# Patient Record
Sex: Male | Born: 1950 | Race: White | Hispanic: No | Marital: Married | Smoking: Never smoker
Health system: Southern US, Community
[De-identification: ages and names within clinical notes are randomized; demographics above are authoritative.]

## PROBLEM LIST (undated history)

## (undated) DIAGNOSIS — B019 Varicella without complication: Secondary | ICD-10-CM

## (undated) DIAGNOSIS — K635 Polyp of colon: Secondary | ICD-10-CM

## (undated) DIAGNOSIS — Z85828 Personal history of other malignant neoplasm of skin: Secondary | ICD-10-CM

## (undated) DIAGNOSIS — C801 Malignant (primary) neoplasm, unspecified: Secondary | ICD-10-CM

## (undated) HISTORY — DX: Varicella without complication: B01.9

## (undated) HISTORY — PX: CIRCUMCISION: SUR203

## (undated) HISTORY — DX: Personal history of other malignant neoplasm of skin: Z85.828

## (undated) HISTORY — DX: Polyp of colon: K63.5

## (undated) HISTORY — PX: APPENDECTOMY: SHX54

## (undated) HISTORY — PX: COLONOSCOPY: SHX174

---

## 2015-08-26 DIAGNOSIS — Z86010 Personal history of colonic polyps: Secondary | ICD-10-CM | POA: Diagnosis not present

## 2015-08-26 DIAGNOSIS — D170 Benign lipomatous neoplasm of skin and subcutaneous tissue of head, face and neck: Secondary | ICD-10-CM | POA: Diagnosis not present

## 2015-08-26 DIAGNOSIS — M62830 Muscle spasm of back: Secondary | ICD-10-CM | POA: Diagnosis not present

## 2015-08-26 DIAGNOSIS — Z8589 Personal history of malignant neoplasm of other organs and systems: Secondary | ICD-10-CM | POA: Diagnosis not present

## 2015-08-26 DIAGNOSIS — A630 Anogenital (venereal) warts: Secondary | ICD-10-CM | POA: Diagnosis not present

## 2015-08-27 DIAGNOSIS — R7989 Other specified abnormal findings of blood chemistry: Secondary | ICD-10-CM | POA: Diagnosis not present

## 2015-08-27 DIAGNOSIS — Z125 Encounter for screening for malignant neoplasm of prostate: Secondary | ICD-10-CM | POA: Diagnosis not present

## 2015-08-27 DIAGNOSIS — E756 Lipid storage disorder, unspecified: Secondary | ICD-10-CM | POA: Diagnosis not present

## 2015-08-27 DIAGNOSIS — Z0000 Encounter for general adult medical examination without abnormal findings: Secondary | ICD-10-CM | POA: Diagnosis not present

## 2015-08-27 DIAGNOSIS — R946 Abnormal results of thyroid function studies: Secondary | ICD-10-CM | POA: Diagnosis not present

## 2015-09-02 DIAGNOSIS — Z0000 Encounter for general adult medical examination without abnormal findings: Secondary | ICD-10-CM | POA: Diagnosis not present

## 2015-09-02 DIAGNOSIS — M62830 Muscle spasm of back: Secondary | ICD-10-CM | POA: Diagnosis not present

## 2015-09-02 DIAGNOSIS — A630 Anogenital (venereal) warts: Secondary | ICD-10-CM | POA: Diagnosis not present

## 2015-09-10 DIAGNOSIS — M545 Low back pain: Secondary | ICD-10-CM | POA: Diagnosis not present

## 2015-09-10 DIAGNOSIS — G8929 Other chronic pain: Secondary | ICD-10-CM | POA: Diagnosis not present

## 2015-09-10 DIAGNOSIS — Z13820 Encounter for screening for osteoporosis: Secondary | ICD-10-CM | POA: Diagnosis not present

## 2015-09-10 DIAGNOSIS — M542 Cervicalgia: Secondary | ICD-10-CM | POA: Diagnosis not present

## 2015-09-10 DIAGNOSIS — M129 Arthropathy, unspecified: Secondary | ICD-10-CM | POA: Diagnosis not present

## 2015-09-17 DIAGNOSIS — M539 Dorsopathy, unspecified: Secondary | ICD-10-CM | POA: Diagnosis not present

## 2015-09-17 DIAGNOSIS — R030 Elevated blood-pressure reading, without diagnosis of hypertension: Secondary | ICD-10-CM | POA: Diagnosis not present

## 2015-11-02 DIAGNOSIS — S0181XA Laceration without foreign body of other part of head, initial encounter: Secondary | ICD-10-CM | POA: Diagnosis not present

## 2015-11-11 DIAGNOSIS — S0181XD Laceration without foreign body of other part of head, subsequent encounter: Secondary | ICD-10-CM | POA: Diagnosis not present

## 2015-11-11 DIAGNOSIS — Z4802 Encounter for removal of sutures: Secondary | ICD-10-CM | POA: Diagnosis not present

## 2016-01-30 DIAGNOSIS — D485 Neoplasm of uncertain behavior of skin: Secondary | ICD-10-CM | POA: Diagnosis not present

## 2016-02-25 DIAGNOSIS — L821 Other seborrheic keratosis: Secondary | ICD-10-CM | POA: Diagnosis not present

## 2016-02-25 DIAGNOSIS — L570 Actinic keratosis: Secondary | ICD-10-CM | POA: Diagnosis not present

## 2016-03-07 DIAGNOSIS — R002 Palpitations: Secondary | ICD-10-CM | POA: Diagnosis not present

## 2016-03-07 DIAGNOSIS — I10 Essential (primary) hypertension: Secondary | ICD-10-CM | POA: Diagnosis not present

## 2016-03-07 DIAGNOSIS — Z79899 Other long term (current) drug therapy: Secondary | ICD-10-CM | POA: Diagnosis not present

## 2016-03-07 DIAGNOSIS — R0789 Other chest pain: Secondary | ICD-10-CM | POA: Diagnosis not present

## 2016-03-07 DIAGNOSIS — Z8249 Family history of ischemic heart disease and other diseases of the circulatory system: Secondary | ICD-10-CM | POA: Diagnosis not present

## 2016-03-07 DIAGNOSIS — R079 Chest pain, unspecified: Secondary | ICD-10-CM | POA: Diagnosis not present

## 2016-03-08 DIAGNOSIS — R002 Palpitations: Secondary | ICD-10-CM | POA: Diagnosis not present

## 2016-03-08 DIAGNOSIS — R079 Chest pain, unspecified: Secondary | ICD-10-CM | POA: Diagnosis not present

## 2016-03-08 DIAGNOSIS — I10 Essential (primary) hypertension: Secondary | ICD-10-CM | POA: Diagnosis not present

## 2016-10-30 DIAGNOSIS — L309 Dermatitis, unspecified: Secondary | ICD-10-CM | POA: Diagnosis not present

## 2017-01-18 ENCOUNTER — Ambulatory Visit (INDEPENDENT_AMBULATORY_CARE_PROVIDER_SITE_OTHER): Payer: PPO | Admitting: Family Medicine

## 2017-01-18 ENCOUNTER — Encounter: Payer: Self-pay | Admitting: Family Medicine

## 2017-01-18 VITALS — BP 132/60 | HR 70 | Temp 98.4°F | Resp 16 | Ht 72.0 in | Wt 233.6 lb

## 2017-01-18 DIAGNOSIS — Z86010 Personal history of colonic polyps: Secondary | ICD-10-CM | POA: Diagnosis not present

## 2017-01-18 DIAGNOSIS — K5900 Constipation, unspecified: Secondary | ICD-10-CM

## 2017-01-18 DIAGNOSIS — R1310 Dysphagia, unspecified: Secondary | ICD-10-CM | POA: Diagnosis not present

## 2017-01-18 NOTE — Patient Instructions (Addendum)
If you do not hear anything about your referral in the next 1-2 weeks, call our office and ask for an update.  Check when you had your last physical and give [place] a call when you are sure it has been a year.   Pneumococcal Conjugate Vaccine (PCV13) What You Need to Know 1. Why get vaccinated? Vaccination can protect both children and adults from pneumococcal disease. Pneumococcal disease is caused by bacteria that can spread from person to person through close contact. It can cause ear infections, and it can also lead to more serious infections of the:  Lungs (pneumonia),  Blood (bacteremia), and  Covering of the brain and spinal cord (meningitis).  Pneumococcal pneumonia is most common among adults. Pneumococcal meningitis can cause deafness and brain damage, and it kills about 1 child in 10 who get it. Anyone can get pneumococcal disease, but children under 73 years of age and adults 11 years and older, people with certain medical conditions, and cigarette smokers are at the highest risk. Before there was a vaccine, the [place] saw:  more than 700 cases of meningitis,  about 13,000 blood infections,  about 5 million ear infections, and  about 200 deaths  in children under 5 each year from pneumococcal disease. Since vaccine became available, severe pneumococcal disease in these children has fallen by 88%. About 18,000 older adults die of pneumococcal disease each year in the [place]. Treatment of pneumococcal infections with penicillin and other drugs is not as effective as it used to be, because some strains of the disease have become resistant to these drugs. This makes prevention of the disease, through vaccination, even more important. 2. PCV13 vaccine Pneumococcal conjugate vaccine (called PCV13) protects against 13 types of pneumococcal bacteria. PCV13 is routinely given to children at 2, 4, 6, and 64-21 months of age. It is also recommended for children and adults 25  to 5 years of age with certain health conditions, and for all adults 46 years of age and older. Your doctor can give you details. 3. Some people should not get this vaccine Anyone who has ever had a life-threatening allergic reaction to a dose of this vaccine, to an earlier pneumococcal vaccine called PCV7, or to any vaccine containing diphtheria toxoid (for example, DTaP), should not get PCV13. Anyone with a severe allergy to any component of PCV13 should not get the vaccine. Tell your doctor if the person being vaccinated has any severe allergies. If the person scheduled for vaccination is not feeling well, your healthcare provider might decide to reschedule the shot on another day. 4. Risks of a vaccine reaction With any medicine, including vaccines, there is a chance of reactions. These are usually mild and go away on their own, but serious reactions are also possible. Problems reported following PCV13 varied by age and dose in the series. The most common problems reported among children were:  About half became drowsy after the shot, had a temporary loss of appetite, or had redness or tenderness where the shot was given.  About 1 out of 3 had swelling where the shot was given.  About 1 out of 3 had a mild fever, and about 1 in 20 had a fever over 102.30F.  Up to about 8 out of 10 became fussy or irritable.  Adults have reported pain, redness, and swelling where the shot was given; also mild fever, fatigue, headache, chills, or muscle pain. Young children who get PCV13 along with inactivated flu vaccine at the  same time may be at increased risk for seizures caused by fever. Ask your doctor for more information. Problems that could happen after any vaccine:  People sometimes faint after a medical procedure, including vaccination. Sitting or lying down for about 15 minutes can help prevent fainting, and injuries caused by a fall. Tell your doctor if you feel dizzy, or have vision changes or  ringing in the ears.  Some older children and adults get severe pain in the shoulder and have difficulty moving the arm where a shot was given. This happens very rarely.  Any medication can cause a severe allergic reaction. Such reactions from a vaccine are very rare, estimated at about 1 in a million doses, and would happen within a few minutes to a few hours after the vaccination. As with any medicine, there is a very small chance of a vaccine causing a serious injury or death. The safety of vaccines is always being monitored. For more information, visit: http://www.aguilar.org/ 5. What if there is a serious reaction? What should I look for? Look for anything that concerns you, such as signs of a severe allergic reaction, very high fever, or unusual behavior. Signs of a severe allergic reaction can include hives, swelling of the face and throat, difficulty breathing, a fast heartbeat, dizziness, and weakness-usually within a few minutes to a few hours after the vaccination. What should I do?  If you think it is a severe allergic reaction or other emergency that can't wait, call 9-1-1 or get the person to the nearest hospital. Otherwise, call your doctor.  Reactions should be reported to the Vaccine Adverse Event Reporting System (VAERS). Your doctor should file this report, or you can do it yourself through the VAERS web site at www.vaers.SamedayNews.es, or by calling 9372370095. ? VAERS does not give medical advice. 6. The National Vaccine Injury Compensation Program The [employer] Vaccine Injury Compensation Program (VICP) is a federal program that was created to compensate people who may have been injured by certain vaccines. Persons who believe they may have been injured by a vaccine can learn about the program and about filing a claim by calling 9797570006 or visiting the [place] website at GoldCloset.com.ee. There is a time limit to file a claim for compensation. 7. How can  I learn more?  Ask your healthcare provider. He or she can give you the vaccine package insert or suggest other sources of information.  Call your local or state health department.  Contact the Centers for Disease Control and Prevention (CDC): ? Call 610 697 5360 (1-800-CDC-INFO) or ? Visit CDC's website at http://hunter.com/ Vaccine Information Statement, PCV13 Vaccine (05/29/2014) This information is not intended to replace advice given to you by your health care provider. Make sure you discuss any questions you have with your health care provider. Document Released: 05/08/2006 Document Revised: 03/31/2016 Document Reviewed: 03/31/2016 Elsevier Interactive Patient Education  2017 [employer].

## 2017-01-18 NOTE — Progress Notes (Signed)
Chief Complaint  Patient presents with  . food has hard time going down  . Constipation       New Patient Visit SUBJECTIVE: HPI: David [name] is an 66 y.o.male who is being seen for establishing care.  The patient was previously seen at an office in [place], cannot remember name of Dr.  Patient has a history of constipation. A couple weeks ago he had difficulty passing stool. He noticed some blood. He started taking some alfalfa supplement and his bowel movements normalize. He has not had any issues with bleeding since then.  Around 1 year ago, the patient was eating roast beef and it got stuck going down. He tried to eat more in drink more but it did not work. He felt like it was stuck right above his stomach. He went to the emergency department and was taken the OR where it was "pushed through". He states that he only have issues if he does not thoroughly chew his food. It does not bother him enough to have any intervention done at this time. It has not been worsening.  The patient also has a history of colon polyps. He was seen approximate 5 years ago and was told that close follow-up. This was never done. He would like to see a gastroenterologist for another colonoscopy. Denies any weight loss or current bleeding from rectum.  Allergies  Allergen Reactions  . Bee Venom Swelling    Past Medical History:  Diagnosis Date  . Blood in stool   . Chicken pox   . Polyp of colon    Past Surgical History:  Procedure Laterality Date  . APPENDECTOMY     Social History   Social History  . Marital status: Married   Social History Main Topics  . Smoking status: Never Smoker  . Smokeless tobacco: Never Used  . Alcohol use Yes     Comment: occasionally  . Drug use: No   Family History  Problem Relation Age of Onset  . Arthritis Mother   . Diabetes Mother   . Heart attack Father   . Heart attack Brother   . Stroke Brother   . Hypertension Brother   . Hypertension Brother       Current Outpatient Prescriptions:  .  ALFALFA PO, Take 1 tablet by mouth daily., Disp: , Rfl:  .  Cod Liver Oil CAPS, Take by mouth., Disp: , Rfl:  .  Coenzyme Q10 100 MG capsule, Take 100 mg by mouth., Disp: , Rfl:  .  EPINEPHrine 0.3 mg/0.3 mL IJ SOAJ injection, Inject into the skin., Disp: , Rfl:  .  Garlic 768 MG TABS, Take 100 mg by mouth., Disp: , Rfl:  .  Multiple Vitamin (MULTI-VITAMINS) TABS, Take by mouth., Disp: , Rfl:   ROS Const: Denies fevers  GI: As noted in HPI   OBJECTIVE: BP 132/60 (BP Location: Left Arm, Cuff Size: Normal)   Pulse 70   Temp 98.4 F (36.9 C) (Oral)   Resp 16   Ht 6' (1.829 m)   Wt 233 lb 9.6 oz (106 kg)   SpO2 98%   BMI 31.68 kg/m   Constitutional: -  VS reviewed -  Well developed, well nourished, appears stated age -  No apparent distress  Psychiatric: -  Oriented to person, place, and time -  Memory intact -  Affect and mood normal -  Fluent conversation, good eye contact -  Judgment and insight age appropriate  Eye: -  Conjunctivae clear, no  discharge -  Pupils symmetric, round, reactive to light  ENMT: -  Ears are patent b/l without erythema or discharge. TM's are shiny and clear b/l without evidence of effusion or infection. -  Oral mucosa without lesions, tongue and uvula midline    Tonsils not enlarged, no erythema, no exudate, trachea midline    Pharynx moist, no lesions, no erythema  Neck: -  No gross swelling, no palpable masses -  Thyroid midline, not enlarged, mobile, no palpable masses  Cardiovascular: -  RRR, no murmurs -  No LE edema  Respiratory: -  Normal respiratory effort, no accessory muscle use, no retraction -  Breath sounds equal, no wheezes, no ronchi, no crackles  Gastrointestinal: -  Bowel sounds normal -  No tenderness, no distention, no guarding, no masses  Skin: -  No significant lesion on inspection -  Warm and dry to palpation   ASSESSMENT/PLAN: History of colon polyps - Plan: Ambulatory  referral to Gastroenterology  Dysphagia, unspecified type  Constipation, unspecified constipation type  Patient instructed to sign release of records form from his previous PCP. We'll refer to gastroenterology for colonoscopy. I would not be referring him were not for his history of colon polyps. If things worsen with his dysphasia, I injected him to notify his gastroenterologist or let us know if he needs a referral. Discussed pneumonia vaccination which he has never had. I gave him some information and he will think about it prior to his physical. Patient should return at earliest convenience for CPE, fasting. The patient voiced understanding and agreement to the plan.   [place], DO 01/18/17  5:10 PM

## 2017-03-23 DIAGNOSIS — D124 Benign neoplasm of descending colon: Secondary | ICD-10-CM | POA: Diagnosis not present

## 2017-03-23 DIAGNOSIS — D125 Benign neoplasm of sigmoid colon: Secondary | ICD-10-CM | POA: Diagnosis not present

## 2017-03-23 DIAGNOSIS — D126 Benign neoplasm of colon, unspecified: Secondary | ICD-10-CM | POA: Diagnosis not present

## 2017-03-23 DIAGNOSIS — D122 Benign neoplasm of ascending colon: Secondary | ICD-10-CM | POA: Diagnosis not present

## 2017-03-23 DIAGNOSIS — Z86010 Personal history of colonic polyps: Secondary | ICD-10-CM | POA: Diagnosis not present

## 2017-03-23 DIAGNOSIS — K641 Second degree hemorrhoids: Secondary | ICD-10-CM | POA: Diagnosis not present

## 2017-06-07 ENCOUNTER — Encounter: Payer: Self-pay | Admitting: Family Medicine

## 2017-06-07 ENCOUNTER — Ambulatory Visit: Payer: PPO | Admitting: Family Medicine

## 2017-06-07 VITALS — BP 132/70 | HR 68 | Temp 98.0°F | Ht 72.0 in | Wt 234.1 lb

## 2017-06-07 DIAGNOSIS — M542 Cervicalgia: Secondary | ICD-10-CM | POA: Diagnosis not present

## 2017-06-07 DIAGNOSIS — K644 Residual hemorrhoidal skin tags: Secondary | ICD-10-CM | POA: Insufficient documentation

## 2017-06-07 MED ORDER — HYDROCORTISONE 2.5 % RE CREA: 1.0000 "application " | TOPICAL_CREAM | Freq: Two times a day (BID) | RECTAL | 0 refills | Status: DC
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
## 2017-06-07 NOTE — Progress Notes (Signed)
Pre visit review using our clinic review tool, if applicable. No additional management support is needed unless otherwise documented below in the visit note. 

## 2017-06-07 NOTE — Patient Instructions (Addendum)
MiraLax 1-2 times daily for the next 3-5 days..  Metamucil/fiber supplements can also be helpful. You must stay well hydrated if you take the fiber supplements.  EXERCISES RANGE OF MOTION (ROM) AND STRETCHING EXERCISES  These exercises may help you when beginning to rehabilitate your issue. In order to successfully resolve your symptoms, you must improve your posture. These exercises are designed to help reduce the forward-head and rounded-shoulder posture which contributes to this condition. Your symptoms may resolve with or without further involvement from your physician, physical therapist or athletic trainer. While completing these exercises, remember:   Restoring tissue flexibility helps normal motion to return to the joints. This allows healthier, less painful movement and activity.  An effective stretch should be held for at least 20 seconds, although you may need to begin with shorter hold times for comfort.  A stretch should never be painful. You should only feel a gentle lengthening or release in the stretched tissue.  Do not do any stretch or exercise that you cannot tolerate.  STRETCH- Axial Extensors  Lie on your back on the floor. You may bend your knees for comfort. Place a rolled-up hand towel or dish towel, about 2 inches in diameter, under the part of your head that makes contact with the floor.  Gently tuck your chin, as if trying to make a "double chin," until you feel a gentle stretch at the base of your head.  Hold 15-20 seconds. Repeat 2-3 times. Complete this exercise 1 time per day.   STRETCH - Axial Extension   Stand or sit on a firm surface. Assume a good posture: chest up, shoulders drawn back, abdominal muscles slightly tense, knees unlocked (if standing) and feet hip width apart.  Slowly retract your chin so your head slides back and your chin slightly lowers. Continue to look straight ahead.  You should feel a gentle stretch in the back of your head. Be  certain not to feel an aggressive stretch since this can cause headaches later.  Hold for 15-20 seconds. Repeat 2-3 times. Complete this exercise 1 time per day.  STRETCH - Cervical Side Bend   Stand or sit on a firm surface. Assume a good posture: chest up, shoulders drawn back, abdominal muscles slightly tense, knees unlocked (if standing) and feet hip width apart.  Without letting your nose or shoulders move, slowly tip your right / left ear to your shoulder until your feel a gentle stretch in the muscles on the opposite side of your neck.  Hold 15-20 seconds. Repeat 2-3 times. Complete this exercise 1-2 times per day.  STRETCH - Cervical Rotators   Stand or sit on a firm surface. Assume a good posture: chest up, shoulders drawn back, abdominal muscles slightly tense, knees unlocked (if standing) and feet hip width apart.  Keeping your eyes level with the ground, slowly turn your head until you feel a gentle stretch along the back and opposite side of your neck.  Hold 15-20 seconds. Repeat 2-3 times. Complete this exercise 1-2 times per day.  RANGE OF MOTION - Neck Circles   Stand or sit on a firm surface. Assume a good posture: chest up, shoulders drawn back, abdominal muscles slightly tense, knees unlocked (if standing) and feet hip width apart.  Gently roll your head down and around from the back of one shoulder to the back of the other. The motion should never be forced or painful.  Repeat the motion 10-20 times, or until you feel the neck muscles  relax and loosen. Repeat 2-3 times. Complete the exercise 1-2 times per day. STRENGTHENING EXERCISES - Cervical Strain and Sprain These exercises may help you when beginning to rehabilitate your injury. They may resolve your symptoms with or without further involvement from your physician, physical therapist, or athletic trainer. While completing these exercises, remember:   Muscles can gain both the endurance and the strength  needed for everyday activities through controlled exercises.  Complete these exercises as instructed by your physician, physical therapist, or athletic trainer. Progress the resistance and repetitions only as guided.  You may experience muscle soreness or fatigue, but the pain or discomfort you are trying to eliminate should never worsen during these exercises. If this pain does worsen, stop and make certain you are following the directions exactly. If the pain is still present after adjustments, discontinue the exercise until you can discuss the trouble with your clinician.  STRENGTH - Cervical Flexors, Isometric  Face a wall, standing about 6 inches away. Place a small pillow, a ball about 6-8 inches in diameter, or a folded towel between your forehead and the wall.  Slightly tuck your chin and gently push your forehead into the soft object. Push only with mild to moderate intensity, building up tension gradually. Keep your jaw and forehead relaxed.  Hold 10 to 20 seconds. Keep your breathing relaxed.  Release the tension slowly. Relax your neck muscles completely before you start the next repetition. Repeat 2-3 times. Complete this exercise 1 time per day.  STRENGTH- Cervical Lateral Flexors, Isometric   Stand about 6 inches away from a wall. Place a small pillow, a ball about 6-8 inches in diameter, or a folded towel between the side of your head and the wall.  Slightly tuck your chin and gently tilt your head into the soft object. Push only with mild to moderate intensity, building up tension gradually. Keep your jaw and forehead relaxed.  Hold 10 to 20 seconds. Keep your breathing relaxed.  Release the tension slowly. Relax your neck muscles completely before you start the next repetition. Repeat 2-3 times. Complete this exercise 1 time per day.  STRENGTH - Cervical Extensors, Isometric   Stand about 6 inches away from a wall. Place a small pillow, a ball about 6-8 inches in  diameter, or a folded towel between the back of your head and the wall.  Slightly tuck your chin and gently tilt your head back into the soft object. Push only with mild to moderate intensity, building up tension gradually. Keep your jaw and forehead relaxed.  Hold 10 to 20 seconds. Keep your breathing relaxed.  Release the tension slowly. Relax your neck muscles completely before you start the next repetition. Repeat 2-3 times. Complete this exercise 1 time per day.  POSTURE AND BODY MECHANICS CONSIDERATIONS Keeping correct posture when sitting, standing or completing your activities will reduce the stress put on different body tissues, allowing injured tissues a chance to heal and limiting painful experiences. The following are general guidelines for improved posture. Your physician or physical therapist will provide you with any instructions specific to your needs. While reading these guidelines, remember:  The exercises prescribed by your provider will help you have the flexibility and strength to maintain correct postures.  The correct posture provides the optimal environment for your joints to work. All of your joints have less wear and tear when properly supported by a spine with good posture. This means you will experience a healthier, less painful body.  Correct posture  must be practiced with all of your activities, especially prolonged sitting and standing. Correct posture is as important when doing repetitive low-stress activities (typing) as it is when doing a single heavy-load activity (lifting).  PROLONGED STANDING WHILE SLIGHTLY LEANING FORWARD When completing a task that requires you to lean forward while standing in one place for a long time, place either foot up on a stationary 2- to 4-inch high object to help maintain the best posture. When both feet are on the ground, the low back tends to lose its slight inward curve. If this curve flattens (or becomes too large), then the  back and your other joints will experience too much stress, fatigue more quickly, and can cause pain.   RESTING POSITIONS Consider which positions are most painful for you when choosing a resting position. If you have pain with flexion-based activities (sitting, bending, stooping, squatting), choose a position that allows you to rest in a less flexed posture. You would want to avoid curling into a fetal position on your side. If your pain worsens with extension-based activities (prolonged standing, working overhead), avoid resting in an extended position such as sleeping on your stomach. Most people will find more comfort when they rest with their spine in a more neutral position, neither too rounded nor too arched. Lying on a non-sagging bed on your side with a pillow between your knees, or on your back with a pillow under your knees will often provide some relief. Keep in mind, being in any one position for a prolonged period of time, no matter how correct your posture, can still lead to stiffness.  WALKING Walk with an upright posture. Your ears, shoulders, and hips should all line up. OFFICE WORK When working at a desk, create an environment that supports good, upright posture. Without extra support, muscles fatigue and lead to excessive strain on joints and other tissues.  CHAIR:  A chair should be able to slide under your desk when your back makes contact with the back of the chair. This allows you to work closely.  The chair's height should allow your eyes to be level with the upper part of your monitor and your hands to be slightly lower than your elbows.  Body position: ? Your feet should make contact with the floor. If this is not possible, use a foot rest. ? Keep your ears over your shoulders. This will reduce stress on your neck and low back.

## 2017-08-09 ENCOUNTER — Ambulatory Visit (INDEPENDENT_AMBULATORY_CARE_PROVIDER_SITE_OTHER): Payer: PPO | Admitting: Family Medicine

## 2017-08-09 ENCOUNTER — Encounter: Payer: Self-pay | Admitting: Family Medicine

## 2017-08-09 VITALS — BP 110/68 | HR 69 | Temp 97.6°F | Ht 72.0 in | Wt 239.4 lb

## 2017-08-09 DIAGNOSIS — D489 Neoplasm of uncertain behavior, unspecified: Secondary | ICD-10-CM | POA: Insufficient documentation

## 2017-08-09 DIAGNOSIS — Z85828 Personal history of other malignant neoplasm of skin: Secondary | ICD-10-CM | POA: Insufficient documentation

## 2017-08-09 DIAGNOSIS — E079 Disorder of thyroid, unspecified: Secondary | ICD-10-CM | POA: Diagnosis not present

## 2017-08-09 DIAGNOSIS — Z1159 Encounter for screening for other viral diseases: Secondary | ICD-10-CM

## 2017-08-09 DIAGNOSIS — Z0000 Encounter for general adult medical examination without abnormal findings: Secondary | ICD-10-CM

## 2017-08-09 DIAGNOSIS — L570 Actinic keratosis: Secondary | ICD-10-CM | POA: Insufficient documentation

## 2017-08-09 LAB — COMPREHENSIVE METABOLIC PANEL
ALT: 26 U/L (ref 0–53)
AST: 19 U/L (ref 0–37)
Albumin: 4.3 g/dL (ref 3.5–5.2)
Alkaline Phosphatase: 71 U/L (ref 39–117)
BUN: 18 mg/dL (ref 6–23)
CHLORIDE: 104 meq/L (ref 96–112)
CO2: 31 meq/L (ref 19–32)
Calcium: 9.5 mg/dL (ref 8.4–10.5)
Creatinine, Ser: 0.87 mg/dL (ref 0.40–1.50)
GFR: 92.81 mL/min (ref >60.00)
GLUCOSE: 114 mg/dL — AB (ref 70–99)
POTASSIUM: 4.6 meq/L (ref 3.5–5.1)
SODIUM: 141 meq/L (ref 135–145)
TOTAL PROTEIN: 7.4 g/dL (ref 6.0–8.3)
Total Bilirubin: 1 mg/dL (ref 0.2–1.2)

## 2017-08-09 LAB — LIPID PANEL
CHOL/HDL RATIO: 4
Cholesterol: 157 mg/dL (ref 0–200)
HDL: 40.2 mg/dL (ref >39.00)
LDL CALC: 96 mg/dL (ref 0–99)
NONHDL: 116.84
Triglycerides: 104 mg/dL (ref 0.0–149.0)
VLDL: 20.8 mg/dL (ref 0.0–40.0)

## 2017-08-09 LAB — T4, FREE: FREE T4: 0.9 ng/dL (ref 0.60–1.60)

## 2017-08-09 LAB — TSH: TSH: 3.8 u[IU]/mL (ref 0.35–4.50)

## 2017-08-09 NOTE — Progress Notes (Signed)
Chief Complaint  Patient presents with  . Follow-up    Well Male David [name] is here for a complete physical.   His last physical was >1 year ago.  Current diet: in general, a "healthy" diet   Current exercise: walking daily Weight trend: stable Does pt snore? Yes. No apneic episodes Daytime fatigue? No. Seat belt? Yes.    Health maintenance Shingrix- No Colonoscopy- Yes - Dr Marin Comment 9/18 Tetanus- Yes Hep C- No  Prostate cancer screening- No Pneumonia vaccine- No - refuses  Past Medical History:  Diagnosis Date  . Chicken pox   . History of skin cancer   . Polyp of colon     Past Surgical History:  Procedure Laterality Date  . APPENDECTOMY     Medications  Current Outpatient Medications on File Prior to Visit  Medication Sig Dispense Refill  . ALFALFA PO Take 1 tablet by mouth daily.    Marland Kitchen Cod Liver Oil CAPS Take by mouth.    . Coenzyme Q10 100 MG capsule Take 100 mg by mouth.    . EPINEPHrine 0.3 mg/0.3 mL IJ SOAJ injection Inject into the skin.    . Garlic 259 MG TABS Take 100 mg by mouth.    . Multiple Vitamin (MULTI-VITAMINS) TABS Take by mouth.     Allergies Allergies  Allergen Reactions  . Bee Venom Swelling   Family History Family History  Problem Relation Age of Onset  . Arthritis Mother   . Diabetes Mother   . Heart attack Father   . Heart attack Brother   . Stroke Brother   . Hypertension Brother   . Hypertension Brother     Review of Systems: Constitutional:  no fevers or chills Eye:  no recent significant change in vision Ear/Nose/Mouth/Throat:  Ears:  no tinnitus or hearing loss Nose/Mouth/Throat:  no complaints of nasal congestion or bleeding, no sore throat and oral sores Cardiovascular:  no chest pain, no palpitations Respiratory:  no cough and no shortness of breath Gastrointestinal:  no abdominal pain, no change in bowel habits, no nausea, vomiting, diarrhea, or constipation and no black or bloody stool GU:  Male: negative for dysuria,  frequency, and incontinence and negative for prostate symptoms Musculoskeletal/Extremities:  no pain, redness, or swelling of the joints Integumentary (Skin): +lesiosn on hands and neck; no abnormal skin lesions reported Neurologic:  no headaches, no numbness, tingling Endocrine:  No unexpected weight changes Hematologic/Lymphatic:  no night sweats  Exam BP 110/68 (BP Location: Left Arm, Patient Position: Sitting, Cuff Size: Large)   Pulse 69   Temp 97.6 F (36.4 C) (Oral)   Ht 6' (1.829 m)   Wt 239 lb 6 oz (108.6 kg)   SpO2 97%   BMI 32.47 kg/m  General:  well developed, well nourished, in no apparent distress Skin: See below; scaly macules on dorsum of hand; otherwise no significant moles, warts, or growths Head:  no masses, lesions, or tenderness Eyes:  pupils equal and round, sclera anicteric without injection Ears:  +cerumen b/l; canals otherwise without lesions, TMs shiny without retraction, no obvious effusion, no erythema Nose:  nares patent, septum midline, mucosa normal Throat/Pharynx:  lips and gingiva without lesion; tongue and uvula midline; non-inflamed pharynx; no exudates or postnasal drainage Neck: neck supple without adenopathy, thyromegaly, or masses Lungs:  clear to auscultation, breath sounds equal bilaterally, no respiratory distress Cardio:  regular rate and rhythm without murmurs, heart sounds without clicks or rubs Abdomen:  abdomen soft, nontender; bowel sounds normal; no  masses or organomegaly Genital (male): circumcised penis, no lesions or discharge; testes present bilaterally without masses or tenderness Rectal: Deferred Musculoskeletal:  symmetrical muscle groups noted without atrophy or deformity Extremities:  no clubbing, cyanosis, or edema, no deformities, no skin discoloration Neuro:  gait normal; deep tendon reflexes normal and symmetric Psych: well oriented with normal range of affect and appropriate judgment/insight  Media  Information     Assessment and Plan  Well adult exam - Plan: Comprehensive metabolic panel, Lipid panel  History of skin cancer  Encounter for hepatitis C screening test for low risk patient - Plan: Hepatitis C antibody  Thyroid dysfunction - Plan: TSH, T4, free  AK (actinic keratosis)  Neoplasm of uncertain behavior   Well 67 y.o. male. Counseled on diet and exercise. Other orders as above.  He has a history of abnormal thyroid testing around 1 year ago and would like to have it checked again today.  No symptoms. He has a history of actinic keratosis as well as skin cancer.  We will biopsy the lesion on his neck and freeze the areas on his hand. He refused the pneumonia vaccine, shingles vaccine, and flu shot. Follow up in 2 weeks for skin procedure, 1 yr for cpe or prn.  The patient voiced understanding and agreement to the plan.  [name], DO 08/09/17 8:16 AM

## 2017-08-09 NOTE — Patient Instructions (Signed)
Keep up the good work.  Try to eat more veggies!  Keep the walking going.  Let us know if you need anything.

## 2017-08-09 NOTE — Progress Notes (Signed)
Pre visit review using our clinic review tool, if applicable. No additional management support is needed unless otherwise documented below in the visit note. 

## 2017-08-10 LAB — HEPATITIS C ANTIBODY
Hepatitis C Ab: NONREACTIVE
SIGNAL TO CUT-OFF: 0.04 (ref <1.00)

## 2017-08-24 ENCOUNTER — Ambulatory Visit (INDEPENDENT_AMBULATORY_CARE_PROVIDER_SITE_OTHER): Payer: PPO | Admitting: Family Medicine

## 2017-08-24 ENCOUNTER — Encounter: Payer: Self-pay | Admitting: Family Medicine

## 2017-08-24 VITALS — BP 120/68 | HR 73 | Temp 98.0°F | Ht 72.0 in | Wt 242.0 lb

## 2017-08-24 DIAGNOSIS — D489 Neoplasm of uncertain behavior, unspecified: Secondary | ICD-10-CM

## 2017-08-24 DIAGNOSIS — L570 Actinic keratosis: Secondary | ICD-10-CM | POA: Diagnosis not present

## 2017-08-24 NOTE — Progress Notes (Signed)
Chief Complaint  Patient presents with  . Procedure   Pt here for skin procedure. +hx of skin cancer.  Scaly spots on head for 2 months, hx of AK. Would like them frozen.  BP 120/68 (BP Location: Left Arm, Patient Position: Sitting, Cuff Size: Large)   Pulse 73   Temp 98 F (36.7 C) (Oral)   Ht 6' (1.829 m)   Wt 242 lb (109.8 kg)   SpO2 97%   BMI 32.82 kg/m  Skin: Posterior R neck, 0.5 cm x 0.3 cm Psych: Age appropriate judgment and insight  Procedure note; shave biopsy Informed consent was obtained. The area was cleaned with alcohol and injected with 0.5 mL of 1% lidocaine with epinephrine. A Dermablade was slightly bent and used to cut under the area of interest. The specimen was placed in a sterile specimen cup and sent to the lab. The area was then cauterized ensuring adequate hemostasis. The area was dressed with triple antibiotic ointment and a bandage. There were no complications noted. The patient tolerated the procedure well.  Procedure note; cryotherapy Verbal consent obtained Areas visualized on scalp Liquid nitrogen used over each area (total of 8) The patient tolerated the procedure well. No immediate complications noted  Neoplasm of uncertain behavior - Plan: Dermatology pathology, PR SHAV SKIN LES < 0.5 CM TRUNK,ARM,LEG  Actinic keratoses - Plan: Dermatology pathology, PR DESTRUC BENIGN/PREMAL,FIRST LESION, PR [place] BENIGN/PREMAL,2-14 LESIONS  Warning s/s's verbalized and written down. F/u in 1 mo for AK's if no better or if new one's arise.  F/u prn for this. Pt voiced understanding and agreement to the plan.  [place], DO 08/24/17 7:17 AM

## 2017-08-24 NOTE — Progress Notes (Signed)
Pre visit review using our clinic review tool, if applicable. No additional management support is needed unless otherwise documented below in the visit note. 

## 2017-08-24 NOTE — Patient Instructions (Addendum)
Do not shower for the rest of the day. When you do wash it, use only soap and water. Do not vigorously scrub. Apply triple antibiotic ointment (like Neosporin) twice daily. Keep the area clean and dry.   Things to look out for: increasing pain not relieved by ibuprofen/acetaminophen, fevers, spreading redness, drainage of pus, or foul odor.  Let us know if you need anything. 

## 2017-08-30 ENCOUNTER — Encounter (HOSPITAL_BASED_OUTPATIENT_CLINIC_OR_DEPARTMENT_OTHER): Payer: Self-pay

## 2017-08-30 ENCOUNTER — Emergency Department (HOSPITAL_BASED_OUTPATIENT_CLINIC_OR_DEPARTMENT_OTHER): Payer: PPO

## 2017-08-30 ENCOUNTER — Other Ambulatory Visit: Payer: Self-pay

## 2017-08-30 ENCOUNTER — Emergency Department (HOSPITAL_BASED_OUTPATIENT_CLINIC_OR_DEPARTMENT_OTHER)
Admission: EM | Admit: 2017-08-30 | Discharge: 2017-08-30 | Disposition: A | Discharge status: Home / Self Care | Payer: PPO | Attending: Physician Assistant | Admitting: Physician Assistant

## 2017-08-30 DIAGNOSIS — R05 Cough: Secondary | ICD-10-CM | POA: Diagnosis not present

## 2017-08-30 DIAGNOSIS — J019 Acute sinusitis, unspecified: Secondary | ICD-10-CM | POA: Diagnosis not present

## 2017-08-30 DIAGNOSIS — Z85828 Personal history of other malignant neoplasm of skin: Secondary | ICD-10-CM | POA: Insufficient documentation

## 2017-08-30 DIAGNOSIS — J3489 Other specified disorders of nose and nasal sinuses: Secondary | ICD-10-CM | POA: Diagnosis not present

## 2017-08-30 DIAGNOSIS — R0981 Nasal congestion: Secondary | ICD-10-CM | POA: Diagnosis not present

## 2017-08-30 DIAGNOSIS — J0110 Acute frontal sinusitis, unspecified: Secondary | ICD-10-CM | POA: Diagnosis not present

## 2017-08-30 DIAGNOSIS — R059 Cough, unspecified: Secondary | ICD-10-CM

## 2017-08-30 MED ORDER — BENZONATATE 100 MG PO CAPS: 100.0000 mg | ORAL_CAPSULE | Freq: Three times a day (TID) | ORAL | 0 refills | Status: DC

## 2017-08-30 MED ORDER — AZITHROMYCIN 250 MG PO TABS: 250.0000 mg | ORAL_TABLET | Freq: Every day | ORAL | 0 refills | Status: DC

## 2017-08-30 NOTE — Discharge Instructions (Signed)
Please read and follow all provided instructions.  Your diagnoses today include:  1. Acute sinusitis, recurrence not specified, unspecified location   2. Cough    Tests performed today include:  Chest x-ray - does not show any pneumonia  Vital signs. See below for your results today.   Medications prescribed:   Azithromycin - antibiotic for respiratory infection  You have been prescribed an antibiotic medicine: take the entire course of medicine even if you are feeling better. Stopping early can cause the antibiotic not to work.   Tessalon Perles - cough suppressant medication  Take any prescribed medications only as directed.  Home care instructions:  Follow any educational materials contained in this packet.  Follow-up instructions: Please follow-up with your primary care provider in the next 3 days for further evaluation of your symptoms and a recheck if you are not feeling better.   Return instructions:   Please return to the Emergency Department if you experience worsening symptoms.  Please return with worsening wheezing, shortness of breath, or difficulty breathing.  Return with persistent fever above 101F.   Please return if you have any other emergent concerns.  Additional Information:  Your vital signs today were: BP (!) 160/83 (BP Location: Left Arm)    Pulse 66    Temp 98.4 F (36.9 C) (Oral)    Resp 18    Ht 6' (1.829 m)    Wt 107 kg (235 lb 14.3 oz)    SpO2 98%    BMI 31.99 kg/m  If your blood pressure (BP) was elevated above 135/85 this visit, please have this repeated by your doctor within one month. --------------

## 2017-08-30 NOTE — ED Provider Notes (Signed)
[facility] EMERGENCY DEPARTMENT Provider Note   CSN: [id number] Arrival date & time: 08/30/17  1647     History   Chief Complaint Chief Complaint  Patient presents with  . Cough    HPI David [name] is a 67 y.o. male.  Patient with no significant past medical history presents to the emergency department with complaint of cough, nasal congestion, runny nose for the past 1 week.  Cough is productive of sputum at times.  He has no chest pain or shortness of breath.  No reported fevers, ear pain.  Occasional sore throat with cough.  Patient is not a smoker and does not have a history of COPD.  He feels "rattling" in his chest that improves with coughing.  No wheezing.  No abdominal pain, nausea or vomiting.  Has used Mucinex without improvement.  Onset of symptoms acute.  Course is persistent.  Nothing makes symptoms better or worse.      Past Medical History:  Diagnosis Date  . Chicken pox   . History of skin cancer   . Polyp of colon     Patient Active Problem List   Diagnosis Date Noted  . 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 Neoplasm of uncertain behavior 78/29/5621  . History of skin cancer   . External hemorrhoid 06/07/2017  . Neck pain 06/07/2017    Past Surgical History:  Procedure Laterality Date  . APPENDECTOMY         Home Medications    Prior to Admission medications   Medication Sig Start Date End Date Taking? Authorizing Provider  ALFALFA PO Take 1 tablet by mouth daily.    [provider]  azithromycin (ZITHROMAX) 250 MG tablet Take 1 tablet (250 mg total) by mouth daily. Take first 2 tablets together, then 1 every day until finished. 08/30/17   Carlisle Cater, PA-C  benzonatate (TESSALON) 100 MG capsule Take 1 capsule (100 mg total) by mouth every 8 (eight) hours. 08/30/17   Carlisle Cater, PA-C  Cod Liver Oil CAPS Take by mouth.    [provider]  Coenzyme Q10 100 MG capsule Take 100 mg by mouth.    [provider]    EPINEPHrine 0.3 mg/0.3 mL IJ SOAJ injection Inject into the skin. 03/09/14   [provider]  Garlic 308 MG TABS Take 100 mg by mouth.    [provider]  Multiple Vitamin (MULTI-VITAMINS) TABS Take by mouth.    [provider]    Family History Family History  Problem Relation Age of Onset  . Arthritis Mother   . Diabetes Mother   . Heart attack Father   . Heart attack Brother   . Stroke Brother   . Hypertension Brother   . Hypertension Brother     Social History Social History   Tobacco Use  . Smoking status: Never Smoker  . Smokeless tobacco: Never Used  Substance Use Topics  . Alcohol use: Yes    Comment: occasionally  . Drug use: No     Allergies   Bee venom   Review of Systems Review of Systems  Constitutional: Negative for chills, fatigue and fever.  HENT: Positive for congestion, rhinorrhea and sinus pressure. Negative for ear pain and sore throat.   Eyes: Negative for redness.  Respiratory: Positive for cough. Negative for shortness of breath and wheezing.   Gastrointestinal: Negative for abdominal pain, diarrhea, nausea and vomiting.  Genitourinary: Negative for dysuria.  Musculoskeletal: Negative for myalgias and neck stiffness.  Skin:  Negative for rash.  Neurological: Negative for headaches.  Hematological: Negative for adenopathy.     Physical Exam Updated Vital Signs BP (!) 160/83 (BP Location: Left Arm)   Pulse 66   Temp 98.4 F (36.9 C) (Oral)   Resp 18   Ht 6' (1.829 m)   Wt 107 kg (235 lb 14.3 oz)   SpO2 98%   BMI 31.99 kg/m   Physical Exam  Constitutional: He appears well-developed and well-nourished.  HENT:  Head: Normocephalic and atraumatic.  Right Ear: Tympanic membrane, external ear and ear canal normal.  Left Ear: Tympanic membrane, external ear and ear canal normal.  Nose: Mucosal edema present. No rhinorrhea. Right sinus exhibits frontal sinus tenderness. Right sinus exhibits no maxillary sinus  tenderness. Left sinus exhibits frontal sinus tenderness. Left sinus exhibits no maxillary sinus tenderness.  Mouth/Throat: Uvula is midline, oropharynx is clear and moist and mucous membranes are normal. Mucous membranes are not dry. No trismus in the jaw. No uvula swelling. No oropharyngeal exudate, posterior oropharyngeal edema, posterior oropharyngeal erythema or tonsillar abscesses.  Eyes: Conjunctivae are normal. Right eye exhibits no discharge. Left eye exhibits no discharge.  Neck: Normal range of motion. Neck supple.  Cardiovascular: Normal rate, regular rhythm and normal heart sounds.  No murmur heard. Pulmonary/Chest: Effort normal. No respiratory distress. He has wheezes. He has no rales.  Mild end expiratory wheezing that clears with cough.  Abdominal: Soft. There is no tenderness. There is no rebound and no guarding.  Musculoskeletal: He exhibits no edema.  Neurological: He is alert.  Skin: Skin is warm and dry.  Psychiatric: He has a normal mood and affect.  Nursing note and vitals reviewed.    ED Treatments / Results  Labs (all labs ordered are listed, but only abnormal results are displayed) Labs Reviewed - No data to display  EKG  EKG Interpretation None       Radiology Dg Chest 2 View  Result Date: 08/30/2017 CLINICAL DATA:  Cough.  Sinus pressure. EXAM: CHEST  2 VIEW COMPARISON:  None. FINDINGS: Deformity of the right chest wall consistent with previous fractures, healed. No pneumothorax. The heart, hila, mediastinum are normal. No nodules or masses. No focal infiltrate. IMPRESSION: No active cardiopulmonary disease. Electronically Signed   By: Dorise Bullion III M.D   On: 08/30/2017 17:40    Procedures Procedures (including critical care time)  Medications Ordered in ED Medications - No data to display   Initial Impression / Assessment and Plan / ED Course  I have reviewed the triage vital signs and the nursing notes.  Pertinent labs & imaging results  that were available during my care of the patient were reviewed by me and considered in my medical decision making (see chart for details).     Patient seen and examined.  Patient with clinical picture suggestive of possible sinusitis or bronchitis.  No fevers.  Given duration of symptoms greater than 1 week, will trial azithromycin and give Tessalon for symptom control.  Patient is in no respiratory distress.  Encourage PCP follow-up for recheck especially if symptoms do not improve in the next 3 days.  Encouraged return to the emergency department with worsening shortness of breath, chest pain, fever, new symptoms or other concerns.  Patient verbalizes understanding and agrees with plan.  Vital signs reviewed and are as follows: BP (!) 160/83 (BP Location: Left Arm)   Pulse 66   Temp 98.4 F (36.9 C) (Oral)   Resp 18   Ht 6' (  1.829 m)   Wt 107 kg (235 lb 14.3 oz)   SpO2 98%   BMI 31.99 kg/m     Final Clinical Impressions(s) / ED Diagnoses   Final diagnoses:  Acute sinusitis, recurrence not specified, unspecified location  Cough   Patient with nasal congestion, sinus pressure, productive cough for about a week without any fevers.  No shortness of breath or chest pain.  No signs of congestive heart failure.  Do not suspect PE or ACS.  Chest x-ray does not show any signs of pneumonia, fluid overload, or other problem.  Treatment as above.  No hypoxia, tachycardia, hypotension.  Feel patient is safe for discharge with treatment with PCP follow-up.   ED Discharge Orders        Ordered    benzonatate (TESSALON) 100 MG capsule  Every 8 hours     08/30/17 1828    azithromycin (ZITHROMAX) 250 MG tablet  Daily     08/30/17 1828       Carlisle Cater, PA-C 08/30/17 1838    Macarthur Critchley, MD 08/31/17 1424

## 2017-08-30 NOTE — ED Triage Notes (Signed)
C/o flu like sx x 1 week-NAD-steady gait 

## 2017-08-31 ENCOUNTER — Other Ambulatory Visit: Payer: Self-pay

## 2017-11-16 ENCOUNTER — Encounter: Payer: Self-pay | Admitting: Family Medicine

## 2017-11-16 ENCOUNTER — Ambulatory Visit (INDEPENDENT_AMBULATORY_CARE_PROVIDER_SITE_OTHER): Payer: PPO | Admitting: Family Medicine

## 2017-11-16 VITALS — BP 110/60 | HR 57 | Temp 98.0°F | Ht 72.0 in | Wt 237.1 lb

## 2017-11-16 DIAGNOSIS — D170 Benign lipomatous neoplasm of skin and subcutaneous tissue of head, face and neck: Secondary | ICD-10-CM

## 2017-11-16 NOTE — Progress Notes (Signed)
Chief Complaint  Patient presents with  . lump on back of head  . Sore Throat    David [name] is a 67 y.o. male here for a skin complaint.  Duration: 11 years, was taken out (lipoma) and started to grow back, most notably over past couple years Location: back of head Pruritic? No Painful? No Drainage? No Other associated symptoms: none  ROS:  Skin: As noted in HPI  Past Medical History:  Diagnosis Date  . Chicken pox   . History of skin cancer   . Polyp of colon    BP 110/60 (BP Location: Left Arm, Patient Position: Sitting, Cuff Size: Large)   Pulse (!) 57   Temp 98 F (36.7 C) (Oral)   Ht 6' (1.829 m)   Wt 237 lb 2 oz (107.6 kg)   SpO2 96%   BMI 32.16 kg/m  Gen: awake, alert, appearing stated age Lungs: No accessory muscle use Skin: See below. Fleshy, well circumscribed lesion. No drainage, erythema, TTP, fluctuance, excoriation Psych: Age appropriate judgment and insight    Back of R head   Document Information  Lipoma of head - Plan: Ambulatory referral to General Surgery  Orders as above. Given size and hx of going under gen anesthesia for this is past, will refer. F/u prn. The patient voiced understanding and agreement to the plan.  [place], DO 11/16/17 8:15 AM

## 2017-11-16 NOTE — Patient Instructions (Addendum)
If you do not hear anything about your referral in the next 1-2 weeks, call our office and ask for an update.  Things to look out for: increasing pain not relieved by ibuprofen/acetaminophen, fevers, spreading redness, drainage of pus, or foul odor.  Let us know if you need anything.  Consider to take something for allergies.

## 2017-11-16 NOTE — Progress Notes (Signed)
Pre visit review using our clinic review tool, if applicable. No additional management support is needed unless otherwise documented below in the visit note. 

## 2017-12-12 ENCOUNTER — Other Ambulatory Visit: Payer: Self-pay | Admitting: Surgery

## 2017-12-12 DIAGNOSIS — R220 Localized swelling, mass and lump, head: Secondary | ICD-10-CM | POA: Diagnosis not present

## 2017-12-25 ENCOUNTER — Other Ambulatory Visit: Payer: Self-pay | Admitting: Surgery

## 2017-12-25 DIAGNOSIS — R220 Localized swelling, mass and lump, head: Secondary | ICD-10-CM | POA: Diagnosis not present

## 2017-12-25 DIAGNOSIS — D170 Benign lipomatous neoplasm of skin and subcutaneous tissue of head, face and neck: Secondary | ICD-10-CM | POA: Diagnosis not present

## 2018-02-16 IMAGING — CR DG CHEST 2V
2 series · 2 of 2 positions shown · non-contrast
Comparison: None.

CLINICAL DATA: Cough.  Sinus pressure.

EXAM:
CHEST  2 VIEW

[w chest pa]
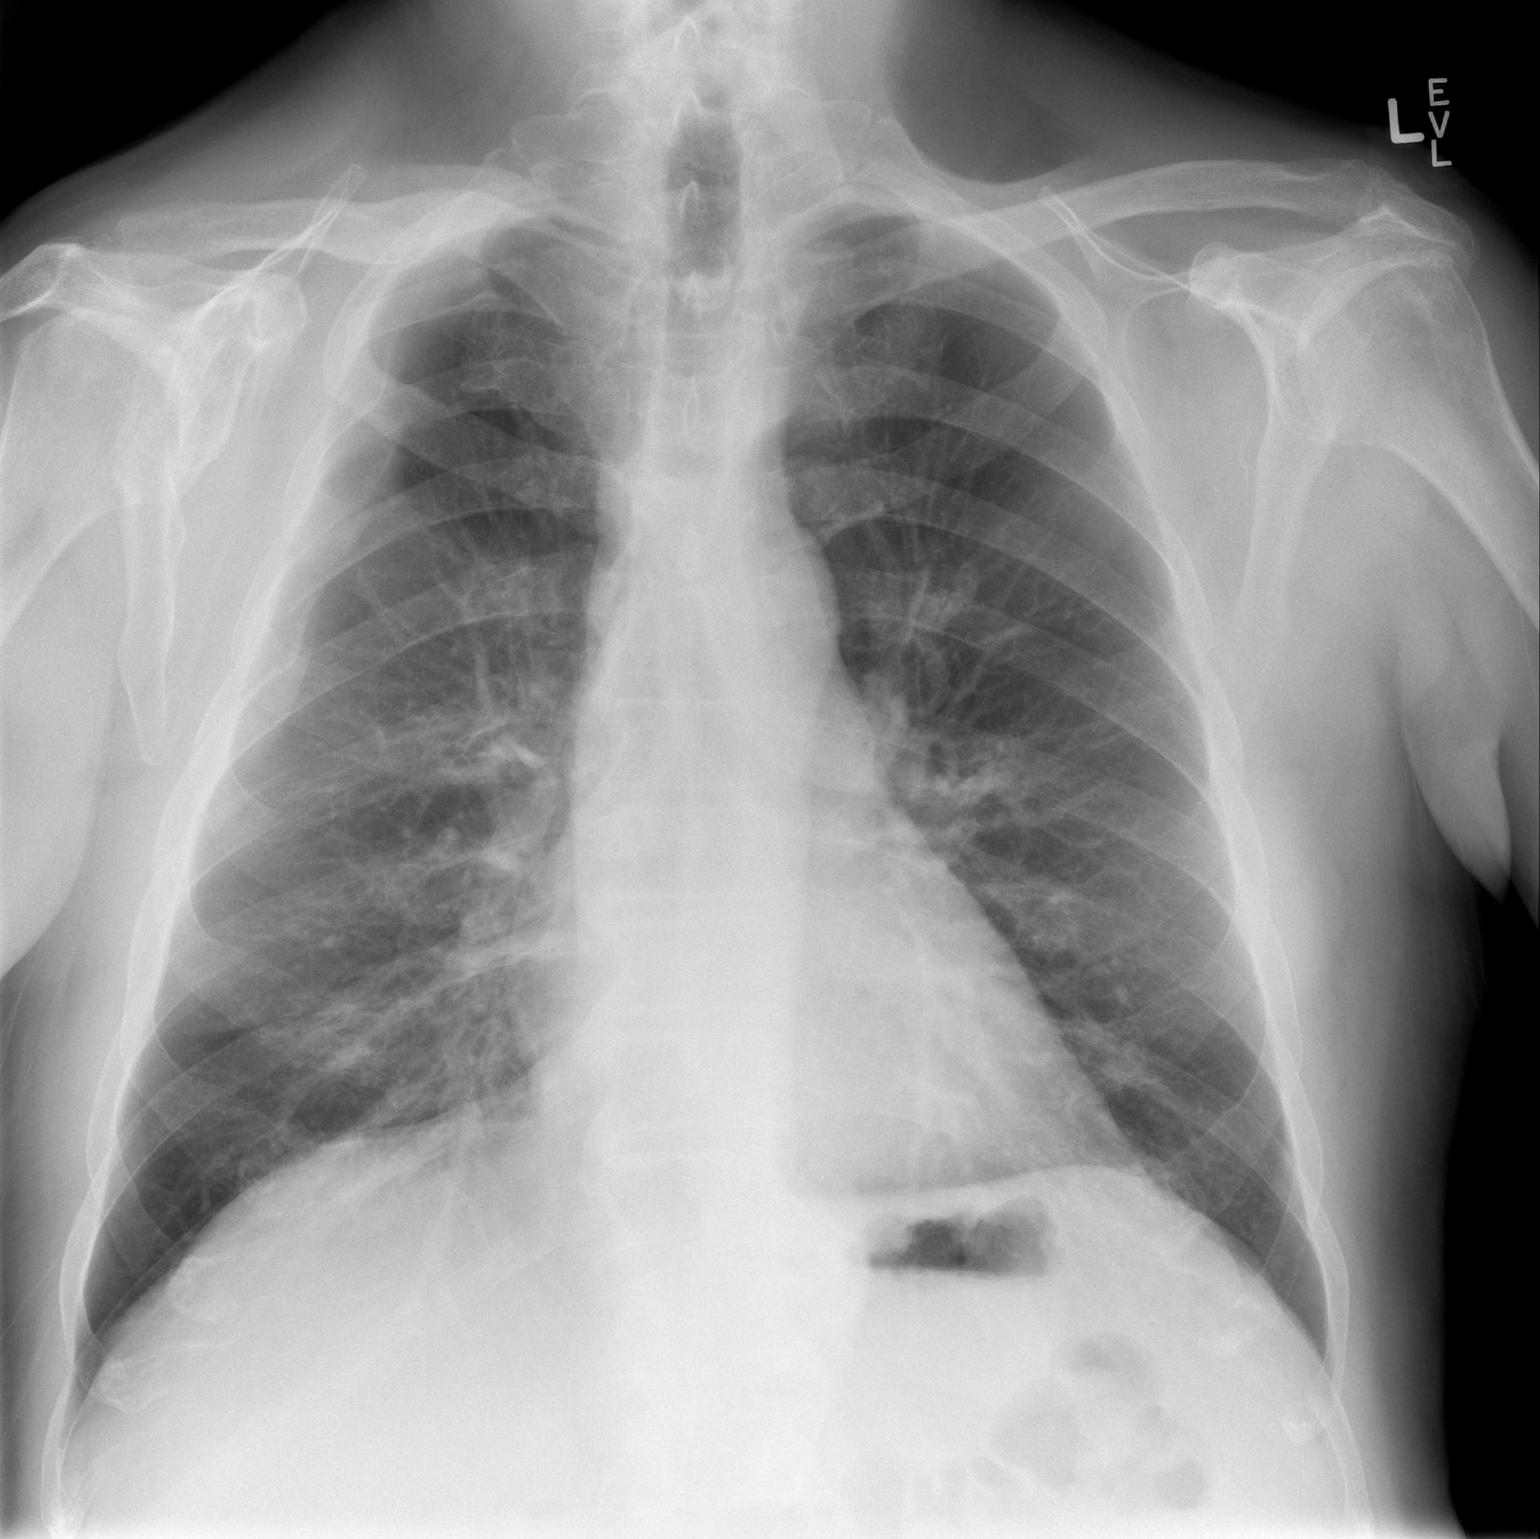

[w chest ap]
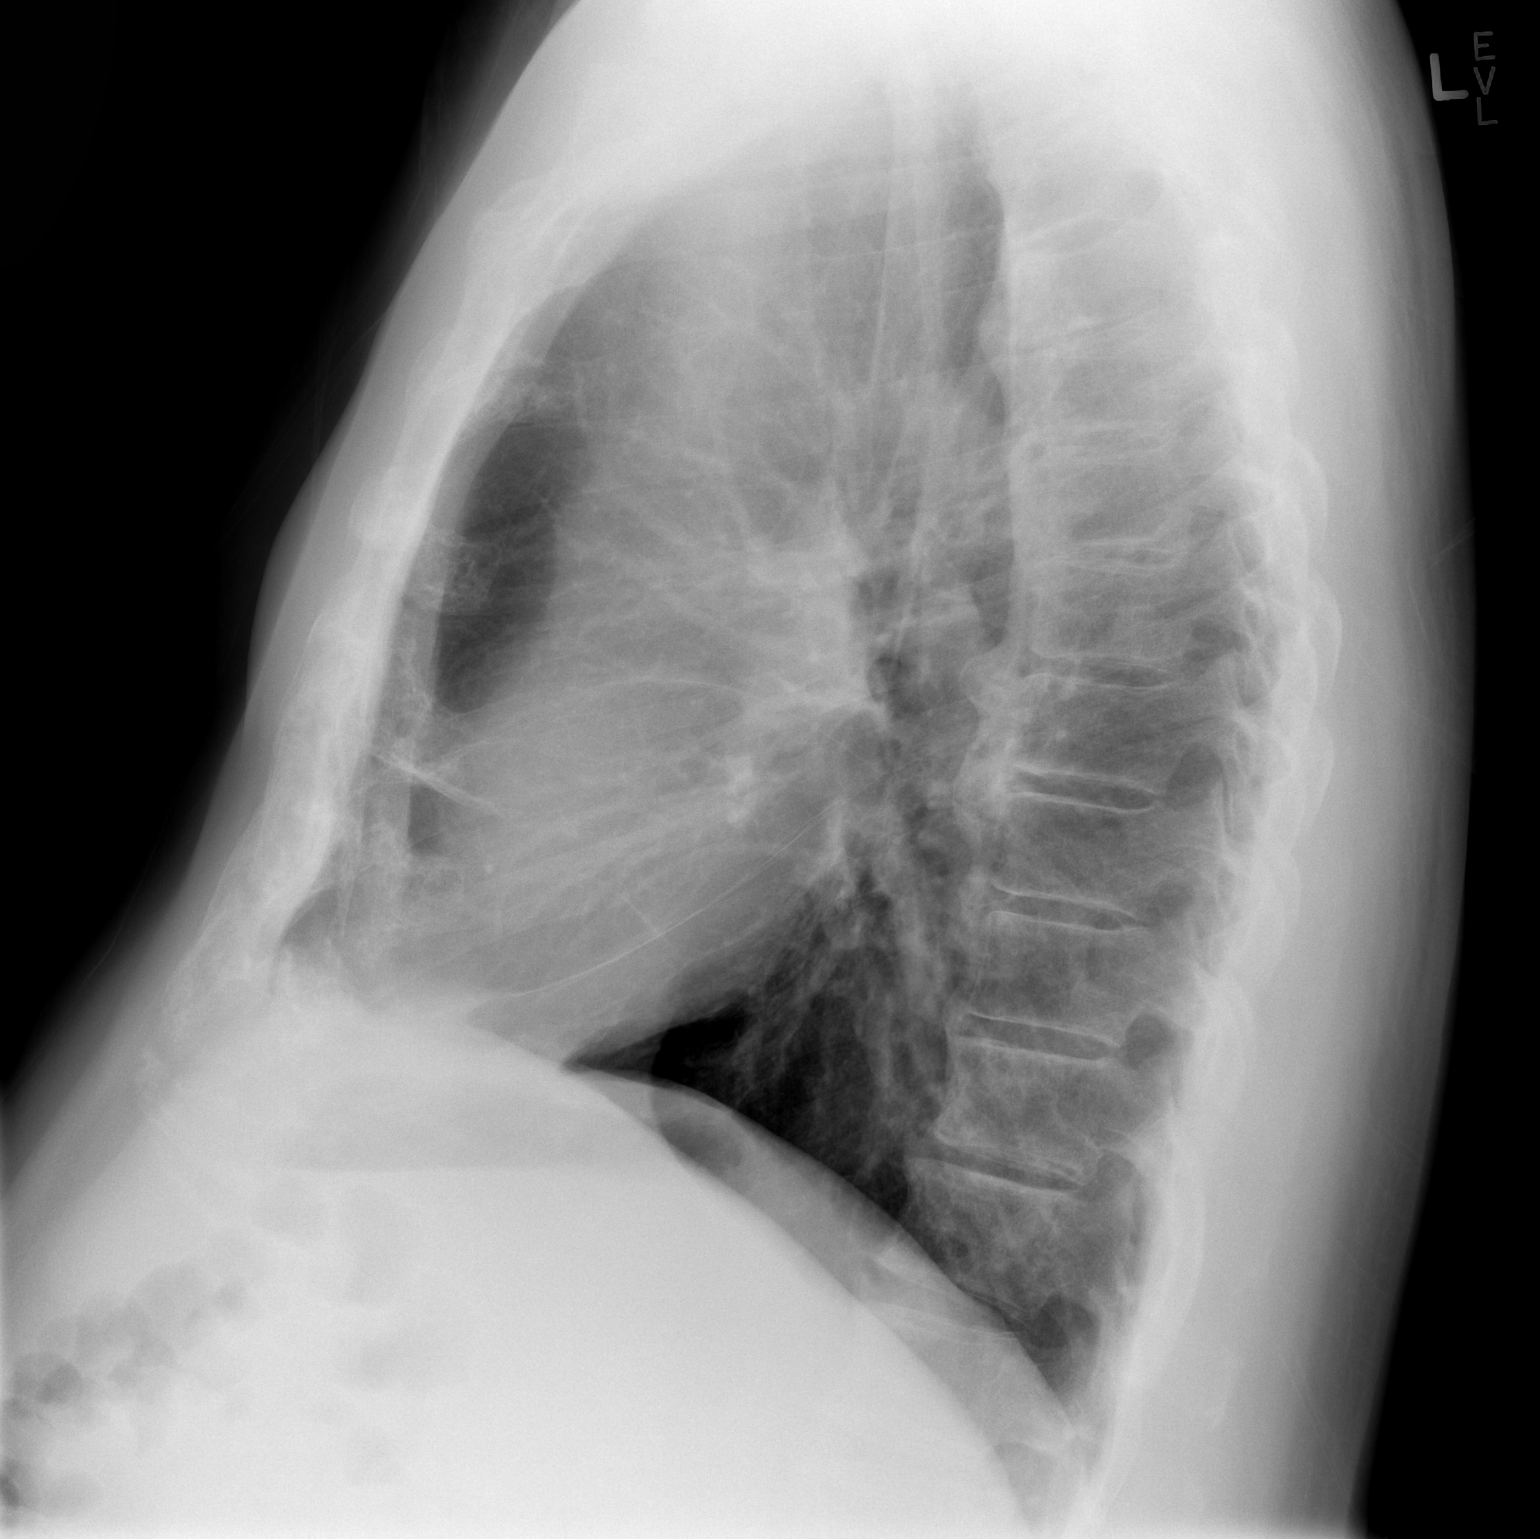

[2 of 2 positions shown; findings below may reference images not displayed]

FINDINGS: Deformity of the right chest wall consistent with previous
fractures, healed. No pneumothorax. The heart, hila, mediastinum are
normal. No nodules or masses. No focal infiltrate.
IMPRESSION: No active cardiopulmonary disease.

## 2018-06-04 ENCOUNTER — Encounter: Payer: Self-pay | Admitting: Family Medicine

## 2018-06-04 ENCOUNTER — Ambulatory Visit (INDEPENDENT_AMBULATORY_CARE_PROVIDER_SITE_OTHER): Payer: PPO | Admitting: Family Medicine

## 2018-06-04 VITALS — BP 144/84 | HR 78 | Temp 97.9°F | Ht 72.0 in | Wt 238.4 lb

## 2018-06-04 DIAGNOSIS — L570 Actinic keratosis: Secondary | ICD-10-CM

## 2018-06-04 NOTE — Progress Notes (Signed)
Pre visit review using our clinic review tool, if applicable. No additional management support is needed unless otherwise documented below in the visit note. 

## 2018-06-04 NOTE — Patient Instructions (Signed)
These will blister, turn dark and hopefully go away. If they don't go away, please let me know.  Wear sunscreen.   Let us know if you need anything.

## 2018-06-04 NOTE — Progress Notes (Signed)
Chief Complaint  Patient presents with  . Follow-up    spots on face    David [name] is a 67 y.o. male here for a skin complaint.  Duration: L face- 2 mo; R face- 4 mo; unsure about hands, several months? Location: face, hands Pruritic? No Painful? No Drainage? No New soaps/lotions/topicals/detergents? No +sun exposure, never wore block while younger  ROS:  Const: No fevers Skin: As noted in HPI  Past Medical History:  Diagnosis Date  . Chicken pox   . History of skin cancer   . Polyp of colon     BP (!) 144/84 (BP Location: Left Arm, Patient Position: Sitting, Cuff Size: Large)   Pulse 78   Temp 97.9 F (36.6 C) (Oral)   Ht 6' (1.829 m)   Wt 238 lb 6 oz (108.1 kg)   SpO2 97%   BMI 32.33 kg/m  Gen: awake, alert, appearing stated age Lungs: No accessory muscle use Skin: See below. No drainage, erythema, TTP, fluctuance, excoriation Psych: Age appropriate judgment and insight          Procedure note: cryotherapy Verbal consent obtained 22 skin lesions treated (4 on L face, 4 on R face, 9 on L hand, 5 on R) Liquid nitrogen was applied via a thin spray creating an ice ball with 1-2 mm corona surrounding the lesion The patient tolerated the procedure well There were no immediate complications noted   Actinic keratoses - Plan: PR DESTRUCTION BENIGN LESIONS UP TO 14  Orders as above. F/u prn. The patient voiced understanding and agreement to the plan.  [place], DO 06/04/18 2:33 PM

## 2018-07-23 ENCOUNTER — Ambulatory Visit (INDEPENDENT_AMBULATORY_CARE_PROVIDER_SITE_OTHER): Payer: PPO

## 2018-07-23 DIAGNOSIS — Z23 Encounter for immunization: Secondary | ICD-10-CM

## 2018-08-15 ENCOUNTER — Encounter: Payer: Self-pay | Admitting: Family Medicine

## 2018-08-15 ENCOUNTER — Other Ambulatory Visit: Payer: Self-pay | Admitting: Family Medicine

## 2018-08-15 ENCOUNTER — Ambulatory Visit (INDEPENDENT_AMBULATORY_CARE_PROVIDER_SITE_OTHER): Payer: PPO | Admitting: Family Medicine

## 2018-08-15 VITALS — BP 142/72 | HR 68 | Temp 97.7°F | Ht 72.0 in | Wt 241.5 lb

## 2018-08-15 DIAGNOSIS — R739 Hyperglycemia, unspecified: Secondary | ICD-10-CM

## 2018-08-15 DIAGNOSIS — R0681 Apnea, not elsewhere classified: Secondary | ICD-10-CM | POA: Diagnosis not present

## 2018-08-15 DIAGNOSIS — Z0000 Encounter for general adult medical examination without abnormal findings: Secondary | ICD-10-CM

## 2018-08-15 DIAGNOSIS — Z23 Encounter for immunization: Secondary | ICD-10-CM

## 2018-08-15 DIAGNOSIS — D489 Neoplasm of uncertain behavior, unspecified: Secondary | ICD-10-CM

## 2018-08-15 DIAGNOSIS — L570 Actinic keratosis: Secondary | ICD-10-CM | POA: Diagnosis not present

## 2018-08-15 LAB — COMPREHENSIVE METABOLIC PANEL
ALBUMIN: 4.1 g/dL (ref 3.5–5.2)
ALT: 22 U/L (ref 0–53)
AST: 20 U/L (ref 0–37)
Alkaline Phosphatase: 70 U/L (ref 39–117)
BUN: 13 mg/dL (ref 6–23)
CHLORIDE: 104 meq/L (ref 96–112)
CO2: 29 mEq/L (ref 19–32)
Calcium: 9.5 mg/dL (ref 8.4–10.5)
Creatinine, Ser: 0.88 mg/dL (ref 0.40–1.50)
GFR: 85.92 mL/min (ref >60.00)
Glucose, Bld: 114 mg/dL — ABNORMAL HIGH (ref 70–99)
POTASSIUM: 4.2 meq/L (ref 3.5–5.1)
Sodium: 140 mEq/L (ref 135–145)
TOTAL PROTEIN: 6.9 g/dL (ref 6.0–8.3)
Total Bilirubin: 0.8 mg/dL (ref 0.2–1.2)

## 2018-08-15 LAB — LIPID PANEL
CHOLESTEROL: 152 mg/dL (ref 0–200)
HDL: 38.2 mg/dL — AB (ref >39.00)
LDL CALC: 85 mg/dL (ref 0–99)
NonHDL: 114.19
TRIGLYCERIDES: 148 mg/dL (ref 0.0–149.0)
Total CHOL/HDL Ratio: 4
VLDL: 29.6 mg/dL (ref 0.0–40.0)

## 2018-08-15 NOTE — Progress Notes (Signed)
Pre visit review using our clinic review tool, if applicable. No additional management support is needed unless otherwise documented below in the visit note. 

## 2018-08-15 NOTE — Patient Instructions (Addendum)
Give [place] 2-3 business days to get the results of your labs back.   Keep the diet clean and stay active.  The new Shingrix vaccine (for shingles) is a 2 shot series. It can make people feel low energy, achy and almost like they have the flu for 48 hours after injection. Please plan accordingly when deciding on when to get this shot. Call our office for a nurse visit appointment to get this. The second shot of the series is less severe regarding the side effects, but it still lasts 48 hours.   Do not shower for the rest of the day. When you do wash it, use only soap and water. Do not vigorously scrub. Apply triple antibiotic ointment (like Neosporin) twice daily. Keep the area clean and dry.   Things to look out for: increasing pain not relieved by ibuprofen/acetaminophen, fevers, spreading redness, drainage of pus, or foul odor.  Give [place] 1 business week to get results of your biopsy back.  If you do not hear anything about your referral in the next 1-2 weeks, call our office and ask for an update.  Let us know if you need anything.

## 2018-08-15 NOTE — Progress Notes (Signed)
Chief Complaint  Patient presents with  . Annual Exam    Well Male David [name] is here for a complete physical.   His last physical was >1 year ago.  Current diet: in general, a "OK diet.   Current exercise: none Weight trend: stable  Snoring? Yes, +witnessed apneic episodes Daytime fatigue? No. Seat belt? Yes.    Health maintenance Shingrix- No Colonoscopy- Yes Tetanus- Yes Hep C- Yes Prostate cancer screening- No Pneumonia vaccine- No  Past Medical History:  Diagnosis Date  . Chicken pox   . History of skin cancer   . Polyp of colon      Past Surgical History:  Procedure Laterality Date  . APPENDECTOMY      Medications  Current Outpatient Medications on File Prior to Visit  Medication Sig Dispense Refill  . ALFALFA PO Take 1 tablet by mouth daily.    Marland Kitchen Cod Liver Oil CAPS Take by mouth.    . Coenzyme Q10 100 MG capsule Take 100 mg by mouth.    . EPINEPHrine 0.3 mg/0.3 mL IJ SOAJ injection Inject into the skin.    . Garlic 277 MG TABS Take 100 mg by mouth.    . Multiple Vitamin (MULTI-VITAMINS) TABS Take by mouth.      Allergies Allergies  Allergen Reactions  . Bee Venom Swelling    Family History Family History  Problem Relation Age of Onset  . Arthritis Mother   . Diabetes Mother   . Heart attack Father   . Heart attack Brother   . Stroke Brother   . Hypertension Brother   . Hypertension Brother     Review of Systems: Constitutional:  no fevers or chills Eye:  no recent significant change in vision Ear/Nose/Mouth/Throat:  Ears:  no recent hearing loss Nose/Mouth/Throat:  no complaints of nasal congestion or sore throat Cardiovascular:  no chest pain, no palpitations Respiratory:  no cough and no shortness of breath Gastrointestinal:  no abdominal pain, no change in bowel habits GU:  Male: negative for dysuria, frequency, and incontinence and negative for prostate symptoms Musculoskeletal/Extremities:  no pain, redness, or swelling of the  joints Integumentary (Skin):  no abnormal skin lesions reported Neurologic:  no headaches, Endocrine:  No unexpected weight changes Hematologic/Lymphatic:  no areas of easy bruising  Exam BP (!) 142/72 (BP Location: Left Arm, Patient Position: Sitting, Cuff Size: Normal)   Pulse 68   Temp 97.7 F (36.5 C) (Oral)   Ht 6' (1.829 m)   Wt 241 lb 8 oz (109.5 kg)   SpO2 98%   BMI 32.75 kg/m  General:  well developed, well nourished, in no apparent distress Skin: scaly skin lesion on zygomatic region on L still present- approx 0.4 cm in diameter; otherwise no significant moles, warts, or growths Head:  no masses, lesions, or tenderness Eyes:  pupils equal and round, sclera anicteric without injection Ears:  canals without lesions, TMs shiny without retraction, no obvious effusion, no erythema Nose:  nares patent, septum midline, mucosa normal Throat/Pharynx:  lips and gingiva without lesion; tongue and uvula midline; non-inflamed pharynx; no exudates or postnasal drainage Neck: neck supple without adenopathy, thyromegaly, or masses Lungs:  clear to auscultation, breath sounds equal bilaterally, no respiratory distress Cardio:  regular rate and rhythm, no LE edema or bruits Abdomen:  abdomen soft, nontender; bowel sounds normal; no masses or organomegaly Genital (male): circumcised penis, no lesions or discharge; testes present bilaterally without masses or tenderness Rectal: Deferred Musculoskeletal:  symmetrical muscle groups  noted without atrophy or deformity Extremities:  no clubbing, cyanosis, or edema, no deformities, no skin discoloration Neuro:  gait normal; deep tendon reflexes normal and symmetric Psych: well oriented with normal range of affect and appropriate judgment/insight  Procedure note; shave biopsy Informed consent was obtained. The area was cleaned with alcohol and injected with 0.25 mL of 1% lidocaine with epinephrine. A Dermablade was slightly bent and used to cut  under the area of interest. The specimen was placed in a sterile specimen cup and sent to the lab. The area was then cauterized ensuring adequate hemostasis. The area was dressed with triple antibiotic ointment and a bandage. There were no complications noted. The patient tolerated the procedure well.   Assessment and Plan  Well adult exam - Plan: Comprehensive metabolic panel, Lipid panel  Neoplasm of uncertain behavior - Plan: Dermatology pathology([place])  Witnessed apneic spells - Plan: Ambulatory referral to Pulmonology  Need for vaccination against Streptococcus pneumoniae - Plan: Pneumococcal polysaccharide vaccine 23-valent greater than or equal to 2yo subcutaneous/IM   Well 68 y.o. male. Counseled on diet and exercise. Counseled on risks and benefits of prostate cancer screening. He agrees to forego screening. Shingrix info given. Aftercare instructions given for skin procedure. 1 week for bx to return. Failed cryotherapy. Other orders as above. Follow up in 1 yr.  The patient voiced understanding and agreement to the plan.  [place], DO 08/15/18 7:40 AM

## 2018-08-16 ENCOUNTER — Other Ambulatory Visit (INDEPENDENT_AMBULATORY_CARE_PROVIDER_SITE_OTHER): Payer: PPO

## 2018-08-16 DIAGNOSIS — R739 Hyperglycemia, unspecified: Secondary | ICD-10-CM | POA: Diagnosis not present

## 2018-08-16 LAB — HEMOGLOBIN A1C: HEMOGLOBIN A1C: 5.4 % (ref 4.6–6.5)

## 2018-08-19 ENCOUNTER — Emergency Department (HOSPITAL_BASED_OUTPATIENT_CLINIC_OR_DEPARTMENT_OTHER)
Admission: EM | Admit: 2018-08-19 | Discharge: 2018-08-19 | Disposition: A | Discharge status: Home / Self Care | Payer: PPO | Attending: Emergency Medicine | Admitting: Emergency Medicine

## 2018-08-19 ENCOUNTER — Other Ambulatory Visit: Payer: Self-pay

## 2018-08-19 ENCOUNTER — Encounter (HOSPITAL_BASED_OUTPATIENT_CLINIC_OR_DEPARTMENT_OTHER): Payer: Self-pay | Admitting: Emergency Medicine

## 2018-08-19 DIAGNOSIS — M79674 Pain in right toe(s): Secondary | ICD-10-CM

## 2018-08-19 DIAGNOSIS — Z79899 Other long term (current) drug therapy: Secondary | ICD-10-CM | POA: Diagnosis not present

## 2018-08-19 DIAGNOSIS — M79675 Pain in left toe(s): Secondary | ICD-10-CM | POA: Insufficient documentation

## 2018-08-19 MED ORDER — GABAPENTIN 300 MG PO CAPS: 300.0000 mg | ORAL_CAPSULE | Freq: Three times a day (TID) | ORAL | 0 refills | Status: DC

## 2018-08-19 NOTE — ED Provider Notes (Signed)
[facility] EMERGENCY DEPARTMENT Provider Note   CSN: [id number] Arrival date & time: 08/19/18  1246     History   Chief Complaint Chief Complaint  Patient presents with  . Toe Pain    HPI David [name] is a 68 y.o. male.  HPI   68 year old male with intermittent sharp, shooting pain in his left second toe.  He was woken up with this pain this morning.  Is been intermittent throughout the day.  He has not noticed anything that seems to bring it on.  Denies any other acute pain elsewhere.  Denies any recent trauma or strain.  No numbness or tingling.  No history of similar symptoms.  Past Medical History:  Diagnosis Date  . Chicken pox   . History of skin cancer   . Polyp of colon     Patient Active Problem List   Diagnosis Date Noted  . 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 Neoplasm of uncertain behavior 78/29/5621  . History of skin cancer   . External hemorrhoid 06/07/2017  . Neck pain 06/07/2017    Past Surgical History:  Procedure Laterality Date  . APPENDECTOMY          Home Medications    Prior to Admission medications   Medication Sig Start Date End Date Taking? Authorizing Provider  ALFALFA PO Take 1 tablet by mouth daily.    [provider]  [facility] Liver Oil CAPS Take by mouth.    [provider]  Coenzyme Q10 100 MG capsule Take 100 mg by mouth.    [provider]  EPINEPHrine 0.3 mg/0.3 mL IJ SOAJ injection Inject into the skin. 03/09/14   [provider]  gabapentin (NEURONTIN) 300 MG capsule Take 1 capsule (300 mg total) by mouth 3 (three) times daily. 08/19/18   Virgel Manifold, MD  Garlic 308 MG TABS Take 100 mg by mouth.    [provider]  Multiple Vitamin (MULTI-VITAMINS) TABS Take by mouth.    [provider]    Family History Family History  Problem Relation Age of Onset  . Arthritis Mother   . Diabetes Mother   . Heart attack Father   . Heart attack Brother   . Stroke Brother   .  Hypertension Brother   . Hypertension Brother     Social History Social History   Tobacco Use  . Smoking status: Never Smoker  . Smokeless tobacco: Never Used  Substance Use Topics  . Alcohol use: Yes    Comment: occasionally  . Drug use: No     Allergies   Bee venom   Review of Systems Review of Systems  All systems reviewed and negative, other than as noted in HPI.  Physical Exam Updated Vital Signs BP 140/78 (BP Location: Left Arm)   Pulse 97   Temp 98.1 F (36.7 C) (Oral)   Resp 18   Ht 6' (1.829 m)   Wt 108.9 kg   SpO2 100%   BMI 32.55 kg/m   Physical Exam Vitals signs and nursing note reviewed.  Constitutional:      General: He is not in acute distress.    Appearance: He is well-developed.  HENT:     Head: Normocephalic and atraumatic.  Eyes:     General:        Right eye: No discharge.        Left eye: No discharge.     Conjunctiva/sclera: Conjunctivae normal.  Neck:     Musculoskeletal: Neck supple.  Cardiovascular:     Rate and Rhythm: Normal rate and regular rhythm.     Heart sounds: Normal heart sounds. No murmur. No friction rub. No gallop.   Pulmonary:     Effort: Pulmonary effort is normal. No respiratory distress.     Breath sounds: Normal breath sounds.  Abdominal:     General: There is no distension.     Palpations: Abdomen is soft.     Tenderness: There is no abdominal tenderness.  Musculoskeletal:        General: No tenderness.     Comments: Lower extremities/feet are normal in appearance.  Pain is nonreproducible with palpation or range of motion.  There is no swelling, erythema concerning skin lesions.  Sensation is intact light touch.  He has brisk cap refill in the tip of the toe.  Skin:    General: Skin is warm and dry.  Neurological:     Mental Status: He is alert.  Psychiatric:        Behavior: Behavior normal.        Thought Content: Thought content normal.      ED Treatments / Results  Labs (all labs ordered  are listed, but only abnormal results are displayed) Labs Reviewed - No data to display  EKG None  Radiology No results found.  Procedures Procedures (including critical care time)  Medications Ordered in ED Medications - No data to display   Initial Impression / Assessment and Plan / ED Course  I have reviewed the triage vital signs and the nursing notes.  Pertinent labs & imaging results that were available during my care of the patient were reviewed by me and considered in my medical decision making (see chart for details).     67yM with very brief intermittent shooting pain into L second toe only. Exam unremarkable. Small peripheral nerve irritation? No swelling/skin lesions. No trauma. NVI on exam. Not reproducible. Will try gabapentin. Return precautions discussed. Outpt FU otherwise.   Final Clinical Impressions(s) / ED Diagnoses   Final diagnoses:  Pain of toe of right foot    ED Discharge Orders         Ordered    gabapentin (NEURONTIN) 300 MG capsule  3 times daily     08/19/18 1503           Virgel Manifold, MD 08/29/18 1213

## 2018-08-19 NOTE — ED Triage Notes (Signed)
Patient states that he woke up this am with shooting pain to his left 2nd toe  - patient states that he also has a stuffy nose and congestion

## 2018-08-20 ENCOUNTER — Other Ambulatory Visit: Payer: Self-pay

## 2018-08-20 NOTE — Patient Outreach (Signed)
[facility] [place]) Care Management  08/20/2018  David [name] June 20, 1951 [id number]   Telephone Screen  Referral Date: 08/20/2018 Referral Source: Nurse Call Center Referral Reason: " 08/19/2018-11:32 am-caller states he has a cold, runny nose, cough and denies fever, caller woke up and right toe beside his big toe shooting tingling feeling every 2-3 mins" Insurance: H[employer]   Outreach attempt # 1 to patient. Spoke with patient who voices that he is feeling better today. He took advice from nurse at call center and got medical attention at ED. He reports MD told him it was "a nerve thing." Patient was given prescription to help with nerve pain. He voices that he has not gotten it filled as he does not like to take meds. He does not feel like he needs medication to treat it at this time. He is aware that if pain gets worsen he should follow up with MD. Patient states that he took some Ibuprofen last night for his cold and he feels better today. RN CM confirmed with patient that he has gotten flu and PNA vaccine. He is aware of worsening s/s and when to seek medical attention. He voices no further RN CM needs or concerns. Patient appreciative of follow up call.      Plan: RN CM will close case as no further interventions needed at this time.    Enzo Montgomery, RN,BSN,CCM [facility] Telephonic Care Management Coordinator Direct Phone: [phone] Toll Free: [phone] Fax: [phone]

## 2018-08-21 ENCOUNTER — Telehealth: Payer: Self-pay | Admitting: *Deleted

## 2018-08-21 NOTE — Telephone Encounter (Signed)
Received Dermatopathology Report results from [facility]; forwarded to provider/SLS 01/28

## 2019-01-07 ENCOUNTER — Telehealth: Payer: Self-pay

## 2019-01-07 ENCOUNTER — Telehealth: Payer: Self-pay | Admitting: Family Medicine

## 2019-01-07 NOTE — Telephone Encounter (Signed)
Please reach out to pt regarding this. We are happy to evaluate his situation if he would like. Concern was over his BP. Ty.

## 2019-01-07 NOTE — Telephone Encounter (Signed)
FYI

## 2019-01-07 NOTE — Telephone Encounter (Signed)
Copied from [place] [id number]. Topic: General - Other >> Jan 07, 2019 11:10 AM Carolyn Stare wrote: NP with [facility] req a call back to discuss virtual visit she had with pt

## 2019-01-07 NOTE — Telephone Encounter (Signed)
Is there any contact information?

## 2019-01-07 NOTE — Telephone Encounter (Signed)
Copied from [place] [id number]. Topic: Quick Communication - Home Health Verbal Orders >> Jan 07, 2019  2:18 PM Scherrie Gerlach wrote: Caller/Agency: Jess Barters / nurse practioner with [facility] Callback Number:  [phone] Reports todays 158/84  HR 105 today  other days bp elevated Pt having AFIB issues took hisself off eta locker and medicating himself.  Taking cod liver oil for his bp  Roland Rack advised pt to take his bp several times a day and HR. Keep a log. May need to contact pt  This is a follow up call from previous note sent with no info

## 2019-01-08 NOTE — Telephone Encounter (Signed)
appt scheduled for 3:15 01/09/2019

## 2019-01-09 ENCOUNTER — Other Ambulatory Visit: Payer: Self-pay

## 2019-01-09 ENCOUNTER — Ambulatory Visit (INDEPENDENT_AMBULATORY_CARE_PROVIDER_SITE_OTHER): Payer: PPO | Admitting: Family Medicine

## 2019-01-09 ENCOUNTER — Encounter: Payer: Self-pay | Admitting: Family Medicine

## 2019-01-09 VITALS — BP 156/88 | HR 88 | Temp 97.8°F | Ht 72.0 in | Wt 246.0 lb

## 2019-01-09 DIAGNOSIS — I499 Cardiac arrhythmia, unspecified: Secondary | ICD-10-CM

## 2019-01-09 DIAGNOSIS — R030 Elevated blood-pressure reading, without diagnosis of hypertension: Secondary | ICD-10-CM

## 2019-01-09 MED ORDER — METOPROLOL SUCCINATE ER 50 MG PO TB24: 50.0000 mg | ORAL_TABLET | Freq: Every day | ORAL | 3 refills | Status: DC

## 2019-01-09 NOTE — Patient Instructions (Addendum)
Your EKG shows early heart beats that are not worrisome.   Keep the diet clean and stay active.  Keep checking your blood pressure at home.  Healthy Eating Plan Many factors influence your heart health, including eating and exercise habits. Heart (coronary) risk increases with abnormal blood fat (lipid) levels. Heart-healthy meal planning includes limiting unhealthy fats, increasing healthy fats, and making other small dietary changes. This includes maintaining a healthy body weight to help keep lipid levels within a normal range.  WHAT IS MY PLAN?  Your health care provider recommends that you:  Drink a glass of water before meals to help with satiety.  Eat slowly.  An alternative to the water is to add Metamucil. This will help with satiety as well. It does contain calories, unlike water.  WHAT TYPES OF FAT SHOULD I CHOOSE?  Choose healthy fats more often. Choose monounsaturated and polyunsaturated fats, such as olive oil and canola oil, flaxseeds, walnuts, almonds, and seeds.  Eat more omega-3 fats. Good choices include salmon, mackerel, sardines, tuna, flaxseed oil, and ground flaxseeds. Aim to eat fish at least two times each week.  Avoid foods with partially hydrogenated oils in them. These contain trans fats. Examples of foods that contain trans fats are stick margarine, some tub margarines, cookies, crackers, and other baked goods. If you are going to avoid a fat, this is the one to avoid!  WHAT GENERAL GUIDELINES DO I NEED TO FOLLOW?  Check food labels carefully to identify foods with trans fats. Avoid these types of options when possible.  Fill one half of your plate with vegetables and green salads. Eat 4-5 servings of vegetables per day. A serving of vegetables equals 1 cup of raw leafy vegetables,  cup of raw or cooked cut-up vegetables, or  cup of vegetable juice.  Fill one fourth of your plate with whole grains. Look for the word "whole" as the first word in the  ingredient list.  Fill one fourth of your plate with lean protein foods.  Eat 4-5 servings of fruit per day. A serving of fruit equals one medium whole fruit,  cup of dried fruit,  cup of fresh, frozen, or canned fruit. Try to avoid fruits in cups/syrups as the sugar content can be high.  Eat more foods that contain soluble fiber. Examples of foods that contain this type of fiber are apples, broccoli, carrots, beans, peas, and barley. Aim to get 20-30 g of fiber per day.  Eat more home-cooked food and less restaurant, buffet, and fast food.  Limit or avoid alcohol.  Limit foods that are high in starch and sugar.  Avoid fried foods when able.  Cook foods by using methods other than frying. Baking, boiling, grilling, and broiling are all great options. Other fat-reducing suggestions include: ? Removing the skin from poultry. ? Removing all visible fats from meats. ? Skimming the fat off of stews, soups, and gravies before serving them. ? Steaming vegetables in water or broth.  Lose weight if you are overweight. Losing just 5-10% of your initial body weight can help your overall health and prevent diseases such as diabetes and heart disease.  Increase your consumption of nuts, legumes, and seeds to 4-5 servings per week. One serving of dried beans or legumes equals  cup after being cooked, one serving of nuts equals 1 ounces, and one serving of seeds equals  ounce or 1 tablespoon.  WHAT ARE GOOD FOODS CAN I EAT? Grains Grainy breads (try to find bread that  is 3 g of fiber per slice or greater), oatmeal, light popcorn. Whole-grain cereals. Rice and pasta, including brown rice and those that are made with whole wheat. Edamame pasta is a great alternative to grain pasta. It has a higher protein content. Try to avoid significant consumption of white bread, sugary cereals, or pastries/baked goods.  Vegetables All vegetables. Cooked white potatoes do not count as vegetables.  Fruits All  fruits, but limit pineapple and bananas as these fruits have a higher sugar content.  Meats and Other Protein Sources Lean, well-trimmed beef, veal, pork, and lamb. Chicken and [place] without skin. All fish and shellfish. Wild duck, rabbit, pheasant, and venison. Egg whites or low-cholesterol egg substitutes. Dried beans, peas, lentils, and tofu.Seeds and most nuts.  Dairy Low-fat or nonfat cheeses, including ricotta, string, and mozzarella. Skim or 1% milk that is liquid, powdered, or evaporated. Buttermilk that is made with low-fat milk. Nonfat or low-fat yogurt. Soy/Almond milk are good alternatives if you cannot handle dairy.  Beverages Water is the best for you. Sports drinks with less sugar are more desirable unless you are a highly active athlete.  Sweets and Desserts Sherbets and fruit ices. Honey, jam, marmalade, jelly, and syrups. Dark chocolate.  Eat all sweets and desserts in moderation.  Fats and Oils Nonhydrogenated (trans-free) margarines. Vegetable oils, including soybean, sesame, sunflower, olive, peanut, safflower, corn, canola, and cottonseed. Salad dressings or mayonnaise that are made with a vegetable oil. Limit added fats and oils that you use for cooking, baking, salads, and as spreads.  Other Cocoa powder. Coffee and tea. Most condiments.  The items listed above may not be a complete list of recommended foods or beverages. Contact your dietitian for more options.

## 2019-01-09 NOTE — Progress Notes (Signed)
Chief Complaint  Patient presents with  . Follow-up    blood pressure    Subjective: Patient is a 68 y.o. male here for BP issues.  Hx of high BP, used to be on beta blocker. Diet is fair, could be better. Currently not on any meds. Has been walking, not as freq lately. +palpitations. Did have one episode of A fib when he was much younger. Lasts for a few moments, worse when his heart beats fasting. Does not pass out or feel as though he is going to pass out. No CP or SOB.   ROS: Heart: Denies chest pain  Lungs: Denies SOB   Past Medical History:  Diagnosis Date  . Chicken pox   . History of skin cancer   . Polyp of colon     Objective: BP (!) 156/88 (BP Location: Left Arm, Patient Position: Sitting, Cuff Size: Normal)   Pulse 88   Temp 97.8 F (36.6 C) (Oral)   Ht 6' (1.829 m)   Wt 246 lb (111.6 kg)   SpO2 98%   BMI 33.36 kg/m  General: Awake, appears stated age HEENT: MMM, EOMi Heart: reg rate, irreg rhythm. No bruits or LE edema Lungs: CTAB, no rales, wheezes or rhonchi. No accessory muscle use Psych: Age appropriate judgment and insight, normal affect and mood  Assessment and Plan: Elevated blood pressure reading - Plan: EKG 12-Lead, start metoprolol. Monitor BP at home.   Irregular heart beat - Plan: EKG 12-Lead, EKG shows PAC's. Start BB, reck in 2 weeks.   The patient voiced understanding and agreement to the plan.  [place], DO 01/09/19  4:11 PM

## 2019-01-28 ENCOUNTER — Ambulatory Visit (INDEPENDENT_AMBULATORY_CARE_PROVIDER_SITE_OTHER): Payer: PPO | Admitting: Family Medicine

## 2019-01-28 ENCOUNTER — Encounter: Payer: Self-pay | Admitting: Family Medicine

## 2019-01-28 ENCOUNTER — Other Ambulatory Visit: Payer: Self-pay

## 2019-01-28 VITALS — BP 128/78 | HR 77 | Temp 98.5°F | Ht 72.0 in | Wt 242.0 lb

## 2019-01-28 DIAGNOSIS — R61 Generalized hyperhidrosis: Secondary | ICD-10-CM | POA: Diagnosis not present

## 2019-01-28 DIAGNOSIS — I10 Essential (primary) hypertension: Secondary | ICD-10-CM

## 2019-01-28 NOTE — Progress Notes (Signed)
Chief Complaint  Patient presents with  . Hypertension    Subjective David [name] is a 68 y.o. male who presents for hypertension follow up. He does monitor home blood pressures. Blood pressures ranging from 160's/70-80's on average. He is compliant with medications. Patient has these side effects of medication: none He is adhering to a healthy diet overall. Current exercise: walking  For past couple weeks, has been waking up sweating at night. He sleeps with a thick comforter. No unintentional weight loss, fevers, cough, sob, meal assoc, hx of DM.    Past Medical History:  Diagnosis Date  . Chicken pox   . History of skin cancer   . Polyp of colon     Review of Systems Cardiovascular: no chest pain Respiratory:  no shortness of breath  Exam BP 128/78 (BP Location: Left Arm, Patient Position: Sitting, Cuff Size: Large)   Pulse 77   Temp 98.5 F (36.9 C) (Oral)   Ht 6' (1.829 m)   Wt 242 lb (109.8 kg)   SpO2 97%   BMI 32.82 kg/m  General:  well developed, well nourished, in no apparent distress Heart: RRR, no bruits, no LE edema Lungs: clear to auscultation, no accessory muscle use Psych: well oriented with normal range of affect and appropriate judgment/insight  Essential hypertension   Night sweats  Cont BB. Will have him ck BP at home, see in 2 weeks given high home readings.  Counseled on diet and exercise. Sounds like he is overheated from comforter. Change blankets and go from there.  F/u in 2 weeks, bring log and BP monitor. The patient voiced understanding and agreement to the plan.  [place], DO 01/28/19  8:47 AM

## 2019-01-28 NOTE — Patient Instructions (Addendum)
Keep the diet clean and stay active.  Bring your blood pressure monitor and your readings to your next appointment.   When you check your BP, make sure you have been doing something calm/relaxing 5 minutes prior to checking. Both feet should be flat on the floor and you should be sitting. Use your left arm and make sure it is in a relaxed position (on a table), and that the cuff is at the approximate level/height of your heart.  Try a lighter blanket and see how things go with the sweating.   Let us know if you need anything.

## 2019-02-11 ENCOUNTER — Other Ambulatory Visit: Payer: Self-pay

## 2019-02-11 ENCOUNTER — Encounter: Payer: Self-pay | Admitting: Family Medicine

## 2019-02-11 ENCOUNTER — Ambulatory Visit (INDEPENDENT_AMBULATORY_CARE_PROVIDER_SITE_OTHER): Payer: PPO | Admitting: Family Medicine

## 2019-02-11 VITALS — BP 138/70 | HR 59 | Temp 98.4°F | Ht 72.0 in | Wt 240.0 lb

## 2019-02-11 DIAGNOSIS — M79642 Pain in left hand: Secondary | ICD-10-CM

## 2019-02-11 DIAGNOSIS — I10 Essential (primary) hypertension: Secondary | ICD-10-CM

## 2019-02-11 MED ORDER — METOPROLOL SUCCINATE ER 100 MG PO TB24: 100.0000 mg | ORAL_TABLET | Freq: Every day | ORAL | 3 refills | Status: DC

## 2019-02-11 NOTE — Patient Instructions (Addendum)
Take two tabs of the metoprolol until you run out. I will call in a new dose for when you run out.  Send me a message or call in with your blood pressure readings in 2 weeks. Exact follow up depending on the results.   Keep the diet clean and stay active.  Consider a thumb spica splint. Wear at night and when you are using your left hand (riding).   Send message in 2 weeks if no better, will get you set up with occupational therapy.  Let us know if you need anything.

## 2019-02-11 NOTE — Progress Notes (Signed)
Chief Complaint  Patient presents with  . Follow-up    Subjective David [name] is a 68 y.o. male who presents for hypertension follow up. He does monitor home blood pressures. Blood pressures ranging from 130-150's/70-80's on average. He is compliant with medications- Toprol XL 50 mg/d. Patient has these side effects of medication: none He is adhering to a healthy diet overall. Current exercise: walking  1 mo of L hand pain at base of L thumb. No inj or change in activity. Not improving. Has not tried anything at home this far.    Past Medical History:  Diagnosis Date  . Chicken pox   . History of skin cancer   . Polyp of colon     Review of Systems Cardiovascular: no chest pain Respiratory:  no shortness of breath  Exam BP 138/70 (BP Location: Left Arm, Patient Position: Sitting, Cuff Size: Large)   Pulse (!) 59   Temp 98.4 F (36.9 C) (Oral)   Ht 6' (1.829 m)   Wt 240 lb (108.9 kg)   SpO2 98%   BMI 32.55 kg/m  General:  well developed, well nourished, in no apparent distress Heart: RRR, no bruits, no LE edema Lungs: clear to auscultation, no accessory muscle use MSK: +TTP over mast of 1st L MC, no edema or excessive warmth, mildly decreased ROM Neuro: sensation intact to light touch Psych: well oriented with normal range of affect and appropriate judgment/insight  Essential hypertension - Plan: increase dose of Toprol to 100 mg/d from 50 mg/d. Monitor BP's at home, send message in 2 weeks with readings. I think the increase will help get him in nml range.  Pain of left hand - Plan: Likely strain/sprain. Thumb spica, activity as tolerated. If no improvement, will set up with OT vs imaging.   Orders as above. Counseled on diet and exercise. F/u in 6 mo for CPE. The patient voiced understanding and agreement to the plan.  [place], DO 02/11/19  7:24 AM

## 2019-02-21 ENCOUNTER — Other Ambulatory Visit: Payer: Self-pay

## 2019-02-27 DIAGNOSIS — Z008 Encounter for other general examination: Secondary | ICD-10-CM | POA: Diagnosis not present

## 2019-02-27 DIAGNOSIS — Z9049 Acquired absence of other specified parts of digestive tract: Secondary | ICD-10-CM | POA: Diagnosis not present

## 2019-02-27 DIAGNOSIS — Z6833 Body mass index (BMI) 33.0-33.9, adult: Secondary | ICD-10-CM | POA: Diagnosis not present

## 2019-02-27 DIAGNOSIS — Z91030 Bee allergy status: Secondary | ICD-10-CM | POA: Diagnosis not present

## 2019-02-27 DIAGNOSIS — M779 Enthesopathy, unspecified: Secondary | ICD-10-CM | POA: Diagnosis not present

## 2019-02-27 DIAGNOSIS — Z86018 Personal history of other benign neoplasm: Secondary | ICD-10-CM | POA: Diagnosis not present

## 2019-02-27 DIAGNOSIS — I4891 Unspecified atrial fibrillation: Secondary | ICD-10-CM | POA: Diagnosis not present

## 2019-02-27 DIAGNOSIS — I10 Essential (primary) hypertension: Secondary | ICD-10-CM | POA: Diagnosis not present

## 2019-02-27 DIAGNOSIS — Z0000 Encounter for general adult medical examination without abnormal findings: Secondary | ICD-10-CM | POA: Diagnosis not present

## 2019-02-27 DIAGNOSIS — M542 Cervicalgia: Secondary | ICD-10-CM | POA: Diagnosis not present

## 2019-02-27 DIAGNOSIS — E669 Obesity, unspecified: Secondary | ICD-10-CM | POA: Diagnosis not present

## 2019-03-18 DIAGNOSIS — E669 Obesity, unspecified: Secondary | ICD-10-CM | POA: Diagnosis not present

## 2019-03-18 DIAGNOSIS — H9319 Tinnitus, unspecified ear: Secondary | ICD-10-CM | POA: Diagnosis not present

## 2019-03-18 DIAGNOSIS — R1310 Dysphagia, unspecified: Secondary | ICD-10-CM | POA: Diagnosis not present

## 2019-03-18 DIAGNOSIS — K089 Disorder of teeth and supporting structures, unspecified: Secondary | ICD-10-CM | POA: Diagnosis not present

## 2019-03-18 DIAGNOSIS — Z7189 Other specified counseling: Secondary | ICD-10-CM | POA: Diagnosis not present

## 2019-03-18 DIAGNOSIS — Z008 Encounter for other general examination: Secondary | ICD-10-CM | POA: Diagnosis not present

## 2019-03-18 DIAGNOSIS — I10 Essential (primary) hypertension: Secondary | ICD-10-CM | POA: Diagnosis not present

## 2019-03-18 DIAGNOSIS — H43399 Other vitreous opacities, unspecified eye: Secondary | ICD-10-CM | POA: Diagnosis not present

## 2019-03-18 DIAGNOSIS — Z23 Encounter for immunization: Secondary | ICD-10-CM | POA: Diagnosis not present

## 2019-03-18 DIAGNOSIS — Z0001 Encounter for general adult medical examination with abnormal findings: Secondary | ICD-10-CM | POA: Diagnosis not present

## 2019-03-18 DIAGNOSIS — M779 Enthesopathy, unspecified: Secondary | ICD-10-CM | POA: Diagnosis not present

## 2019-03-18 DIAGNOSIS — I4891 Unspecified atrial fibrillation: Secondary | ICD-10-CM | POA: Diagnosis not present

## 2019-06-10 ENCOUNTER — Other Ambulatory Visit: Payer: Self-pay | Admitting: Family Medicine

## 2022-03-01 HISTORY — PX: OTHER SURGICAL HISTORY: SHX169

## 2024-04-06 ENCOUNTER — Other Ambulatory Visit: Payer: Self-pay

## 2024-04-06 ENCOUNTER — Encounter (HOSPITAL_BASED_OUTPATIENT_CLINIC_OR_DEPARTMENT_OTHER): Payer: Self-pay | Admitting: Emergency Medicine

## 2024-04-06 ENCOUNTER — Emergency Department (HOSPITAL_BASED_OUTPATIENT_CLINIC_OR_DEPARTMENT_OTHER)

## 2024-04-06 ENCOUNTER — Emergency Department (HOSPITAL_BASED_OUTPATIENT_CLINIC_OR_DEPARTMENT_OTHER)
Admission: EM | Admit: 2024-04-06 | Discharge: 2024-04-06 | Disposition: A | Discharge status: Home / Self Care | Attending: Emergency Medicine | Admitting: Emergency Medicine

## 2024-04-06 DIAGNOSIS — M5412 Radiculopathy, cervical region: Secondary | ICD-10-CM | POA: Diagnosis not present

## 2024-04-06 DIAGNOSIS — M542 Cervicalgia: Secondary | ICD-10-CM | POA: Diagnosis present

## 2024-04-06 DIAGNOSIS — M25511 Pain in right shoulder: Secondary | ICD-10-CM | POA: Insufficient documentation

## 2024-04-06 DIAGNOSIS — M25512 Pain in left shoulder: Secondary | ICD-10-CM | POA: Insufficient documentation

## 2024-04-06 MED ORDER — OXYCODONE HCL 5 MG PO TABS: 5.0000 mg | ORAL_TABLET | Freq: Four times a day (QID) | ORAL | 0 refills | Status: DC | PRN

## 2024-04-06 MED ORDER — DEXAMETHASONE SODIUM PHOSPHATE 10 MG/ML IJ SOLN
10.0000 mg | Freq: Once | INTRAMUSCULAR | Status: AC
  Administered 2024-04-06: 10 mg via INTRAMUSCULAR
  Filled 2024-04-06: qty 1

## 2024-04-06 MED ORDER — METHYLPREDNISOLONE 4 MG PO TBPK
ORAL_TABLET | ORAL | 0 refills | Status: DC
Start: 2024-04-06 — End: 2024-05-29

## 2024-04-06 NOTE — ED Triage Notes (Addendum)
 Neck pain and BL shoulder pain x 3 weeks that has progressively worsened. Seen by pcp and was prescribed pregabalin, tramadol, and tizanidine. Some improvement in pain with medications. Was supposed to have xr's completed, but the md never put in the outpatient order. Pain reproducible with movement. New onset of BL medial hand numbness and decreased grip that started today. Denies injury.

## 2024-04-06 NOTE — ED Provider Notes (Signed)
 [facility] Provider Note   CSN: [id number] Arrival date & time: 04/06/24  1723     Patient presents with: Shoulder Pain and Neck Pain   David [name] is a 73 y.o. male.   Patient here with bilateral shoulder pain neck pain tingling down both arms decreased grip strength in the left hand.  He has been dealing with these issues for couple weeks.  But pain worse today.  Decreased grip strength in the left hand noticed today.  He has been on pregabalin tramadol without much improvement.  Denies any vision changes speech changes vision loss leg weakness.  He is supposed to see orthopedics soon about his shoulders.  He has not had any imaging done.  He has not seen a spine doctor either.  No significant medical history otherwise.  The history is provided by the patient.       Prior to Admission medications   Medication Sig Start Date End Date Taking? Authorizing Provider  methylPREDNISolone  (MEDROL  DOSEPAK) 4 MG TBPK tablet Follow package insert 04/06/24  Yes Aleesa Sweigert, DO  oxyCODONE  (ROXICODONE ) 5 MG immediate release tablet Take 1 tablet (5 mg total) by mouth every 6 (six) hours as needed for up to 10 doses. 04/06/24  Yes Lyn Joens, DO  ALFALFA PO Take 1 tablet by mouth daily.    [provider]  [facility] Liver Oil CAPS Take by mouth.    [provider]  Coenzyme Q10 100 MG capsule Take 100 mg by mouth.    [provider]  EPINEPHrine 0.3 mg/0.3 mL IJ SOAJ injection Inject into the skin. 03/09/14   [provider]  gabapentin  (NEURONTIN ) 300 MG capsule Take 1 capsule (300 mg total) by mouth 3 (three) times daily. 08/19/18   Loetta Senior, MD  Garlic 100 MG TABS Take 100 mg by mouth.    [provider]  metoprolol  succinate (TOPROL -XL) 100 MG 24 hr tablet TAKE 1 TABLET BY MOUTH ONCE DAILY TAKE  WITH  OR  IMNMEDIATELY  FOLLOWING  A  MEAL 06/10/19   Wendling, Mabel Mt, DO  Multiple Vitamin  (MULTI-VITAMINS) TABS Take by mouth.    [provider]    Allergies: Bee venom, Honey bee venom, and Other    Review of Systems  Updated Vital Signs BP (!) 143/64 (BP Location: Left Arm)   Pulse 77   Temp 98.3 F (36.8 C)   Resp 17   Ht 6' (1.829 m)   Wt 93.9 kg   SpO2 99%   BMI 28.07 kg/m   Physical Exam Vitals and nursing note reviewed.  Constitutional:      General: He is not in acute distress.    Appearance: He is well-developed. He is not ill-appearing.  HENT:     Head: Normocephalic and atraumatic.  Eyes:     Extraocular Movements: Extraocular movements intact.     Conjunctiva/sclera: Conjunctivae normal.     Pupils: Pupils are equal, round, and reactive to light.  Neck:     Comments: Tenderness to the paraspinal cervical muscles bilaterally Cardiovascular:     Rate and Rhythm: Normal rate and regular rhythm.     Heart sounds: No murmur heard. Pulmonary:     Effort: Pulmonary effort is normal. No respiratory distress.     Breath sounds: Normal breath sounds.  Abdominal:     Palpations: Abdomen is soft.     Tenderness: There is no abdominal tenderness.  Musculoskeletal:  General: Tenderness present. No swelling.     Cervical back: Normal range of motion and neck supple. Tenderness present.     Comments: Tenderness to both shoulders especially range of motion  Skin:    General: Skin is warm and dry.     Capillary Refill: Capillary refill takes less than 2 seconds.  Neurological:     Mental Status: He is alert.     Comments: He does have decreased grip strength in the left compared to the right but otherwise strength and sensation appear to be intact, normal visual fields normal speech normal coordination  Psychiatric:        Mood and Affect: Mood normal.     (all labs ordered are listed, but only abnormal results are displayed) Labs Reviewed - No data to display  EKG: None  Radiology: DG Shoulder Left Result Date: 04/06/2024 CLINICAL  DATA:  Left shoulder pain, no known injury, initial EXAM: LEFT SHOULDER - 2+ VIEW COMPARISON:  None Available. FINDINGS: Degenerative changes of the acromioclavicular joint are noted. No acute fracture or dislocation is seen. Underlying rib cage limits. IMPRESSION: Degenerative change without acute abnormality. Electronically Signed   By: Oneil Devonshire M.D.   On: 04/06/2024 19:30   DG Shoulder Right Result Date: 04/06/2024 CLINICAL DATA:  Right shoulder pain, no known injury, initial encounter EXAM: RIGHT SHOULDER - 2+ VIEW COMPARISON:  None Available. FINDINGS: No acute fracture or dislocation is noted. Humeral head is high-riding with remodeling of the inferior acromion likely related to chronic rotator cuff injury. Chronic rib fractures are noted. IMPRESSION: Degenerative change as described suspicious for rotator cuff injury Electronically Signed   By: Oneil Devonshire M.D.   On: 04/06/2024 19:28   DG Cervical Spine Complete Result Date: 04/06/2024 CLINICAL DATA:  Neck pain with bilateral shoulder radiculopathy EXAM: CERVICAL SPINE - COMPLETE 4+ VIEW COMPARISON:  None Available. FINDINGS: Frontal, bilateral oblique, lateral views of the cervical spine are obtained on 5 images. Alignment is anatomic the cervicothoracic junction. There are no acute displaced fractures. Bridging anterior osteophytes are seen from C4 through C7, with marked disc space narrowing at the C6-7 level. Mild diffuse facet hypertrophy greatest at C3-4, C4-5, and C5-6. Left predominant neural foraminal encroachment at C3-4, with symmetrical bilateral neural foraminal narrowing at C4-5 and C5-6. Left predominant neural foraminal encroachment is seen at C6-7. Prevertebral soft tissues are unremarkable. Lung apices are clear. IMPRESSION: 1. Multilevel cervical spondylosis and facet hypertrophy as above. 2. No acute bony abnormality. Electronically Signed   By: Ozell Daring M.D.   On: 04/06/2024 18:20     Procedures   Medications  Ordered in the ED  dexamethasone  (DECADRON ) injection 10 mg (10 mg Intramuscular Given 04/06/24 1858)                                    Medical Decision Making Amount and/or Complexity of Data Reviewed Radiology: ordered.  Risk Prescription drug management.   Shepherd Finnan is here with neck pain shoulder pain decreased grip strength.  Unremarkable vitals.  No fever.  Neurologically is intact but I do appreciate decreased grip strength in the left hand.  He has been dealing with these issues for a couple weeks but pain got worse today grip strength decreased today.  Overall I have very low suspicion for stroke and I do think that this is likely a cervical radiculopathy or some sort of shoulder arthropathy.  CT scan of the neck was obtained that showed facet disease and arthritic changes.  X-rays also of the shoulder showing the same may be a rotator cuff injury as well.  Ultimately I offered patient MRI of the brain and MRI of the cervical spine to further rule out stroke or cord compression and to get a better sense of what was going on the cervical spine.  He does have decreased strength in the left hand.  But he appears to have good strength and sensation otherwise.  He has been on some medications pregabalin and tramadol for this.  This has been ongoing for couple weeks but seem to get worse today.  He does not really have any stroke symptoms otherwise.  I do think that this is a cervical process.  Patient actually felt better after the Decadron  shot.  He understands the risks and benefits of going for MRI and he declines to do this at this time.  I will have him follow-up with spine I told him to return if he changes his mind about the images.  He has follow-up with orthopedics in place about his shoulders as well.  Patient discharged.  Understands return precautions.  This chart was dictated using voice recognition software.  Despite best efforts to proofread,  errors can occur which can change  the documentation meaning.      Final diagnoses:  Cervical radiculopathy  Acute pain of right shoulder  Acute pain of left shoulder    ED Discharge Orders          Ordered    methylPREDNISolone  (MEDROL  DOSEPAK) 4 MG TBPK tablet        04/06/24 1937    oxyCODONE  (ROXICODONE ) 5 MG immediate release tablet  Every 6 hours PRN        04/06/24 1937               Ruthe Cornet, DO 04/06/24 1942

## 2024-04-06 NOTE — Discharge Instructions (Signed)
 Discontinue your tramadol.  Take Roxicodone  instead.  This is a narcotic pain medicine so please be careful with its use.  Take Medrol  Dosepak as prescribed.  If you change your mind about pursuing MRI to further rule out emergent processes please return to [facility] for MRI of your neck and brain.  Follow-up with orthopedic doctor as scheduled.  Follow-up with spine team as well.  I provided you follow-up information for that.

## 2024-04-18 ENCOUNTER — Encounter (HOSPITAL_COMMUNITY): Payer: Self-pay

## 2024-04-18 ENCOUNTER — Other Ambulatory Visit: Payer: Self-pay | Admitting: Neurosurgery

## 2024-04-18 ENCOUNTER — Encounter (HOSPITAL_COMMUNITY): Payer: Self-pay | Admitting: Neurosurgery

## 2024-04-18 ENCOUNTER — Inpatient Hospital Stay (HOSPITAL_COMMUNITY)

## 2024-04-18 ENCOUNTER — Inpatient Hospital Stay (HOSPITAL_COMMUNITY)
Admission: AD | Admit: 2024-04-18 | Discharge: 2024-04-26 | DRG: 518 | Disposition: A | Discharge status: Home Health | Attending: Neurosurgery | Admitting: Neurosurgery

## 2024-04-18 ENCOUNTER — Encounter: Payer: Self-pay | Admitting: Neurosurgery

## 2024-04-18 DIAGNOSIS — M4802 Spinal stenosis, cervical region: Principal | ICD-10-CM | POA: Diagnosis present

## 2024-04-18 DIAGNOSIS — Z833 Family history of diabetes mellitus: Secondary | ICD-10-CM | POA: Diagnosis not present

## 2024-04-18 DIAGNOSIS — Z8549 Personal history of malignant neoplasm of other male genital organs: Secondary | ICD-10-CM | POA: Diagnosis not present

## 2024-04-18 DIAGNOSIS — Z7901 Long term (current) use of anticoagulants: Secondary | ICD-10-CM

## 2024-04-18 DIAGNOSIS — K5900 Constipation, unspecified: Secondary | ICD-10-CM | POA: Diagnosis not present

## 2024-04-18 DIAGNOSIS — M4642 Discitis, unspecified, cervical region: Secondary | ICD-10-CM | POA: Diagnosis present

## 2024-04-18 DIAGNOSIS — Z8261 Family history of arthritis: Secondary | ICD-10-CM | POA: Diagnosis not present

## 2024-04-18 DIAGNOSIS — Z823 Family history of stroke: Secondary | ICD-10-CM | POA: Diagnosis not present

## 2024-04-18 DIAGNOSIS — Z85828 Personal history of other malignant neoplasm of skin: Secondary | ICD-10-CM

## 2024-04-18 DIAGNOSIS — M4622 Osteomyelitis of vertebra, cervical region: Secondary | ICD-10-CM | POA: Diagnosis present

## 2024-04-18 DIAGNOSIS — G992 Myelopathy in diseases classified elsewhere: Principal | ICD-10-CM | POA: Diagnosis present

## 2024-04-18 DIAGNOSIS — M4624 Osteomyelitis of vertebra, thoracic region: Secondary | ICD-10-CM | POA: Diagnosis present

## 2024-04-18 DIAGNOSIS — Z91030 Bee allergy status: Secondary | ICD-10-CM

## 2024-04-18 DIAGNOSIS — G061 Intraspinal abscess and granuloma: Secondary | ICD-10-CM | POA: Diagnosis present

## 2024-04-18 DIAGNOSIS — Z8249 Family history of ischemic heart disease and other diseases of the circulatory system: Secondary | ICD-10-CM | POA: Diagnosis not present

## 2024-04-18 DIAGNOSIS — M4623 Osteomyelitis of vertebra, cervicothoracic region: Secondary | ICD-10-CM | POA: Diagnosis not present

## 2024-04-18 DIAGNOSIS — I4891 Unspecified atrial fibrillation: Secondary | ICD-10-CM | POA: Diagnosis not present

## 2024-04-18 DIAGNOSIS — I10 Essential (primary) hypertension: Secondary | ICD-10-CM | POA: Diagnosis present

## 2024-04-18 DIAGNOSIS — Z79899 Other long term (current) drug therapy: Secondary | ICD-10-CM

## 2024-04-18 LAB — CBC
HCT: 35.5 % — ABNORMAL LOW (ref 39.0–52.0)
Hemoglobin: 11.3 g/dL — ABNORMAL LOW (ref 13.0–17.0)
MCH: 28.5 pg (ref 26.0–34.0)
MCHC: 31.8 g/dL (ref 30.0–36.0)
MCV: 89.4 fL (ref 80.0–100.0)
Platelets: 258 K/uL (ref 150–400)
RBC: 3.97 MIL/uL — ABNORMAL LOW (ref 4.22–5.81)
RDW: 15.9 % — ABNORMAL HIGH (ref 11.5–15.5)
WBC: 8.5 K/uL (ref 4.0–10.5)
nRBC: 0 % (ref 0.0–0.2)

## 2024-04-18 LAB — C-REACTIVE PROTEIN: CRP: 2.7 mg/dL — ABNORMAL HIGH (ref <1.0)

## 2024-04-18 LAB — BASIC METABOLIC PANEL WITH GFR
Anion gap: 10 (ref 5–15)
BUN: 18 mg/dL (ref 8–23)
CO2: 28 mmol/L (ref 22–32)
Calcium: 9.5 mg/dL (ref 8.9–10.3)
Chloride: 100 mmol/L (ref 98–111)
Creatinine, Ser: 0.8 mg/dL (ref 0.61–1.24)
GFR, Estimated: >60 mL/min (ref >60)
Glucose, Bld: 96 mg/dL (ref 70–99)
Potassium: 4.3 mmol/L (ref 3.5–5.1)
Sodium: 138 mmol/L (ref 135–145)

## 2024-04-18 LAB — SEDIMENTATION RATE: Sed Rate: 65 mm/h — ABNORMAL HIGH (ref 0–16)

## 2024-04-18 MED ORDER — SODIUM CHLORIDE 0.9% FLUSH
3.0000 mL | Freq: Two times a day (BID) | INTRAVENOUS | Status: DC
  Administered 2024-04-19 – 2024-04-23 (×9): 3 mL via INTRAVENOUS

## 2024-04-18 MED ORDER — METHOCARBAMOL 1000 MG/10ML IJ SOLN
500.0000 mg | Freq: Four times a day (QID) | INTRAMUSCULAR | Status: DC | PRN
  Administered 2024-04-21 – 2024-04-26 (×7): 500 mg via INTRAVENOUS
  Filled 2024-04-18 (×7): qty 10

## 2024-04-18 MED ORDER — HYDROMORPHONE HCL 1 MG/ML IJ SOLN
0.5000 mg | INTRAMUSCULAR | Status: DC | PRN
  Administered 2024-04-19 – 2024-04-23 (×3): 1 mg via INTRAVENOUS
  Filled 2024-04-18 (×4): qty 1

## 2024-04-18 MED ORDER — ACETAMINOPHEN 325 MG PO TABS: 650.0000 mg | ORAL_TABLET | Freq: Four times a day (QID) | ORAL | Status: DC | PRN

## 2024-04-18 MED ORDER — SODIUM CHLORIDE 0.9 % IV SOLN: 250.0000 mL | INTRAVENOUS | Status: AC | PRN

## 2024-04-18 MED ORDER — ONDANSETRON HCL 4 MG PO TABS: 4.0000 mg | ORAL_TABLET | Freq: Four times a day (QID) | ORAL | Status: DC | PRN

## 2024-04-18 MED ORDER — SODIUM CHLORIDE 0.9% FLUSH
3.0000 mL | Freq: Two times a day (BID) | INTRAVENOUS | Status: DC
  Administered 2024-04-19 – 2024-04-23 (×7): 3 mL via INTRAVENOUS

## 2024-04-18 MED ORDER — ACETAMINOPHEN 650 MG RE SUPP: 650.0000 mg | Freq: Four times a day (QID) | RECTAL | Status: DC | PRN

## 2024-04-18 MED ORDER — HYDROCODONE-ACETAMINOPHEN 5-325 MG PO TABS
1.0000 | ORAL_TABLET | ORAL | Status: DC | PRN
  Administered 2024-04-18: 2 via ORAL
  Administered 2024-04-19: 1 via ORAL
  Administered 2024-04-20 – 2024-04-25 (×20): 2 via ORAL
  Administered 2024-04-26: 1 via ORAL
  Administered 2024-04-26: 2 via ORAL
  Administered 2024-04-26: 1 via ORAL
  Filled 2024-04-18: qty 2
  Filled 2024-04-18: qty 1
  Filled 2024-04-18 (×15): qty 2
  Filled 2024-04-18: qty 1
  Filled 2024-04-18 (×2): qty 2
  Filled 2024-04-18: qty 1
  Filled 2024-04-18 (×5): qty 2

## 2024-04-18 MED ORDER — ONDANSETRON HCL 4 MG/2ML IJ SOLN: 4.0000 mg | Freq: Four times a day (QID) | INTRAMUSCULAR | Status: DC | PRN

## 2024-04-18 MED ORDER — METOPROLOL SUCCINATE ER 50 MG PO TB24
100.0000 mg | ORAL_TABLET | Freq: Every day | ORAL | Status: DC
  Administered 2024-04-18 – 2024-04-25 (×6): 100 mg via ORAL
  Filled 2024-04-18 (×7): qty 2

## 2024-04-18 MED ORDER — COENZYME Q10 100 MG PO CAPS: 100.0000 mg | ORAL_CAPSULE | Freq: Every day | ORAL | Status: DC

## 2024-04-18 MED ORDER — GADOBUTROL 1 MMOL/ML IV SOLN
9.0000 mL | Freq: Once | INTRAVENOUS | Status: AC | PRN
Start: 2024-04-18 — End: 2024-04-18
  Administered 2024-04-18: 9 mL via INTRAVENOUS

## 2024-04-18 MED ORDER — VANCOMYCIN HCL 2000 MG/400ML IV SOLN
2000.0000 mg | Freq: Once | INTRAVENOUS | Status: AC
  Administered 2024-04-18: 2000 mg via INTRAVENOUS
  Filled 2024-04-18: qty 400

## 2024-04-18 MED ORDER — SODIUM CHLORIDE 0.9% FLUSH: 3.0000 mL | INTRAVENOUS | Status: DC | PRN

## 2024-04-18 MED ORDER — GABAPENTIN 300 MG PO CAPS
300.0000 mg | ORAL_CAPSULE | Freq: Three times a day (TID) | ORAL | Status: DC
Start: 2024-04-18 — End: 2024-04-26
  Administered 2024-04-18 – 2024-04-26 (×23): 300 mg via ORAL
  Filled 2024-04-18 (×23): qty 1

## 2024-04-18 MED ORDER — VANCOMYCIN HCL 1250 MG/250ML IV SOLN
1250.0000 mg | Freq: Two times a day (BID) | INTRAVENOUS | Status: DC
Start: 2024-04-19 — End: 2024-04-22
  Administered 2024-04-19 – 2024-04-21 (×6): 1250 mg via INTRAVENOUS
  Filled 2024-04-18 (×7): qty 250

## 2024-04-18 MED ORDER — SODIUM CHLORIDE 0.9 % IV SOLN
2.0000 g | INTRAVENOUS | Status: DC
  Administered 2024-04-18 – 2024-04-26 (×8): 2 g via INTRAVENOUS
  Filled 2024-04-18 (×8): qty 20

## 2024-04-18 NOTE — Plan of Care (Signed)
   Problem: Education: Goal: Knowledge of General Education information will improve Description: Including pain rating scale, medication(s)/side effects and non-pharmacologic comfort measures Outcome: Progressing   Problem: Elimination: Goal: Will not experience complications related to bowel motility Outcome: Progressing

## 2024-04-18 NOTE — Progress Notes (Signed)
 Pharmacy Antibiotic Note  David [name] is a 73 y.o. male admitted on 04/18/2024 with r/o spinal osteomyelitis - MRI shows fluid signal in the epidural space dorsally from C5-6 through C6-7 worrisome for either cervical epidural abscess or cervical epidural hematoma.  Pharmacy has been consulted for Vancomycin  dosing. Pt also on Rocephin .  Plan: Vancomycin  2000mg  IV now then 1250 mg IV Q 12 hrs. Goal AUC 400-550. Expected AUC: 472 SCr used: 0.8 Will f/u renal function, micro data, and pt's clinical condition Vanc levels prn      No data recorded.  No results for input(s): WBC, CREATININE, LATICACIDVEN, VANCOTROUGH, VANCOPEAK, VANCORANDOM, GENTTROUGH, GENTPEAK, GENTRANDOM, TOBRATROUGH, TOBRAPEAK, TOBRARND, AMIKACINPEAK, AMIKACINTROU, AMIKACIN in the last 168 hours.  CrCl cannot be calculated (Patient's most recent lab result is older than the maximum 21 days allowed.).    Allergies  Allergen Reactions   Bee Venom Swelling   Honey Bee Venom Swelling   Other Swelling    Antimicrobials this admission: 9/25 Vanc >>  9/25 Rocephin  >>  Microbiology results: Pending  Thank you for allowing pharmacy to be a part of this patient's care.  Vito Ralph, PharmD, BCPS Please see amion for complete clinical pharmacist phone list 04/18/2024 4:27 PM

## 2024-04-18 NOTE — H&P (Signed)
 David [name] is an 73 y.o. male.   Chief Complaint: Weakness HPI:  72 year old male presented to my office today with a 3 week history of sudden onset neck pain radiating into his shoulders left greater than right.  Patient is noted progressive radiating pain numbness and weakness into both upper extremities.  The patient denies lower extremity pain numbness or weakness.  He is having no bowel or bladder dysfunction.  On examination today in the office the patient was found to have profound weakness worrisome for cervical myelopathy.  An urgent MRI scan was obtained.  This demonstrates evidence of spondylitic disease predominantly at C5-6 and C6-7 with some early ventral stenosis.  There is fluid signal in the epidural space dorsally from C5-6 through C6-7 worrisome for either cervical epidural abscess or cervical epidural hematoma.  The patient denies history of recent fever.  His pain is not dramatically worsened by movement.  The patient is being admitted for further evaluation and possible decompressive surgery tomorrow.  Past Medical History:  Diagnosis Date   Chicken pox    History of skin cancer    Polyp of colon     Past Surgical History:  Procedure Laterality Date   APPENDECTOMY      Family History  Problem Relation Age of Onset   Arthritis Mother    Diabetes Mother    Heart attack Father    Heart attack Brother    Stroke Brother    Hypertension Brother    Hypertension Brother    Social History:  reports that he has never smoked. He has never used smokeless tobacco. He reports current alcohol use. He reports that he does not use drugs.  Allergies:  Allergies  Allergen Reactions   Bee Venom Swelling   Honey Bee Venom Swelling   Other Swelling    (Not in a hospital admission)   No results found for this or any previous visit (from the past 48 hours). No results found.  Pertinent items noted in HPI and remainder of comprehensive ROS otherwise negative.  There were no  vitals taken for this visit.    Patient is awake and alert.  He is oriented and appropriate.  He does not appear toxic.   Speech is fluent.  Judgment insight are intact.  Cranial nerve function normal bilaterally.  Motor examination of his upper extremities revealed normal strength in his deltoid muscle group with some mild left-sided biceps weakness.  He has significant weakness of wrist extension on the left grading out at 4- over 5.  He has mild weakness of his right wrist extensors grading at 4+ over 5.  He has profound weakness of his triceps muscle group with 2/5 left triceps strength and 3/5 right triceps strength.  He has serious grip weakness grading at 3/5 bilaterally.  He has intrinsic weakness grading at L4-5 on the right and 4- over 5 on the left.  Lower extremity strength and tone are normal.  Reflexes are normal.  Examination head ears eyes known to [place].  Chest and abdomen are benign.  Cervical spine is not particularly tender.  There is no evidence of soft tissue swelling.  Examination of his extremities revealed no evidence of accident or injury. 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  Plan to admit to the hospital for further evaluation with contrasted MRI scan of his cervical spine as well as laboratory studies to evaluate for possible infection.  Tentatively planning for decompressive  surgery tomorrow.  [facility] Eliette Drumwright 04/18/2024, 3:22 PM

## 2024-04-18 NOTE — Progress Notes (Signed)
 PHARMACIST - PHYSICIAN ORDER COMMUNICATION  CONCERNING: P&T Medication Policy on Herbal Medications  DESCRIPTION:  This patient's order for:  Coenzyme Q10  has been noted.  This product(s) is classified as an "herbal" or natural product. Due to a lack of definitive safety studies or FDA approval, nonstandard manufacturing practices, plus the potential risk of unknown drug-drug interactions while on inpatient medications, the Pharmacy and Therapeutics Committee does not permit the use of "herbal" or natural products of this type within [facility].   ACTION TAKEN: The pharmacy department is unable to verify this order at this time and your patient has been informed of this safety policy. Please reevaluate patient's clinical condition at discharge and address if the herbal or natural product(s) should be resumed at that time.  Vito Ralph, PharmD, BCPS Please see amion for complete clinical pharmacist phone list 04/18/2024 4:24 PM

## 2024-04-19 ENCOUNTER — Inpatient Hospital Stay (HOSPITAL_COMMUNITY): Admitting: Anesthesiology

## 2024-04-19 ENCOUNTER — Ambulatory Visit (HOSPITAL_COMMUNITY): Admission: RE | Admit: 2024-04-19 | Source: Home / Self Care | Admitting: Neurosurgery

## 2024-04-19 ENCOUNTER — Encounter (HOSPITAL_COMMUNITY): Payer: Self-pay | Admitting: Neurosurgery

## 2024-04-19 ENCOUNTER — Inpatient Hospital Stay (HOSPITAL_COMMUNITY)

## 2024-04-19 ENCOUNTER — Other Ambulatory Visit: Payer: Self-pay

## 2024-04-19 ENCOUNTER — Encounter (HOSPITAL_COMMUNITY)
Admission: AD | Disposition: A | Discharge status: Home Health | Payer: Self-pay | Source: Home / Self Care | Attending: Neurosurgery

## 2024-04-19 DIAGNOSIS — I10 Essential (primary) hypertension: Secondary | ICD-10-CM | POA: Diagnosis not present

## 2024-04-19 DIAGNOSIS — G061 Intraspinal abscess and granuloma: Secondary | ICD-10-CM

## 2024-04-19 HISTORY — DX: Malignant (primary) neoplasm, unspecified: C80.1

## 2024-04-19 HISTORY — PX: FORAMINOTOMY, SPINE, CERVICAL, 1 LEVEL: SHX6970

## 2024-04-19 LAB — GLUCOSE, CAPILLARY: Glucose-Capillary: 190 mg/dL — ABNORMAL HIGH (ref 70–99)

## 2024-04-19 LAB — SURGICAL PCR SCREEN
MRSA, PCR: NEGATIVE
Staphylococcus aureus: NEGATIVE

## 2024-04-19 LAB — HEMOGLOBIN A1C
Hgb A1c MFr Bld: 4.9 % (ref 4.8–5.6)
Mean Plasma Glucose: 93.93 mg/dL

## 2024-04-19 SURGERY — FORAMINOTOMY, SPINE, CERVICAL, 1 LEVEL
Anesthesia: General

## 2024-04-19 MED ORDER — ORAL CARE MOUTH RINSE: 15.0000 mL | Freq: Once | OROMUCOSAL | Status: AC

## 2024-04-19 MED ORDER — DROPERIDOL 2.5 MG/ML IJ SOLN: 0.6250 mg | Freq: Once | INTRAMUSCULAR | Status: DC | PRN

## 2024-04-19 MED ORDER — HYDROMORPHONE HCL 1 MG/ML IJ SOLN
INTRAMUSCULAR | Status: AC
  Filled 2024-04-19: qty 1

## 2024-04-19 MED ORDER — CHLORHEXIDINE GLUCONATE CLOTH 2 % EX PADS
6.0000 | MEDICATED_PAD | Freq: Once | CUTANEOUS | Status: AC
  Administered 2024-04-19: 6 via TOPICAL

## 2024-04-19 MED ORDER — ROCURONIUM BROMIDE 10 MG/ML (PF) SYRINGE
PREFILLED_SYRINGE | INTRAVENOUS | Status: DC | PRN
  Administered 2024-04-19: 10 mg via INTRAVENOUS
  Administered 2024-04-19: 70 mg via INTRAVENOUS
  Administered 2024-04-19: 10 mg via INTRAVENOUS

## 2024-04-19 MED ORDER — THROMBIN 20000 UNITS EX SOLR
CUTANEOUS | Status: AC
  Filled 2024-04-19: qty 20000

## 2024-04-19 MED ORDER — VANCOMYCIN HCL 1000 MG IV SOLR
INTRAVENOUS | Status: AC
Start: 2024-04-19 — End: 2024-04-19
  Filled 2024-04-19: qty 20

## 2024-04-19 MED ORDER — MEPERIDINE HCL 25 MG/ML IJ SOLN: 6.2500 mg | INTRAMUSCULAR | Status: DC | PRN

## 2024-04-19 MED ORDER — CHLORHEXIDINE GLUCONATE CLOTH 2 % EX PADS: 6.0000 | MEDICATED_PAD | Freq: Once | CUTANEOUS | Status: AC

## 2024-04-19 MED ORDER — PROPOFOL 1000 MG/100ML IV EMUL
INTRAVENOUS | Status: AC
  Filled 2024-04-19: qty 100

## 2024-04-19 MED ORDER — ROCURONIUM BROMIDE 10 MG/ML (PF) SYRINGE
PREFILLED_SYRINGE | INTRAVENOUS | Status: AC
  Filled 2024-04-19: qty 10

## 2024-04-19 MED ORDER — DEXAMETHASONE SODIUM PHOSPHATE 10 MG/ML IJ SOLN
INTRAMUSCULAR | Status: DC | PRN
  Administered 2024-04-19: 10 mg via INTRAVENOUS

## 2024-04-19 MED ORDER — MIDAZOLAM HCL 2 MG/2ML IJ SOLN
INTRAMUSCULAR | Status: AC
  Filled 2024-04-19: qty 2

## 2024-04-19 MED ORDER — PROPOFOL 10 MG/ML IV BOLUS
INTRAVENOUS | Status: DC | PRN
  Administered 2024-04-19: 150 mg via INTRAVENOUS

## 2024-04-19 MED ORDER — ACETAMINOPHEN 10 MG/ML IV SOLN
INTRAVENOUS | Status: AC
  Filled 2024-04-19: qty 100

## 2024-04-19 MED ORDER — ONDANSETRON HCL 4 MG/2ML IJ SOLN
INTRAMUSCULAR | Status: AC
  Filled 2024-04-19: qty 2

## 2024-04-19 MED ORDER — VANCOMYCIN HCL 1000 MG IV SOLR
INTRAVENOUS | Status: DC | PRN
  Administered 2024-04-19: 1000 mg via TOPICAL

## 2024-04-19 MED ORDER — ACETAMINOPHEN 10 MG/ML IV SOLN
1000.0000 mg | Freq: Once | INTRAVENOUS | Status: DC | PRN
  Administered 2024-04-19: 1000 mg via INTRAVENOUS

## 2024-04-19 MED ORDER — BUPIVACAINE HCL (PF) 0.25 % IJ SOLN
INTRAMUSCULAR | Status: AC
  Filled 2024-04-19: qty 30

## 2024-04-19 MED ORDER — INSULIN ASPART 100 UNIT/ML IJ SOLN
0.0000 [IU] | Freq: Three times a day (TID) | INTRAMUSCULAR | Status: DC
  Administered 2024-04-20 (×3): 2 [IU] via SUBCUTANEOUS
  Administered 2024-04-21: 5 [IU] via SUBCUTANEOUS
  Administered 2024-04-22 – 2024-04-25 (×5): 2 [IU] via SUBCUTANEOUS
  Administered 2024-04-25: 3 [IU] via SUBCUTANEOUS

## 2024-04-19 MED ORDER — 0.9 % SODIUM CHLORIDE (POUR BTL) OPTIME
TOPICAL | Status: DC | PRN
  Administered 2024-04-19: 1000 mL

## 2024-04-19 MED ORDER — BACITRACIN ZINC 500 UNIT/GM EX OINT
TOPICAL_OINTMENT | CUTANEOUS | Status: DC | PRN
  Administered 2024-04-19: 1 via TOPICAL

## 2024-04-19 MED ORDER — LIDOCAINE 2% (20 MG/ML) 5 ML SYRINGE
INTRAMUSCULAR | Status: AC
  Filled 2024-04-19: qty 5

## 2024-04-19 MED ORDER — CHLORHEXIDINE GLUCONATE 0.12 % MT SOLN: 15.0000 mL | Freq: Once | OROMUCOSAL | Status: AC

## 2024-04-19 MED ORDER — PHENYLEPHRINE 80 MCG/ML (10ML) SYRINGE FOR IV PUSH (FOR BLOOD PRESSURE SUPPORT)
PREFILLED_SYRINGE | INTRAVENOUS | Status: DC | PRN
  Administered 2024-04-19 (×2): 80 ug via INTRAVENOUS

## 2024-04-19 MED ORDER — OXYCODONE HCL 5 MG/5ML PO SOLN: 5.0000 mg | Freq: Once | ORAL | Status: DC | PRN

## 2024-04-19 MED ORDER — PHENYLEPHRINE HCL-NACL 20-0.9 MG/250ML-% IV SOLN
INTRAVENOUS | Status: DC | PRN
  Administered 2024-04-19: 50 ug/min via INTRAVENOUS

## 2024-04-19 MED ORDER — FENTANYL CITRATE (PF) 250 MCG/5ML IJ SOLN
INTRAMUSCULAR | Status: DC | PRN
  Administered 2024-04-19 (×4): 50 ug via INTRAVENOUS

## 2024-04-19 MED ORDER — OXYCODONE HCL 5 MG PO TABS: 5.0000 mg | ORAL_TABLET | Freq: Once | ORAL | Status: DC | PRN

## 2024-04-19 MED ORDER — LACTATED RINGERS IV SOLN: INTRAVENOUS | Status: DC

## 2024-04-19 MED ORDER — HYDROMORPHONE HCL 1 MG/ML IJ SOLN
0.2500 mg | INTRAMUSCULAR | Status: DC | PRN
  Administered 2024-04-19 (×3): 0.5 mg via INTRAVENOUS

## 2024-04-19 MED ORDER — BACITRACIN ZINC 500 UNIT/GM EX OINT
TOPICAL_OINTMENT | CUTANEOUS | Status: AC
  Filled 2024-04-19: qty 28.35

## 2024-04-19 MED ORDER — FENTANYL CITRATE (PF) 250 MCG/5ML IJ SOLN
INTRAMUSCULAR | Status: AC
  Filled 2024-04-19: qty 5

## 2024-04-19 MED ORDER — LIDOCAINE 2% (20 MG/ML) 5 ML SYRINGE
INTRAMUSCULAR | Status: DC | PRN
  Administered 2024-04-19: 100 mg via INTRAVENOUS

## 2024-04-19 MED ORDER — CHLORHEXIDINE GLUCONATE 0.12 % MT SOLN
OROMUCOSAL | Status: AC
  Administered 2024-04-19: 15 mL via OROMUCOSAL
  Filled 2024-04-19: qty 15

## 2024-04-19 MED ORDER — CEFAZOLIN SODIUM-DEXTROSE 2-4 GM/100ML-% IV SOLN
2.0000 g | INTRAVENOUS | Status: AC
  Administered 2024-04-19: 2 g via INTRAVENOUS
  Filled 2024-04-19: qty 100

## 2024-04-19 MED ORDER — ONDANSETRON HCL 4 MG/2ML IJ SOLN
INTRAMUSCULAR | Status: DC | PRN
  Administered 2024-04-19: 4 mg via INTRAVENOUS

## 2024-04-19 MED ORDER — DEXAMETHASONE SODIUM PHOSPHATE 10 MG/ML IJ SOLN
INTRAMUSCULAR | Status: AC
Start: 2024-04-19 — End: 2024-04-19
  Filled 2024-04-19: qty 1

## 2024-04-19 MED ORDER — BUPIVACAINE HCL (PF) 0.25 % IJ SOLN
INTRAMUSCULAR | Status: DC | PRN
  Administered 2024-04-19: 20 mL

## 2024-04-19 MED ORDER — THROMBIN 20000 UNITS EX SOLR
CUTANEOUS | Status: DC | PRN
  Administered 2024-04-19: 20 mL via TOPICAL

## 2024-04-19 MED ORDER — MIDAZOLAM HCL 2 MG/2ML IJ SOLN
INTRAMUSCULAR | Status: DC | PRN
  Administered 2024-04-19: 2 mg via INTRAVENOUS

## 2024-04-19 MED ORDER — MUPIROCIN 2 % EX OINT
1.0000 | TOPICAL_OINTMENT | Freq: Two times a day (BID) | CUTANEOUS | Status: AC
Start: 2024-04-19 — End: 2024-04-23
  Administered 2024-04-19 – 2024-04-23 (×10): 1 via NASAL
  Filled 2024-04-19: qty 22

## 2024-04-19 MED ORDER — SODIUM CHLORIDE 0.9 % IV SOLN: INTRAVENOUS | Status: DC | PRN

## 2024-04-19 MED ORDER — LACTATED RINGERS IV SOLN
INTRAVENOUS | Status: DC
Start: 2024-04-19 — End: 2024-04-19

## 2024-04-19 MED ORDER — PROPOFOL 10 MG/ML IV BOLUS
INTRAVENOUS | Status: AC
  Filled 2024-04-19: qty 20

## 2024-04-19 MED ORDER — SUGAMMADEX SODIUM 200 MG/2ML IV SOLN
INTRAVENOUS | Status: DC | PRN
  Administered 2024-04-19: 200 mg via INTRAVENOUS

## 2024-04-19 SURGICAL SUPPLY — 42 items
BAG COUNTER SPONGE SURGICOUNT (BAG) ×1 IMPLANT
BAND RUBBER #18 3X1/16 STRL (MISCELLANEOUS) ×2 IMPLANT
BENZOIN TINCTURE PRP APPL 2/3 (GAUZE/BANDAGES/DRESSINGS) ×1 IMPLANT
BLADE CLIPPER SURG (BLADE) IMPLANT
BUR MATCHSTICK NEURO 3.0 LAGG (BURR) ×1 IMPLANT
CANISTER SUCTION 3000ML PPV (SUCTIONS) ×1 IMPLANT
DERMABOND ADVANCED .7 DNX12 (GAUZE/BANDAGES/DRESSINGS) ×1 IMPLANT
DRAPE LAPAROTOMY 100X72 PEDS (DRAPES) ×1 IMPLANT
DRAPE MICROSCOPE SLANT 54X150 (MISCELLANEOUS) IMPLANT
DRSG OPSITE POSTOP 4X6 (GAUZE/BANDAGES/DRESSINGS) IMPLANT
DURAPREP 26ML APPLICATOR (WOUND CARE) ×1 IMPLANT
ELECTRODE REM PT RTRN 9FT ADLT (ELECTROSURGICAL) ×1 IMPLANT
EVACUATOR 1/8 PVC DRAIN (DRAIN) IMPLANT
GAUZE 4X4 16PLY ~~LOC~~+RFID DBL (SPONGE) IMPLANT
GAUZE SPONGE 4X4 12PLY STRL (GAUZE/BANDAGES/DRESSINGS) ×1 IMPLANT
GLOVE ECLIPSE 9.0 STRL (GLOVE) ×1 IMPLANT
GLOVE EXAM NITRILE XL STR (GLOVE) IMPLANT
GOWN STRL REUS W/ TWL LRG LVL3 (GOWN DISPOSABLE) IMPLANT
GOWN STRL REUS W/ TWL XL LVL3 (GOWN DISPOSABLE) ×1 IMPLANT
GOWN STRL REUS W/TWL 2XL LVL3 (GOWN DISPOSABLE) IMPLANT
KIT BASIN OR (CUSTOM PROCEDURE TRAY) ×1 IMPLANT
KIT TURNOVER KIT B (KITS) ×1 IMPLANT
NDL HYPO 22X1.5 SAFETY MO (MISCELLANEOUS) ×1 IMPLANT
NDL SPNL 22GX3.5 QUINCKE BK (NEEDLE) ×1 IMPLANT
NEEDLE HYPO 22X1.5 SAFETY MO (MISCELLANEOUS) ×1 IMPLANT
NEEDLE SPNL 22GX3.5 QUINCKE BK (NEEDLE) ×1 IMPLANT
PACK LAMINECTOMY NEURO (CUSTOM PROCEDURE TRAY) ×1 IMPLANT
PAD ARMBOARD POSITIONER FOAM (MISCELLANEOUS) ×3 IMPLANT
PIN MAYFIELD SKULL DISP (PIN) ×1 IMPLANT
SOLN 0.9% NACL 1000 ML (IV SOLUTION) ×1 IMPLANT
SOLN 0.9% NACL POUR BTL 1000ML (IV SOLUTION) ×1 IMPLANT
SOLN STERILE WATER 1000 ML (IV SOLUTION) ×1 IMPLANT
SOLN STERILE WATER BTL 1000 ML (IV SOLUTION) ×1 IMPLANT
SPONGE SURGIFOAM ABS GEL SZ50 (HEMOSTASIS) ×1 IMPLANT
SPONGE T-LAP 4X18 ~~LOC~~+RFID (SPONGE) IMPLANT
STRIP CLOSURE SKIN 1/2X4 (GAUZE/BANDAGES/DRESSINGS) ×1 IMPLANT
SUT VIC AB 0 CT1 18XCR BRD8 (SUTURE) ×1 IMPLANT
SUT VIC AB 2-0 CT1 18 (SUTURE) ×1 IMPLANT
SUT VIC AB 3-0 SH 8-18 (SUTURE) ×1 IMPLANT
SWAB COLLECTION DEVICE MRSA (MISCELLANEOUS) IMPLANT
TOWEL GREEN STERILE (TOWEL DISPOSABLE) ×1 IMPLANT
TOWEL GREEN STERILE FF (TOWEL DISPOSABLE) ×1 IMPLANT

## 2024-04-19 NOTE — Plan of Care (Signed)
 I spoke with dr Marda PA   Patient with cervical epidural abscess that cause cord compression s/p I&D today Cx sent On vanc/ceftriaxone   No known risk for abscess   -continue current abx -formal note in AM

## 2024-04-19 NOTE — Transfer of Care (Signed)
 Immediate Anesthesia Transfer of Care Note  Patient: David [name]  Procedure(s) Performed: CERVICAL FIVE- SIX-SEVEN LAMINECTOMY FOR EPIDURAL ABSCESS  Patient Location: PACU  Anesthesia Type:General  Level of Consciousness: awake, alert , and oriented  Airway & Oxygen Therapy: Patient Spontanous Breathing  Post-op Assessment: Report given to RN and Post -op Vital signs reviewed and stable  Post vital signs: Reviewed and stable  Last Vitals:  Vitals Value Taken Time  BP 145/68 04/19/24 16:45  Temp    Pulse 75 04/19/24 16:47  Resp 18 04/19/24 16:47  SpO2 97 % 04/19/24 16:47  Vitals shown include unfiled device data.  Last Pain:  Vitals:   04/19/24 1321  TempSrc: Oral  PainSc: 7       Patients Stated Pain Goal: 0 (04/18/24 2100)  Complications: No notable events documented.

## 2024-04-19 NOTE — Anesthesia Procedure Notes (Signed)
 Procedure Name: Intubation Date/Time: 04/19/2024 2:54 PM  Performed by: Nesiah Jump A, CRNAPre-anesthesia Checklist: Patient identified, Emergency Drugs available, Suction available and Patient being monitored Patient Re-evaluated:Patient Re-evaluated prior to induction Oxygen Delivery Method: Circle System Utilized Preoxygenation: Pre-oxygenation with 100% oxygen Induction Type: IV induction Ventilation: Mask ventilation without difficulty Laryngoscope Size: Glidescope and 4 Grade View: Grade II Tube type: Oral Tube size: 7.5 mm Number of attempts: 1 Airway Equipment and Method: Stylet, Oral airway and Video-laryngoscopy Placement Confirmation: ETT inserted through vocal cords under direct vision, positive ETCO2 and breath sounds checked- equal and bilateral Secured at: 22 cm Tube secured with: Tape Dental Injury: Teeth and Oropharynx as per pre-operative assessment  Comments: Neck stabilized during intubation

## 2024-04-19 NOTE — Op Note (Signed)
 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  Asst. Surgeon: Jennetta, NP; assistant utilized during exposure, decompression, closure.  Anesthesia: General  Indication: 73 year old male with spontaneous cervical epidural abscess with severe upper extremity weakness.  MRI scan demonstrates evidence of circumferential inflammatory tissue and likely abscess centered mostly from C6-C7 and is posterior spinal canal.  Patient presents now for decompressive surgery and hopeful establishment of diagnosis.  Operative note: After induction of anesthesia, patient edition prone on the bolsters with his head fixed in [place] pin headrest.  Patient's posterior cervical region prepped and draped sterilely.  Incision made from C5-T1.  Dissection performed bilaterally.  Retractor placed.  X-ray taken.  C5 level was confirmed.  Decompressive laminectomy was then performed using Leksell rongeur's, Kerrison rongeurs and a high-speed drill to remove the entire lamina of C5-C6 and C7.  Ligamentum flavum elevated and resected.  There was no evidence of liquid pus but rather there was phlegmon tightly attached to the thecal sac.  This was dissected free using dental instruments and peeled from the underlying thecal sac.  Cultures were taken of this phlegmon as well as the epidural space.  The phlegmon was tracked out laterally particularly along the course of the proximal C7 nerve roots.  The lateral gutters were cleaned of phlegmon from C5-T1.  No evidence of injury to the thecal sac or nerve roots.  Wound was then irrigated.  Small amount of Gelfoam was placed for hemostasis.  A medium Hemovac drain was left in the deep wound space.  Wound was then  closed in layers of Vicryl sutures.  Steri-Strips and sterile dressing were applied.  No apparent complications.  Patient tolerated the procedure well and he returns to the recovery room postop.

## 2024-04-19 NOTE — Plan of Care (Signed)
  Problem: Education: Goal: Knowledge of General Education information will improve Description: Including pain rating scale, medication(s)/side effects and non-pharmacologic comfort measures Outcome: Progressing   Problem: Health Behavior/Discharge Planning: Goal: Ability to manage health-related needs will improve Outcome: Progressing   Problem: Clinical Measurements: Goal: Ability to maintain clinical measurements within normal limits will improve Outcome: Progressing Goal: Will remain free from infection Outcome: Progressing Goal: Diagnostic test results will improve Outcome: Progressing Goal: Respiratory complications will improve Outcome: Progressing Goal: Cardiovascular complication will be avoided Outcome: Progressing   Problem: Clinical Measurements: Goal: Will remain free from infection Outcome: Progressing   Problem: Clinical Measurements: Goal: Diagnostic test results will improve Outcome: Progressing   Problem: Clinical Measurements: Goal: Respiratory complications will improve Outcome: Progressing   Problem: Activity: Goal: Risk for activity intolerance will decrease Outcome: Progressing   Problem: Clinical Measurements: Goal: Cardiovascular complication will be avoided Outcome: Progressing   Problem: Nutrition: Goal: Adequate nutrition will be maintained Outcome: Progressing   Problem: Nutrition: Goal: Adequate nutrition will be maintained Outcome: Progressing   Problem: Elimination: Goal: Will not experience complications related to bowel motility Outcome: Progressing Goal: Will not experience complications related to urinary retention Outcome: Progressing   Problem: Elimination: Goal: Will not experience complications related to urinary retention Outcome: Progressing

## 2024-04-19 NOTE — Interval H&P Note (Signed)
 History and Physical Interval Note:  04/19/2024 2:03 PM  David [name]  has presented today for surgery, with the diagnosis of CERVICAL EPIDURAL ABSCESS.  The various methods of treatment have been discussed with the patient and family. After consideration of risks, benefits and other options for treatment, the patient has consented to  Procedure(s) with comments: FORAMINOTOMY, SPINE, CERVICAL, 1 LEVEL (N/A) - C6-7 LAM FOR EPIDURAL ABSCESS as a surgical intervention.  The patient's history has been reviewed, patient examined, no change in status, stable for surgery.  I have reviewed the patient's chart and labs.  Questions were answered to the patient's satisfaction.     [facility] [name]

## 2024-04-19 NOTE — H&P (Signed)
 David [name] is an 73 y.o. male.   Chief Complaint: Weakness HPI:  73 year old male presented to my office today with a 3 week history of sudden onset neck pain radiating into his shoulders left greater than right.  Patient is noted progressive radiating pain numbness and weakness into both upper extremities.  The patient denies lower extremity pain numbness or weakness.  He is having no bowel or bladder dysfunction.  On examination today in the office the patient was found to have profound weakness worrisome for cervical myelopathy.  An urgent MRI scan was obtained.  This demonstrates evidence of spondylitic disease predominantly at C5-6 and C6-7 with some early ventral stenosis.  There is fluid signal in the epidural space dorsally from C5-6 through C6-7 worrisome for either cervical epidural abscess or cervical epidural hematoma.  The patient denies history of recent fever.  His pain is not dramatically worsened by movement.  The patient is being admitted for further evaluation and possible decompressive surgery tomorrow.       Past Medical History:  Diagnosis Date   Chicken pox     History of skin cancer     Polyp of colon                 Past Surgical History:  Procedure Laterality Date   APPENDECTOMY                   Family History  Problem Relation Age of Onset   Arthritis Mother     Diabetes Mother     Heart attack Father     Heart attack Brother     Stroke Brother     Hypertension Brother     Hypertension Brother          Social History:  reports that he has never smoked. He has never used smokeless tobacco. He reports current alcohol use. He reports that he does not use drugs.   Allergies:  Allergies      Allergies  Allergen Reactions   Bee Venom Swelling   Honey Bee Venom Swelling   Other Swelling         (Not in a hospital admission)         Lab Results Last 48 Hours  No results found for this or any previous visit (from the past 48 hours).    Imaging Results (Last 48 hours)  No results found.     Pertinent items noted in HPI and remainder of comprehensive ROS otherwise negative.   There were no vitals taken for this visit.     Patient is awake and alert.  He is oriented and appropriate.  He does not appear toxic.   Speech is fluent.  Judgment insight are intact.  Cranial nerve function normal bilaterally.  Motor examination of his upper extremities revealed normal strength in his deltoid muscle group with some mild left-sided biceps weakness.  He has significant weakness of wrist extension on the left grading out at 4- over 5.  He has mild weakness of his right wrist extensors grading at 4+ over 5.  He has profound weakness of his triceps muscle group with 2/5 left triceps strength and 3/5 right triceps strength.  He has serious grip weakness grading at 3/5 bilaterally.  He has intrinsic weakness grading at L4-5 on the right and 4- over 5 on the left.  Lower extremity strength and tone are normal.  Reflexes are normal.  Examination head ears eyes known to David [name].  Chest  and abdomen are benign.  Cervical spine is not particularly tender.  There is no evidence of soft tissue swelling.  Examination of his extremities revealed no evidence of accident or injury. 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  Plan to admit to the hospital for further evaluation with contrasted MRI scan of his cervical spine as well as laboratory studies to evaluate for possible infection.  Tentatively planning for decompressive surgery tomorrow.   David [name] 04/18/2024, 3:22 PM               Routing History

## 2024-04-19 NOTE — Progress Notes (Signed)
 Patient admitted hospital for evaluation of spontaneous cervical epidural process-epidural abscess versus epidural hematoma.  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  Sedimentation rate elevated as was C-reactive protein level.  Plan C5-C6-C7 and T1 decompressive laminectomies with evacuation of epidural abscess.  Cultures will be taken at that time for organization and identification.  Continue IV antibiotics for now.  Currently he is stable.  He still having neck pain.  He still has significant upper extremity weakness but has no significant lower extremity weakness.  He remains continent of bowel and bladder.  I discussed the risks and benefits involved with surgery with both the patient and his daughter.  They appear to understand and agree to proceed.

## 2024-04-19 NOTE — Anesthesia Preprocedure Evaluation (Signed)
 Anesthesia Evaluation  Patient identified by MRN, date of birth, ID band Patient awake    Reviewed: Allergy & Precautions, NPO status , Patient's Chart, lab work & pertinent test results, reviewed documented beta blocker date and time   History of Anesthesia Complications (+) history of anesthetic complications  Airway Mallampati: I       Dental  (+) Poor Dentition, Dental Advisory Given   Pulmonary neg COPD, neg recent URI   breath sounds clear to auscultation       Cardiovascular hypertension, (-) Past MI and (-) Cardiac Stents  Rhythm:Regular Rate:Normal     Neuro/Psych    GI/Hepatic ,neg GERD  ,,  Endo/Other  neg diabetes    Renal/GU Renal disease     Musculoskeletal   Abdominal   Peds  Hematology   Anesthesia Other Findings   Reproductive/Obstetrics                              Anesthesia Physical Anesthesia Plan  ASA: 3  Anesthesia Plan: General   Post-op Pain Management:    Induction:   PONV Risk Score and Plan: 1 and Ondansetron  and Dexamethasone   Airway Management Planned: Oral ETT  Additional Equipment:   Intra-op Plan:   Post-operative Plan: Extubation in OR  Informed Consent:      Dental advisory given  Plan Discussed with: CRNA and Surgeon  Anesthesia Plan Comments:         Anesthesia Quick Evaluation

## 2024-04-19 NOTE — Brief Op Note (Signed)
 04/18/2024 - 04/19/2024  4:25 PM  PATIENT:  David [name]  73 y.o. male  PRE-OPERATIVE DIAGNOSIS:  CERVICAL EPIDURAL ABSCESS  POST-OPERATIVE DIAGNOSIS:  CERVICAL EPIDURAL ABSCESS  PROCEDURE:  Procedure(s) with comments: FORAMINOTOMY, SPINE, CERVICAL, 1 LEVEL (N/A) - C6-7 LAM FOR EPIDURAL ABSCESS  SURGEON:  Surgeons and Role:    DEWAINE Louis Shove, MD - Primary  PHYSICIAN ASSISTANT:   ASSISTANTSBETHA Jennetta PIETY   ANESTHESIA:   general  EBL:  200 mL   BLOOD ADMINISTERED:none  DRAINS: (med) Hemovact drain(s) in the deep wound space with  Suction Open   LOCAL MEDICATIONS USED:  MARCAINE      SPECIMEN:  No Specimen  DISPOSITION OF SPECIMEN:  N/A  COUNTS:  YES  TOURNIQUET:  * No tourniquets in log *  DICTATION: .Dragon Dictation  PLAN OF CARE: Admit to inpatient   PATIENT DISPOSITION:  PACU - hemodynamically stable.   Delay start of Pharmacological VTE agent (>24hrs) due to surgical blood loss or risk of bleeding: yes

## 2024-04-19 NOTE — H&P (View-Only) (Signed)
 Patient admitted hospital for evaluation of spontaneous cervical epidural process-epidural abscess versus epidural hematoma.  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  Sedimentation rate elevated as was C-reactive protein level.  Plan C5-C6-C7 and T1 decompressive laminectomies with evacuation of epidural abscess.  Cultures will be taken at that time for organization and identification.  Continue IV antibiotics for now.  Currently he is stable.  He still having neck pain.  He still has significant upper extremity weakness but has no significant lower extremity weakness.  He remains continent of bowel and bladder.  I discussed the risks and benefits involved with surgery with both the patient and his daughter.  They appear to understand and agree to proceed.

## 2024-04-20 DIAGNOSIS — M4623 Osteomyelitis of vertebra, cervicothoracic region: Secondary | ICD-10-CM

## 2024-04-20 DIAGNOSIS — M4802 Spinal stenosis, cervical region: Secondary | ICD-10-CM

## 2024-04-20 LAB — GLUCOSE, CAPILLARY
Glucose-Capillary: 129 mg/dL — ABNORMAL HIGH (ref 70–99)
Glucose-Capillary: 126 mg/dL — ABNORMAL HIGH (ref 70–99)
Glucose-Capillary: 127 mg/dL — ABNORMAL HIGH (ref 70–99)
Glucose-Capillary: 129 mg/dL — ABNORMAL HIGH (ref 70–99)

## 2024-04-20 NOTE — Evaluation (Signed)
 Physical Therapy Evaluation Patient Details Name: David [name] MRN: [id number] DOB: [birth year] Today's Date: 04/20/2024  History of Present Illness  David [name] is a 73 y.o. 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 Pt s/p C5-C6-C7 decompressive laminectomy with evacuation of cervical epidural abscess 9/26. PMHx: HTN, GERD, colon polyp, and skin cancer.   Clinical Impression  Pt admitted with above diagnosis. PTA, pt was independent with functional mobility, ADLs, and IADLs. He lives with his daughter in a one story house with 1 STE. Pt currently with functional limitations due to the deficits listed below (see PT Problem List). He required CGA-minA for bed mobility, CGA for sit<>stand using RW, CGA for gait using RW, and CGA for stairs with 1 HHA. Pt recalled his cervical precautions. Pt will benefit from acute skilled PT to increase his independence and safety with mobility to allow discharge.  Recommend HHPT to increase activity tolerance, improve balance, decrease fall risk, and optimize safety within the home environment.     If plan is discharge home, recommend the following: A little help with walking and/or transfers;A little help with bathing/dressing/bathroom;Assistance with cooking/housework;Assist for transportation;Help with stairs or ramp for entrance   Can travel by private vehicle        Equipment Recommendations Rolling walker (2 wheels)  Recommendations for Other Services       Functional Status Assessment Patient has had a recent decline in their functional status and demonstrates the ability to make significant improvements in function in a reasonable and predictable amount of time.     Precautions / Restrictions Precautions Precautions: Cervical Precaution Booklet Issued: Yes (comment) Recall of Precautions/Restrictions: Intact Precaution/Restrictions Comments: Hemovac drain; No brace needed Restrictions Weight Bearing  Restrictions Per Provider Order: No      Mobility  Bed Mobility Overal bed mobility: Needs Assistance Bed Mobility: Rolling, Sidelying to Sit, Sit to Sidelying Rolling: Supervision, Used rails Sidelying to sit: Min assist, HOB elevated, Used rails     Sit to sidelying: Contact guard assist, HOB elevated General bed mobility comments: Reviewed log roll technique. Pt brought BLE off EOB. Assist to elevate trunk and scoot fwd. Returning to bed assist to bring BLE back into bed and scooter higher in bed.    Transfers Overall transfer level: Needs assistance Equipment used: Rolling walker (2 wheels) Transfers: Sit to/from Stand Sit to Stand: Contact guard assist           General transfer comment: Pt stood from lowest bed height. Demonstrated proper hand placement using RW. Powered up with CGA. Good eccentric control with sitting.    Ambulation/Gait Ambulation/Gait assistance: Contact guard assist Gait Distance (Feet): 200 Feet Assistive device: Rolling walker (2 wheels) Gait Pattern/deviations: Step-through pattern, Decreased stride length Gait velocity: reduced Gait velocity interpretation: <1.8 ft/sec, indicate of risk for recurrent falls   General Gait Details: Pt ambualted with a reciprocal gait pattern, even weight shift, and good foot clearence. He maintained netural head position, upright posture, and good proximity to RW. No LOB.  Stairs Stairs: Yes Stairs assistance: Contact guard assist Stair Management: Step to pattern (with 1 HHA) Number of Stairs: 2 General stair comments: Pt ascended and descended leading with RLE. 1 HHA provided to RUE. Discussed how he should complete steps in accordance with his home set-up and how family should be positioned to support him.  Wheelchair Mobility     Tilt Bed    Modified Rankin (Stroke Patients Only)       Balance Overall balance  assessment: Needs assistance Sitting-balance support: Bilateral upper extremity  supported, Feet supported Sitting balance-Leahy Scale: Good     Standing balance support: Bilateral upper extremity supported, During functional activity, Reliant on assistive device for balance Standing balance-Leahy Scale: Poor Standing balance comment: Pt dependent on RW                             Pertinent Vitals/Pain Pain Assessment Pain Assessment: 0-10 Pain Score: 5  Pain Location: Neck Pain Descriptors / Indicators: Discomfort, Aching, Sore Pain Intervention(s): Premedicated before session, Monitored during session, Repositioned    Home Living Family/patient expects to be discharged to:: Private residence Living Arrangements: Children;Other relatives Available Help at Discharge: Family;Available 24 hours/day (Daughters and other family can provide full coverage, one works from home, other works 2 days/week) Type of Home: [employer] Home Access: Stairs to enter Entrance Stairs-Rails: None Secretary/administrator of Steps: 1 step from outside into the kitchen   Home Layout: One level Home Equipment: Rollator (4 wheels)      Prior Function Prior Level of Function : Independent/Modified Independent             Mobility Comments: Ambulated without AD. Denies fall history. Pt reports sleeping in recliner chair for past 3 weeks d/t pain. ADLs Comments: Indep with ADLs/IADLs. Daughters report providing increased assist prn d/t pain.     Extremity/Trunk Assessment   Upper Extremity Assessment Upper Extremity Assessment: Defer to OT evaluation RUE Deficits / Details: Digit AROM WFLS with grip 3+/5, elbow AROM WFLS, shoulder flexion 0-130 degrees with gross testing and adherence to cervical precautions.  Did not assess shoulder or elbow strength but pt report weakness in the shoulders RUE Sensation:  (Pt reports numbness, tingling, and pain in the digits) RUE Coordination: decreased fine motor;decreased gross motor LUE Deficits / Details: Digit AROM approximately  90% of full for flexion.  Able to oppose thumb to all digits with grip at 3/5.  Shoulder flexion AROM 0-80 degrees with AAROM 0-130, stopping secondary to cervical precautions.  Elbow AROM for flexion and extension WFLs. LUE Sensation:  (Pt reports, numbness, tingling, and pain in digits) LUE Coordination: decreased fine motor;decreased gross motor    Lower Extremity Assessment Lower Extremity Assessment: Overall WFL for tasks assessed    Cervical / Trunk Assessment Cervical / Trunk Assessment: Neck Surgery  Communication   Communication Communication: No apparent difficulties    Cognition Arousal: Alert Behavior During Therapy: WFL for tasks assessed/performed   PT - Cognitive impairments: No apparent impairments                       PT - Cognition Comments: Pt A,Ox4 Following commands: Intact       Cueing Cueing Techniques: Verbal cues, Gestural cues     General Comments General comments (skin integrity, edema, etc.): VSS on RA    Exercises     Assessment/Plan    PT Assessment Patient needs continued PT services  PT Problem List Decreased balance;Decreased mobility;Decreased knowledge of use of DME       PT Treatment Interventions DME instruction;Gait training;Stair training;Functional mobility training;Therapeutic activities;Therapeutic exercise;Balance training;Patient/family education    PT Goals (Current goals can be found in the Care Plan section)  Acute Rehab PT Goals Patient Stated Goal: Regain independence and return home PT Goal Formulation: With patient/family Time For Goal Achievement: 05/04/24 Potential to Achieve Goals: Good    Frequency Min 2X/week  Co-evaluation               AM-PAC PT 6 Clicks Mobility  Outcome Measure Help needed turning from your back to your side while in a flat bed without using bedrails?: A Little Help needed moving from lying on your back to sitting on the side of a flat bed without using  bedrails?: A Little Help needed moving to and from a bed to a chair (including a wheelchair)?: A Little Help needed standing up from a chair using your arms (e.g., wheelchair or bedside chair)?: A Little Help needed to walk in hospital room?: A Little Help needed climbing 3-5 steps with a railing? : A Little 6 Click Score: 18    End of Session Equipment Utilized During Treatment: Gait belt Activity Tolerance: Patient tolerated treatment well Patient left: in bed;with call bell/phone within reach;with family/visitor present Nurse Communication: Mobility status PT Visit Diagnosis: Other abnormalities of gait and mobility (R26.89);Unsteadiness on feet (R26.81)    Time: 8457-8398 PT Time Calculation (min) (ACUTE ONLY): 19 min   Charges:   PT Evaluation $PT Eval Moderate Complexity: 1 Mod   PT General Charges $$ ACUTE PT VISIT: 1 Visit         Randall SAUNDERS, PT, DPT Acute Rehabilitation Services Office: [phone] Secure Chat Preferred  Delon CHRISTELLA Callander 04/20/2024, 5:05 PM

## 2024-04-20 NOTE — Plan of Care (Signed)
  Problem: Education: Goal: Knowledge of General Education information will improve Description: Including pain rating scale, medication(s)/side effects and non-pharmacologic comfort measures Outcome: Progressing   Problem: Clinical Measurements: Goal: Will remain free from infection Outcome: Progressing   Problem: Nutrition: Goal: Adequate nutrition will be maintained Outcome: Progressing   Problem: Coping: Goal: Level of anxiety will decrease Outcome: Progressing   Problem: Skin Integrity: Goal: Risk for impaired skin integrity will decrease Outcome: Progressing   

## 2024-04-20 NOTE — Consult Note (Signed)
 [facility]    Date of Admission:  04/18/2024     Reason for Consult: cervical spine om    Referring Provider: Victory Gunnels     Lines:  Peripheral iv's  Abx: 9/25-c vanc 9/25-c ceftriaxone          Assessment: 73 yo male with acute 3 week progressive sudden neck pain with numbness/weakness of upper extremities, found to have c4-t1 vertebral om with spondylopathic changes s/p urgent decompression surgery 9/26  Suspect hematogenous spread of bacteria causing cspine OM. Commonly we see staph aureus in these cases   One red herring to keep in mind is that he reported about 4-5 weeks of weight loss prior to admission. He has no obvious tb risk factor or things such as q-fever/brucella. No preceeding illness to suggest an endemic fungal process. But would revisit these possibilities if cultures are negative and empiric abx doesn't improve his infection  9/25 presenting esr 64; hsCRP 2.7  No prior abx within the past few months 9/26 operative cx in progress No blood cx. Patient didn't have sign of sepsis/sirs and there was no leukocytosis this admission  Of note, 9/25 cspine mri also mentions heterogeneous marrow signal intensity with marked T1 hypointensity within the C6, C7, and T2 vertebral bodies, raising the concern for possible underlying pathologic lesions. This will need to be followed up. Or consider skeletal survey/discussing with onc about multiple myeloma or prostate cancer w/u  Of the CRAB that suggest MM, he has anemia and ?bony lesion; his renal function and calcium are normal    Plan: Consider asking IM to follow and see if certain c spine potential non-id pathologic lesions need to be worked up Due to the several weaks of weight loss, I have called micro to add on afb/fungal cultures -- they do have enough sample to send Continue empiric vanc/ceftriaxone  Continue standard isolation precaution No b sx and course history doesn't  suggest to w/u for more exotic process such as tb/fungal New id team to see on monday      ------------------------------------------------ Principal Problem:   Stenosis of cervical spine with myelopathy (HCC)    HPI: David [name] is a 73 y.o. male with mitral valve prolapse, otherwise healthy, presented with 4-5 weeks weight loss, acute 3 week progressive sudden neck pain with numbness/weakness of upper extremities, found to have c4-t1 vertebral om with spondylopathic changes s/p urgent decompression surgery 9/26   He doesn't have any obvious tb exposure Weight loss but intact appetite. 230s-->190s No f/c/nightsweat/malaise  Due to progression neck pain and paresthesia/upper ext weakness came for evaluation  Doesn't have rash, other medical procedure/dental procedure, recent pneumonia/uri, or other acute illness No recent abx  Doesn't live near farm/livestock   This presentation Afebrile Wbc 8.5 Mri as below  Dr Malcolm of nsg took to operative room and I reviewed op finding: Decompressive laminectomy was then performed using Leksell rongeur's, Kerrison rongeurs and a high-speed drill to remove the entire lamina of C5-C6 and C7. Ligamentum flavum elevated and resected. There was no evidence of liquid pus but rather there was phlegmon tightly attached to the thecal sac. This was dissected free using dental instruments and peeled from the underlying thecal sac. Cultures were taken of this phlegmon as well as the epidural space. The phlegmon was tracked out laterally particularly along the course of the proximal C7 nerve roots. The lateral gutters were cleaned of phlegmon from C5-T1. No evidence of injury to  the thecal sac or nerve roots. Wound was then irrigated. Small amount of Gelfoam was placed for hemostasis.    His severe neck pain and arms pain had improved No urinary incontinence/retention No lower ext weakness Still some paresthesia in arms  Family History  Problem  Relation Age of Onset   Arthritis Mother    Diabetes Mother    Heart attack Father    Heart attack Brother    Stroke Brother    Hypertension Brother    Hypertension Brother     Social History   Tobacco Use   Smoking status: Never   Smokeless tobacco: Never  Vaping Use   Vaping status: Never Used  Substance Use Topics   Alcohol use: Yes    Comment: occasionally   Drug use: No    Allergies  Allergen Reactions   Bee Venom Swelling   Honey Bee Venom Swelling    Review of Systems: ROS All Other ROS was negative, except mentioned above   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   Chicken pox    History of skin cancer    Polyp of colon        Scheduled Meds:  gabapentin   300 mg Oral TID   insulin  aspart  0-15 Units Subcutaneous TID WC   metoprolol  succinate  100 mg Oral Daily   mupirocin  ointment  1 Application Nasal BID   sodium chloride  flush  3 mL Intravenous Q12H   sodium chloride  flush  3 mL Intravenous Q12H   Continuous Infusions:  cefTRIAXone  (ROCEPHIN )  IV Stopped (04/18/24 1826)   vancomycin  1,250 mg (04/20/24 1246)   PRN Meds:.acetaminophen  **OR** acetaminophen , HYDROcodone -acetaminophen , HYDROmorphone  (DILAUDID ) injection, methocarbamol  (ROBAXIN ) injection, ondansetron  **OR** ondansetron  (ZOFRAN ) IV, sodium chloride  flush   OBJECTIVE: Blood pressure (!) 119/57, pulse 64, temperature 98.4 F (36.9 C), temperature source Oral, resp. rate 16, height 6' (1.829 m), weight 89.4 kg, SpO2 94%.  Physical Exam  General/constitutional: no distress, pleasant HEENT: Normocephalic, PER, Conj Clear, EOMI, Oropharynx clear Neck supple CV: rrr no mrg Lungs: clear to auscultation, normal respiratory effort Abd: Soft, Nontender Ext: no edema Skin: No Rash Neuro/msk; slight weakness in grips bilaterally no obvious assymetry; dressing c/d/i   Lab Results Lab Results  Component Value Date   WBC 8.5  04/18/2024   HGB 11.3 (L) 04/18/2024   HCT 35.5 (L) 04/18/2024   MCV 89.4 04/18/2024   PLT 258 04/18/2024    Lab Results  Component Value Date   CREATININE 0.80 04/18/2024   BUN 18 04/18/2024   NA 138 04/18/2024   K 4.3 04/18/2024   CL 100 04/18/2024   CO2 28 04/18/2024    Lab Results  Component Value Date   ALT 22 08/15/2018   AST 20 08/15/2018   ALKPHOS 70 08/15/2018   BILITOT 0.8 08/15/2018      Microbiology: Recent Results (from the past 240 hours)  Surgical pcr screen     Status: None   Collection Time: 04/19/24  9:57 AM   Specimen: Nasal Mucosa; Nasal Swab  Result Value Ref Range Status   MRSA, PCR NEGATIVE NEGATIVE Final   Staphylococcus aureus NEGATIVE NEGATIVE Final    Comment: (NOTE) The Xpert SA Assay (FDA approved for NASAL specimens in patients 41 years of age and older), is one component of a comprehensive surveillance program. It is not intended to diagnose infection nor to guide or monitor treatment. Performed at [facility],  [address] [place], [place] [address]   Aerobic Culture w Gram Stain (superficial specimen)     Status: None (Preliminary result)   Collection Time: 04/19/24  3:29 PM   Specimen: Wound; Abscess  Result Value Ref Range Status   Specimen Description WOUND  Final   Special Requests POSTERIOR EPIDURAL CERVICAL SPINE SWAB  Final   Gram Stain NO WBC SEEN NO ORGANISMS SEEN   Final   Culture   Final    NO GROWTH < 24 HOURS Performed at [facility], [address] [place], [place] [address]    Report Status PENDING  Incomplete     Serology:    Imaging: If present, new imagings (plain films, ct scans, and mri) have been personally visualized and interpreted; radiology reports have been reviewed. Decision making incorporated into the Impression / Recommendations.  9/25 mri c spine 1. 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 Associated epidural phlegmon/abscess extending from C5  through C8-T1. Resultant severe spinal stenosis with secondary cord flattening and subtle cord signal changes, concerning for edema. 2. Abnormal marrow edema and enhancement about the right C5-6 through C7-T1 facets as well as the left C6-7 facet, also likely infected. Suspected additional changes of osteomyelitis within the C4 and C5 vertebral bodies, as well as the spinous processes of C5 through T1. 3. Underlying heterogeneous marrow signal intensity with marked T1 hypointensity within the C6, C7, and T2 vertebral bodies. Findings raise the concern for possible underlying pathologic lesions, including Mets or myeloma. Difficult to evaluate these findings given the superimposed acute infection. Correlation with history and laboratory values recommended. Short interval follow-up MRI following treatment for the acute infection is suggested. Additionally, further imaging of the remaining spine may be helpful as well. 4. Underlying multilevel cervical spondylosis with resultant mild to moderate diffuse spinal stenosis at C3-4 through C6-7. Associated moderate to severe left C4 and C5 foraminal narrowing, with moderate right C6 foraminal stenosis.   David ONEIDA Passer, MD [facility] [phone] pager    04/20/2024, 1:28 PM

## 2024-04-20 NOTE — Progress Notes (Signed)
 NEUROSURGERY PROGRESS NOTE  Doing well. Complains of appropriate neck soreness. No arm pain He does endorse some tingling in his hands which he thinks is a little worse since surgery. Continues to have weakness in his arms. Incision CDI  Temp:  [98.1 F (36.7 C)-98.6 F (37 C)] 98.4 F (36.9 C) (09/27 0417) Pulse Rate:  [59-80] 66 (09/27 0417) Resp:  [11-17] 15 (09/27 0417) BP: (130-163)/(56-83) 136/59 (09/27 0417) SpO2:  [92 %-100 %] 96 % (09/27 0417) Weight:  [89.4 kg] 89.4 kg (09/26 1321)  Plan: I would like to keep a close eye on his hand and arm strength as well as the tingling.  These call if there is any worsening.-Work with therapy today.  Suzen Chiquita Pean, NP 04/20/2024 7:18 AM

## 2024-04-20 NOTE — Evaluation (Signed)
 Occupational Therapy Evaluation Patient Details Name: David [name] MRN: [id number] DOB: 1950-09-23 Today's Date: 04/20/2024   History of Present Illness   Patient is a 73 yr old male admitted for evaluation of spontaneous cervical epidural process-epidural abscess versus epidural hematoma.  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 Underwent C5-C6-C7 decompressive laminectomies with evacuation of epidural abscess via Dr Louis on 9/26.     Clinical Impressions Pt currently at min guard assist for mobility with use of the RW for transfers to and from the bathroom and for washing hands at the sink.  Min to mod assist for LB selfcare sit to stand with decreased BUE hand strength as well as numbness, tingling, and pain reported bilaterally.  Shoulder weakness also noted but not extensively tested beyond some ROM secondary to patient having cervical precautions.  Prior to this event pt was independent, working, and living with his daughter.  Feel he will benefit from acute care OT to help increase balance, safety, and awareness of precautions with completion of ADL tasks.  Recommend HHOT to continue progression to modified independent post acute stay.  Per patient and family, he will have 24 hour supervision.     If plan is discharge home, recommend the following:   A little help with walking and/or transfers;A lot of help with bathing/dressing/bathroom;Help with stairs or ramp for entrance;Assist for transportation     Functional Status Assessment   Patient has had a recent decline in their functional status and demonstrates the ability to make significant improvements in function in a reasonable and predictable amount of time.     Equipment Recommendations   BSC/3in1      Precautions/Restrictions   Precautions Precautions: Cervical Precaution Booklet  Issued: Yes (comment) Recall of Precautions/Restrictions: Impaired Restrictions Weight Bearing Restrictions Per Provider Order: No     Mobility Bed Mobility Overal bed mobility: Needs Assistance Bed Mobility: Rolling, Sidelying to Sit Rolling: Min assist Sidelying to sit: Mod assist       General bed mobility comments: Mod assist to bring trunk up to sitting from the right side    Transfers Overall transfer level: Needs assistance Equipment used: Rolling walker (2 wheels) Transfers: Sit to/from Stand, Bed to chair/wheelchair/BSC Sit to Stand: Contact guard assist     Step pivot transfers: Contact guard assist     General transfer comment: Min instructional cueing for hand placement with sit to stand and stand to sit transitions.      Balance Overall balance assessment: Needs assistance Sitting-balance support: Bilateral upper extremity supported, Feet supported Sitting balance-Leahy Scale: Good     Standing balance support: Bilateral upper extremity supported, During functional activity, Reliant on assistive device for balance Standing balance-Leahy Scale: Fair Standing balance comment: Pt needs some UE support with mobility                           ADL either performed or assessed with clinical judgement   ADL Overall ADL's : Needs assistance/impaired Eating/Feeding: Supervision/ safety;Sitting   Grooming: Wash/dry hands;Contact guard assist;Standing   Upper Body Bathing: Minimal assistance;Sitting   Lower Body Bathing: Moderate assistance;Sit to/from stand   Upper Body Dressing : Minimal assistance;Sitting   Lower Body Dressing: Maximal assistance;Sit to/from stand   Toilet Transfer: Contact guard assist;Comfort height toilet;Rolling walker (2 wheels)   Toileting- Clothing Manipulation and Hygiene: Minimal assistance;Sit to/from stand Toileting -  Clothing Manipulation Details (indicate cue type and reason): assist with pulling up clothing in  the back     Functional mobility during ADLs: Contact guard assist General ADL Comments: Pt's daughter present and reports 24 hour supervision initially from her, her sister, and other family PRN.  Pt with decreased gross grasp strength.  Issued foam ball. Also provided education and review on cervical precaution handout and available AE handout but did not demonstrate or practice.  Can provide education next visit.  Pt and daughter in agreement for need of 3:1 at home and will check with other family members on shower seat or bench.     Vision Baseline Vision/History: 0 No visual deficits Ability to See in Adequate Light: 0 Adequate Patient Visual Report: No change from baseline Vision Assessment?: No apparent visual deficits     Perception Perception: Within Functional Limits       Praxis Praxis: WFL       Pertinent Vitals/Pain Pain Assessment Pain Assessment: 0-10 Pain Score: 8  Pain Location: fingers and upper back Pain Descriptors / Indicators: Tingling Pain Intervention(s): Monitored during session, Repositioned     Extremity/Trunk Assessment Upper Extremity Assessment Upper Extremity Assessment: RUE deficits/detail;LUE deficits/detail RUE Deficits / Details: Digit AROM WFLS with grip 3+/5, elbow AROM WFLS, shoulder flexion 0-130 degrees with gross testing and adherence to cervical precautions.  Did not assess shoulder or elbow strength but pt report weakness in the shoulders RUE Sensation:  (Pt reports numbness, tingling, and pain in the digits) RUE Coordination: decreased fine motor;decreased gross motor LUE Deficits / Details: Digit AROM approximately 90% of full for flexion.  Able to oppose thumb to all digits with grip at 3/5.  Shoulder flexion AROM 0-80 degrees with AAROM 0-130, stopping secondary to cervical precautions.  Elbow AROM for flexion and extension WFLs. LUE Sensation:  (Pt reports, numbness, tingling, and pain in digits) LUE Coordination: decreased fine  motor;decreased gross motor   Lower Extremity Assessment Lower Extremity Assessment: Defer to PT evaluation   Cervical / Trunk Assessment Cervical / Trunk Assessment: Neck Surgery   Communication Communication Communication: No apparent difficulties   Cognition Arousal: Alert Behavior During Therapy: WFL for tasks assessed/performed Cognition: History of cognitive impairments             OT - Cognition Comments: Pt with what he reports is a history of difficulty remembering things since an accident years ago.                 Following commands: Intact       Cueing  General Comments   Cueing Techniques: Verbal cues              Home Living Family/patient expects to be discharged to:: Private residence Living Arrangements: Children (daughter works 2 days a week but other children will come in and provide 24 hr) Available Help at Discharge: Family;Available 24 hours/day Type of Home: House Home Access: Stairs to enter [employer] of Steps: 1 step from outside into the kitchen   Home Layout: One level     Bathroom Shower/Tub: Tub/shower unit;Curtain   Firefighter: Standard     Home Equipment: Rollator (4 wheels)          Prior Functioning/Environment Prior Level of Function : Independent/Modified Independent             Mobility Comments: Pt was independent without assistive device      OT Problem List: Decreased strength;Decreased range of motion;Decreased coordination;Impaired balance (sitting and/or  standing);Pain;Impaired UE functional use;Decreased knowledge of use of DME or AE   OT Treatment/Interventions: Self-care/ADL training;Patient/family education;Therapeutic exercise;Balance training;Therapeutic activities;DME and/or AE instruction      OT Goals(Current goals can be found in the care plan section)   Acute Rehab OT Goals Patient Stated Goal: Pt wants to get back to being able to work. OT Goal Formulation:  With patient/family Time For Goal Achievement: 05/04/24 Potential to Achieve Goals: Good   OT Frequency:  Min 2X/week       AM-PAC OT 6 Clicks Daily Activity     Outcome Measure Help from another person eating meals?: A Little Help from another person taking care of personal grooming?: A Little Help from another person toileting, which includes using toliet, bedpan, or urinal?: A Little Help from another person bathing (including washing, rinsing, drying)?: A Lot Help from another person to put on and taking off regular upper body clothing?: A Little Help from another person to put on and taking off regular lower body clothing?: A Lot 6 Click Score: 16   End of Session Equipment Utilized During Treatment: Gait belt;Rolling walker (2 wheels) Nurse Communication: Mobility status  Activity Tolerance: Patient tolerated treatment well Patient left: in chair;with call bell/phone within reach;with family/visitor present  OT Visit Diagnosis: Unsteadiness on feet (R26.81);Other abnormalities of gait and mobility (R26.89);Muscle weakness (generalized) (M62.81)                Time: 8844-8769 OT Time Calculation (min): 35 min Charges:  OT General Charges $OT Visit: 1 Visit OT Evaluation $OT Eval Moderate Complexity: 1 Mod OT Treatments $Self Care/Home Management : 8-22 mins  Lynwood Constant, OTR/L Acute Rehabilitation Services  Office [phone] 04/20/2024

## 2024-04-21 LAB — BASIC METABOLIC PANEL WITH GFR
Anion gap: 10 (ref 5–15)
BUN: 17 mg/dL (ref 8–23)
CO2: 25 mmol/L (ref 22–32)
Calcium: 9.3 mg/dL (ref 8.9–10.3)
Chloride: 98 mmol/L (ref 98–111)
Creatinine, Ser: 0.79 mg/dL (ref 0.61–1.24)
GFR, Estimated: >60 mL/min (ref >60)
Glucose, Bld: 106 mg/dL — ABNORMAL HIGH (ref 70–99)
Potassium: 3.9 mmol/L (ref 3.5–5.1)
Sodium: 133 mmol/L — ABNORMAL LOW (ref 135–145)

## 2024-04-21 LAB — GLUCOSE, CAPILLARY
Glucose-Capillary: 107 mg/dL — ABNORMAL HIGH (ref 70–99)
Glucose-Capillary: 119 mg/dL — ABNORMAL HIGH (ref 70–99)
Glucose-Capillary: 205 mg/dL — ABNORMAL HIGH (ref 70–99)
Glucose-Capillary: 212 mg/dL — ABNORMAL HIGH (ref 70–99)

## 2024-04-21 MED ORDER — DEXAMETHASONE SODIUM PHOSPHATE 4 MG/ML IJ SOLN
4.0000 mg | Freq: Four times a day (QID) | INTRAMUSCULAR | Status: AC
  Administered 2024-04-21 – 2024-04-22 (×4): 4 mg via INTRAVENOUS
  Filled 2024-04-21 (×4): qty 1

## 2024-04-21 MED ORDER — DOCUSATE SODIUM 100 MG PO CAPS
100.0000 mg | ORAL_CAPSULE | Freq: Every day | ORAL | Status: DC
  Administered 2024-04-21 – 2024-04-26 (×6): 100 mg via ORAL
  Filled 2024-04-21 (×6): qty 1

## 2024-04-21 NOTE — Progress Notes (Signed)
 NEUROSURGERY PROGRESS NOTE  Doing well. Complains of appropriate neck soreness. No arm pain Still has numbness and tingling in hands, unchanged Ambulating and voiding well Incision CDI  Temp:  [98 F (36.7 C)-98.8 F (37.1 C)] 98.3 F (36.8 C) (09/28 0727) Pulse Rate:  [55-64] 61 (09/28 0727) Resp:  [16] 16 (09/27 1438) BP: (111-124)/(44-53) 116/51 (09/28 0727) SpO2:  [94 %-97 %] 94 % (09/28 0727)  Plan: Continue therapy, will try some steroids today. Leave drain one more day and likely dc tomorrow   Suzen Chiquita Pean, NP 04/21/2024 8:51 AM

## 2024-04-21 NOTE — Progress Notes (Signed)
 Mobility Specialist: Progress Note   04/21/24 1600  Mobility  Activity Ambulated with assistance  Level of Assistance Contact guard assist, steadying assist  Assistive Device Front wheel walker  Distance Ambulated (ft) 400 ft  Activity Response Tolerated well  Mobility Referral Yes  Mobility visit 1 Mobility  Mobility Specialist Start Time (ACUTE ONLY) 1550  Mobility Specialist Stop Time (ACUTE ONLY) 1600  Mobility Specialist Time Calculation (min) (ACUTE ONLY) 10 min    Pt received in chair, pleasant and agreeable to mobility session. Family present and helpful. CGA throughout. C/o neck pain/discomfort. A little faituged after walk but otherwise feeling pretty good. Returned to room. Left in chair with all needs met, call bell in reach.   Ileana Lute Mobility Specialist Please contact via SecureChat or Rehab office at [phone]

## 2024-04-21 NOTE — Plan of Care (Signed)
  Problem: Health Behavior/Discharge Planning: Goal: Ability to manage health-related needs will improve Outcome: Progressing   Problem: Clinical Measurements: Goal: Ability to maintain clinical measurements within normal limits will improve Outcome: Progressing   Problem: Elimination: Goal: Will not experience complications related to urinary retention Outcome: Progressing   Problem: Pain Managment: Goal: General experience of comfort will improve and/or be controlled Outcome: Progressing

## 2024-04-22 ENCOUNTER — Other Ambulatory Visit: Payer: Self-pay

## 2024-04-22 ENCOUNTER — Encounter (HOSPITAL_COMMUNITY): Payer: Self-pay | Admitting: Neurosurgery

## 2024-04-22 LAB — CK: Total CK: 23 U/L — ABNORMAL LOW (ref 49–397)

## 2024-04-22 LAB — AEROBIC CULTURE W GRAM STAIN (SUPERFICIAL SPECIMEN)
Culture: NO GROWTH
Gram Stain: NONE SEEN

## 2024-04-22 LAB — BASIC METABOLIC PANEL WITH GFR
Anion gap: 8 (ref 5–15)
BUN: 15 mg/dL (ref 8–23)
CO2: 25 mmol/L (ref 22–32)
Calcium: 9.1 mg/dL (ref 8.9–10.3)
Chloride: 104 mmol/L (ref 98–111)
Creatinine, Ser: 0.8 mg/dL (ref 0.61–1.24)
GFR, Estimated: >60 mL/min (ref >60)
Glucose, Bld: 154 mg/dL — ABNORMAL HIGH (ref 70–99)
Potassium: 4 mmol/L (ref 3.5–5.1)
Sodium: 137 mmol/L (ref 135–145)

## 2024-04-22 LAB — GLUCOSE, CAPILLARY
Glucose-Capillary: 154 mg/dL — ABNORMAL HIGH (ref 70–99)
Glucose-Capillary: 124 mg/dL — ABNORMAL HIGH (ref 70–99)
Glucose-Capillary: 132 mg/dL — ABNORMAL HIGH (ref 70–99)
Glucose-Capillary: 157 mg/dL — ABNORMAL HIGH (ref 70–99)

## 2024-04-22 MED ORDER — DAPTOMYCIN-SODIUM CHLORIDE 700-0.9 MG/100ML-% IV SOLN
8.0000 mg/kg | Freq: Every day | INTRAVENOUS | Status: DC
Start: 2024-04-22 — End: 2024-04-26
  Administered 2024-04-22 – 2024-04-26 (×5): 700 mg via INTRAVENOUS
  Filled 2024-04-22 (×6): qty 100

## 2024-04-22 NOTE — Care Management Important Message (Signed)
 Important Message  Patient Details  Name: David [name] MRN: [id number] Date of Birth: Feb 09, 1951   Important Message Given:  Yes - Medicare IM     Jon Cruel 04/22/2024, 12:56 PM

## 2024-04-22 NOTE — Plan of Care (Signed)

## 2024-04-22 NOTE — Progress Notes (Signed)
 Occupational Therapy Treatment Patient Details Name: David [name] MRN: [id number] DOB: 09-13-1950 Today's Date: 04/22/2024   History of present illness David [name] is a 73 y.o. 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 Pt s/p C5-C6-C7 decompressive laminectomy with evacuation of cervical epidural abscess 9/26. PMHx: HTN, GERD, colon polyp, and skin cancer.   OT comments  Pt progressing toward established OT goals. Reviewed precautions at beginning of session with pt with good recall. Focus session on return to LB ADL and FM therapy putty exercises as outlined below. HEP provided. Will follow acutely.       If plan is discharge home, recommend the following:  A little help with walking and/or transfers;A lot of help with bathing/dressing/bathroom;Help with stairs or ramp for entrance;Assist for transportation   Equipment Recommendations  BSC/3in1    Recommendations for Other Services      Precautions / Restrictions Precautions Precautions: Cervical Precaution Booklet Issued: Yes (comment) Recall of Precautions/Restrictions: Intact Precaution/Restrictions Comments: Hemovac drain; No brace needed Restrictions Weight Bearing Restrictions Per Provider Order: No       Mobility Bed Mobility               General bed mobility comments: in recliner    Transfers Overall transfer level: Needs assistance Equipment used: Rolling walker (2 wheels) Transfers: Sit to/from Stand Sit to Stand: Contact guard assist                 Balance Overall balance assessment: Needs assistance Sitting-balance support: Bilateral upper extremity supported, Feet supported Sitting balance-Leahy Scale: Good     Standing balance support: Bilateral upper extremity supported, During functional activity, Reliant on assistive device for balance Standing balance-Leahy Scale: Poor Standing balance comment: Pt dependent on RW                            ADL either performed or assessed with clinical judgement   ADL Overall ADL's : Needs assistance/impaired                     Lower Body Dressing: Minimal assistance;With adaptive equipment;Sit to/from stand;Sitting/lateral leans Lower Body Dressing Details (indicate cue type and reason): introduced AE. pt able to figure 4 to doff socks, but benefits from sock aide for donning Toilet Transfer: Contact guard assist;Comfort height toilet;Rolling walker (2 wheels)           Functional mobility during ADLs: Contact guard assist;Rolling walker (2 wheels)      Extremity/Trunk Assessment Upper Extremity Assessment Upper Extremity Assessment: Generalized weakness;RUE deficits/detail;LUE deficits/detail RUE Deficits / Details: Digit AROM WFLS with grip 3+/5, elbow AROM WFLS, shoulder flexion 0-130 degrees with gross testing and adherence to cervical precautions.  Did not assess shoulder or elbow strength but pt report weakness in the shoulders LUE Deficits / Details: Digit AROM approximately 90% of full for flexion.  Able to oppose thumb to all digits with grip at 3/5.  Shoulder flexion AROM 0-80 degrees with AAROM 0-130, stopping secondary to cervical precautions.  Elbow AROM for flexion and extension WFLs.   Lower Extremity Assessment Lower Extremity Assessment: Defer to PT evaluation        Vision   Vision Assessment?: No apparent visual deficits   Perception     Praxis     Communication Communication Communication: No apparent difficulties   Cognition Arousal: Alert Behavior During Therapy: WFL for tasks assessed/performed Cognition: History of cognitive impairments  OT - Cognition Comments: Pt with what he reports is a history of difficulty remembering things since an accident years ago.                 Following commands: Intact        Cueing   Cueing Techniques: Verbal cues, Gestural cues  Exercises Exercises: Other  exercises Other Exercises Other Exercises: provided soft therapy putty with exercises of composite grasp, lateral pinch and three point pinch.    Shoulder Instructions       General Comments      Pertinent Vitals/ Pain       Pain Assessment Pain Assessment: Faces Faces Pain Scale: Hurts a little bit Pain Location: Neck Pain Descriptors / Indicators: Discomfort, Aching, Sore Pain Intervention(s): Limited activity within patient's tolerance, Monitored during session  Home Living                                          Prior Functioning/Environment              Frequency  Min 2X/week        Progress Toward Goals  OT Goals(current goals can now be found in the care plan section)  Progress towards OT goals: Progressing toward goals  Acute Rehab OT Goals Patient Stated Goal: get better OT Goal Formulation: With patient/family Time For Goal Achievement: 05/04/24 Potential to Achieve Goals: Good ADL Goals Pt Will Perform Lower Body Bathing: with supervision;sit to/from stand;with adaptive equipment Pt Will Perform Lower Body Dressing: with adaptive equipment;sit to/from stand;with min assist Pt Will Transfer to Toilet: with supervision;ambulating;bedside commode Pt Will Perform Toileting - Clothing Manipulation and hygiene: with supervision;sit to/from stand Pt Will Perform Tub/Shower Transfer: Tub transfer;with contact guard assist;rolling walker;shower seat;tub bench;ambulating Pt/caregiver will Perform Home Exercise Program: Increased strength;Right Upper extremity;Left upper extremity;With written HEP provided (FM therapy putty exercises) Additional ADL Goal #1: Pt will transfer supine to sit EOB following cervical precautions with no more than min assist.  Plan      Co-evaluation                 AM-PAC OT 6 Clicks Daily Activity     Outcome Measure   Help from another person eating meals?: A Little Help from another person taking  care of personal grooming?: A Little Help from another person toileting, which includes using toliet, bedpan, or urinal?: A Little Help from another person bathing (including washing, rinsing, drying)?: A Lot Help from another person to put on and taking off regular upper body clothing?: A Little Help from another person to put on and taking off regular lower body clothing?: A Lot 6 Click Score: 16    End of Session Equipment Utilized During Treatment: Gait belt;Rolling walker (2 wheels)  OT Visit Diagnosis: Unsteadiness on feet (R26.81);Other abnormalities of gait and mobility (R26.89);Muscle weakness (generalized) (M62.81)   Activity Tolerance Patient tolerated treatment well   Patient Left in chair;with call bell/phone within reach;with family/visitor present   Nurse Communication Mobility status        Time: 8783-8763 OT Time Calculation (min): 20 min  Charges: OT General Charges $OT Visit: 1 Visit OT Treatments $Self Care/Home Management : 8-22 mins  Elma JONETTA Lebron FREDERICK, OTR/L [facility] Acute Rehabilitation Office: [phone]   Elma JONETTA Lebron 04/22/2024, 4:49 PM

## 2024-04-22 NOTE — Anesthesia Postprocedure Evaluation (Signed)
 Anesthesia Post Note  Patient: David [name]  Procedure(s) Performed: CERVICAL FIVE- SIX-SEVEN LAMINECTOMY FOR EPIDURAL ABSCESS     Patient location during evaluation: PACU Anesthesia Type: General Level of consciousness: awake and alert Pain management: pain level controlled Vital Signs Assessment: post-procedure vital signs reviewed and stable Respiratory status: spontaneous breathing, nonlabored ventilation, respiratory function stable and patient connected to nasal cannula oxygen Cardiovascular status: blood pressure returned to baseline and stable Postop Assessment: no apparent nausea or vomiting Anesthetic complications: no   No notable events documented.  Last Vitals:  Vitals:   04/22/24 1712 04/22/24 2020  BP:  (!) 118/58  Pulse: 62 (!) 58  Resp:    Temp:  36.7 C  SpO2: 97% 97%    Last Pain:  Vitals:   04/22/24 1435  TempSrc: Oral  PainSc:                  Freda Jaquith E

## 2024-04-22 NOTE — Progress Notes (Signed)
 Mobility Specialist Progress Note:    04/22/24 1330  Mobility  Activity Ambulated with assistance  Level of Assistance Contact guard assist, steadying assist  Assistive Device Front wheel walker  Distance Ambulated (ft) 150 ft  Activity Response Tolerated well  Mobility Referral Yes  Mobility visit 1 Mobility  Mobility Specialist Start Time (ACUTE ONLY) 1205  Mobility Specialist Stop Time (ACUTE ONLY) 1217  Mobility Specialist Time Calculation (min) (ACUTE ONLY) 12 min   Pt received in chair agreeable to mobility. No physical assistance required. Was able to practice cervical precautions w/o fault. Returned to room. Left in chair w/ call bell and personal belongings in reach. All needs met. Pt requested MS come back later, will f/u as able.  Thersia Minder Mobility Specialist  Please contact vis Secure Chat or  Rehab Office [phone]

## 2024-04-22 NOTE — Plan of Care (Signed)

## 2024-04-22 NOTE — Progress Notes (Signed)
 Postop day 3.  Patient with minimal neck pain.  No radiating pain.  Still with some paresthesias in both hands but overall strength significantly improved from preop.  Patient is afebrile.  Drain output minimal.  Patient is awake and alert.  He is oriented and appropriate.  Speech is fluent.  Judgment insight are intact.  Cranial nerve function normal bilateral..  Motor examination with 5/5 strength in both deltoids and biceps.  Wrist extensor strength 4+/5.  Tricep strength markedly improved from preop now 4-/5 bilaterally.  Grip strength 4/5 bilaterally.  Intrinsic strength 4/5 bilaterally.  Lower extremity strength 5/5.  Patient voiding well.  Wound clean and dry.  Cultures remain no growth.  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  Patient with subsequent decompressive cervical laminectomy with resection of epidural phlegmon but no frank pus.  Cultures are negative but the patient did receive antibiotics prior to surgery.  Sedimentation rate and C-reactive protein both elevated prior to surgery consistent with but not pathognomonic with osteomyelitis discitis.  Imaging preoperatively not consistent with metastatic process or multiple myeloma by my review given the paravertebral enhancement and enhancement pattern of the epidural mass.  At this point I think the best option is for presumptive therapy for staph epidural abscess/osteomyelitis discitis with 6 weeks of vancomycin  IV.

## 2024-04-22 NOTE — Progress Notes (Signed)
 PHARMACY CONSULT NOTE FOR:  OUTPATIENT  PARENTERAL ANTIBIOTIC THERAPY (OPAT)  Indication: cervical osteomyelitis Regimen: daptomycin 700mg  IV q24, ceftriaxone  2g IV q24h End date: 05/31/24  IV antibiotic discharge orders are pended. To discharging provider:  please sign these orders via discharge navigator,  Select New Orders & click on the button choice - Manage This Unsigned Work.     Thank you for allowing pharmacy to be a part of this patient's care.  Elma Fail, PharmD PGY1 Clinical Pharmacist [facility]  04/22/2024 10:28 AM

## 2024-04-22 NOTE — Progress Notes (Signed)
 [facility]  Date of Admission:  04/18/2024                                      Reason for Consult: Cervical Spine OM                                      Antibiotics: Vancomycin : 9/25 - Present Ceftriaxone  9/25 - Present   Assessment and Plan:  The patient is a 73 year old male who presented with 3 weeks of progressive sudden neck pain, numbness and weakness of his upper extremities. The patient was found to have a C4-T1 vertebral osteomyelitis with spondylopathic changes s/p urgent decompression surgery on 04/19/24. Intraoperative cultures are no growth to date. AFB and fungal cultures were added on and  are pending. The patient is currently on Vancomycin  and Ceftriaxone   Of note, the patient did report 4-5 weeks of weight loss prior to admission. Will need further workup.  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    Coenzyme Q10 100 MG capsule Take 100 mg by mouth daily.    diclofenac (VOLTAREN) 75 MG EC tablet Take 75 mg by mouth daily as needed for mild pain (pain score 1-3).    gabapentin  (NEURONTIN ) 300 MG capsule Take 1 capsule (300 mg total) by mouth 3 (three) times daily. Qty: 30 capsule, Refills: 0    Garlic 100 MG TABS Take 100 mg by mouth daily at 6 (six) AM.    Ginger,  Zingiber officinalis, (GINGER ROOT PO) Take 1 tablet by mouth 2 (two) times daily.    L-Glutamine 500 MG CAPS Take 1 capsule by mouth 2 (two) times daily.    metoprolol  succinate (TOPROL -XL) 100 MG 24 hr tablet TAKE 1 TABLET BY MOUTH ONCE DAILY TAKE  WITH  OR  IMNMEDIATELY  FOLLOWING  A  MEAL Qty: 60 tablet, Refills: 0    Multiple Vitamin (MULTI-VITAMINS) TABS Take 1 tablet by mouth daily.    oxyCODONE  (ROXICODONE ) 5 MG immediate release tablet Take 1 tablet (5 mg total) by mouth every 6 (six) hours as needed for up to 10 doses. Qty: 10 tablet, Refills: 0    pregabalin (LYRICA) 75 MG capsule Take 75 mg by mouth daily as needed (for pain).    tiZANidine (ZANAFLEX) 4 MG capsule Take 4 mg by mouth daily as needed for muscle spasms.  EPINEPHrine 0.3 mg/0.3 mL IJ SOAJ injection Inject into the skin.    methylPREDNISolone  (MEDROL  DOSEPAK) 4 MG TBPK tablet Follow package insert Qty: 21 each, Refills: 0       Subjective: The patient is sitting in his bedside chair in no apparent distress. He states that he feels better today and denies any complaints other than a tingling sensation in his fingertips. No complaints of weakness. Afebrile. Normal WBC count. No acute events overnight   Review of Systems: Constitutional: Negative for fever, chills, fatigue or unexpected weight change. Tingling in his fingertips  HENT: Negative for rhinorrhea and sore throat. Eyes: Negative for visual disturbance. Respiratory: Negative for cough and shortness of breath. Cardiovascular: Negative for chest pain and palpitations. Gastrointestinal: Negative for abdominal pain, nausea, vomiting and diarrhea. Genitourinary: Negative for dysuria and hematuria. Musculoskeletal: Negative for arthralgias and joint swelling. Skin: Negative for rash.   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   Chicken pox    History of skin cancer    Polyp of colon    Social  History   Tobacco Use   Smoking status: Never   Smokeless tobacco: Never  Vaping Use   Vaping status: Never Used  Substance Use Topics   Alcohol use: Yes    Comment: occasionally   Drug use: No   Family History  Problem Relation Age of Onset   Arthritis Mother    Diabetes Mother    Heart attack Father    Heart attack Brother    Stroke Brother    Hypertension Brother    Hypertension Brother    Allergies  Allergen Reactions   Bee Venom Swelling   Honey Bee Venom Swelling   Objective: Vitals:   04/21/24 1900 04/22/24 0346 04/22/24 0729 04/22/24 1435  BP: (!) 132/59 136/61 128/62 (!) 138/57  Pulse: 76 70 70 64  Resp: 16 15 17 17   Temp: 98.3 F (36.8 C) 97.6 F (36.4 C) 97.8 F (36.6 C) 97.9 F (36.6 C)  TempSrc: Oral Oral Oral Oral  SpO2: 95% 96% 95% 95%  Weight:      Height:       Body mass index is 26.72 kg/m.  General: Well developed, well nourished male in no apparent distress HENT: Moist mucous membranes, normal nose, normal external ears, and normocephalic Neck: Supple, trachea midline, and normal cervical range of motion. Cervical drain in place with serosanguineous drainage  Eyes: PERRL, EOMI, non-icteric, and normal conjunctivae and lids Lungs: Clear to auscultation bilaterally. No wheezing, rales or rhonchi Cardiac: Regular rate and rhythm. No murmurs, rubs or gallops. No peripheral edema Abdomen: Soft, Non distended, Non tender, active bowel sounds Skin: Intact, no focal erythema or rash, and warm and dry GU: Deferred genital exam Musculoskeletal: No obvious skeletal abnormalities Neuro: Alert, no focal neurologic deficits, moves all extremities Psych: Oriented x 3, cooperative   Evalene Munch, MD [facility] 04/22/2024, 3:40 PM

## 2024-04-23 DIAGNOSIS — M4622 Osteomyelitis of vertebra, cervical region: Secondary | ICD-10-CM

## 2024-04-23 LAB — BASIC METABOLIC PANEL WITH GFR
Anion gap: 11 (ref 5–15)
BUN: 15 mg/dL (ref 8–23)
CO2: 26 mmol/L (ref 22–32)
Calcium: 9.2 mg/dL (ref 8.9–10.3)
Chloride: 100 mmol/L (ref 98–111)
Creatinine, Ser: 0.62 mg/dL (ref 0.61–1.24)
GFR, Estimated: >60 mL/min (ref >60)
Glucose, Bld: 114 mg/dL — ABNORMAL HIGH (ref 70–99)
Potassium: 3.8 mmol/L (ref 3.5–5.1)
Sodium: 137 mmol/L (ref 135–145)

## 2024-04-23 LAB — GLUCOSE, CAPILLARY
Glucose-Capillary: 113 mg/dL — ABNORMAL HIGH (ref 70–99)
Glucose-Capillary: 94 mg/dL (ref 70–99)
Glucose-Capillary: 126 mg/dL — ABNORMAL HIGH (ref 70–99)
Glucose-Capillary: 111 mg/dL — ABNORMAL HIGH (ref 70–99)

## 2024-04-23 LAB — CK: Total CK: 13 U/L — ABNORMAL LOW (ref 49–397)

## 2024-04-23 MED ORDER — SODIUM CHLORIDE 0.9% FLUSH
10.0000 mL | Freq: Two times a day (BID) | INTRAVENOUS | Status: DC
  Administered 2024-04-23 – 2024-04-26 (×7): 10 mL

## 2024-04-23 MED ORDER — FLEET ENEMA RE ENEM: 1.0000 | ENEMA | Freq: Every day | RECTAL | Status: DC | PRN

## 2024-04-23 MED ORDER — BISACODYL 10 MG RE SUPP
10.0000 mg | Freq: Every day | RECTAL | Status: DC | PRN
  Administered 2024-04-23: 10 mg via RECTAL
  Filled 2024-04-23: qty 1

## 2024-04-23 MED ORDER — POLYETHYLENE GLYCOL 3350 17 G PO PACK
17.0000 g | PACK | Freq: Every day | ORAL | Status: DC
  Administered 2024-04-23 – 2024-04-26 (×4): 17 g via ORAL
  Filled 2024-04-23 (×4): qty 1

## 2024-04-23 MED ORDER — CHLORHEXIDINE GLUCONATE CLOTH 2 % EX PADS
6.0000 | MEDICATED_PAD | Freq: Every day | CUTANEOUS | Status: DC
Start: 2024-04-23 — End: 2024-04-26
  Administered 2024-04-23 – 2024-04-26 (×4): 6 via TOPICAL

## 2024-04-23 MED ORDER — SODIUM CHLORIDE 0.9% FLUSH: 10.0000 mL | INTRAVENOUS | Status: DC | PRN

## 2024-04-23 NOTE — TOC Initial Note (Addendum)
 Transition of Care [facility]) - Initial/Assessment Note    Patient Details  Name: David [name] MRN: [id number] Date of Birth: Oct 12, 1950  [facility] CM/SW Contact:    Rosalva Jon Bloch, RN Phone Number: 04/23/2024, 4:23 PM  Clinical Narrative:                  -  Cervical Spine OM  From home with daughter. PTA independent with ADL's , no DME  usage.  Per ID pt will require 6 weeks of IV antibiotics therapy, stop date 11/7. Pt agreeable to home antibiotic infusion. Wife to assist with care. Referral made with [facility] and accepted for antibiotic therapy. Teaching will be done @ bedside prior to d/c. Referral made with [name] and accepted to provided [facility]. PICC line placed, OPAT in.  IPCM following and will contnue assisting with needs...  Expected Discharge Plan: Home w Home Health Services Barriers to Discharge: Continued Medical Work up   Patient Goals and CMS Choice     Choice offered to / list presented to : Patient      Expected Discharge Plan and Services   Discharge Planning Services: CM Consult   Living arrangements for the past 2 months: Single Family Home                 DME Arranged: Other see comment (IV ABX infusion) DME Agency: Other - Comment ([facility]) Date DME Agency Contacted: 04/22/24 Time DME Agency Contacted: 1109 Representative spoke with at DME Agency: Angie HH Arranged: RN HH Agency: [facility] Date HH Agency Contacted: 04/22/24 Time HH Agency Contacted: 1108  Representative spoken with @  HH agency: [place]  Prior Living Arrangements/Services Living arrangements for the past 2 months: Single Family Home Lives with:: Spouse Patient language and need for interpreter reviewed:: Yes Do you feel safe going back to the place where you live?: Yes      Need for Family Participation in Patient Care: Yes (Comment) Care giver support system in place?: Yes (comment)   Criminal  Activity/Legal Involvement Pertinent to Current Situation/Hospitalization: No - Comment as needed  Activities of Daily Living      Permission Sought/Granted                  Emotional Assessment       Orientation: : Oriented to Self, Oriented to Place, Oriented to  Time, Oriented to Situation Alcohol / Substance Use: Not Applicable Psych Involvement: No (comment)  Admission diagnosis:  Stenosis of cervical spine with myelopathy (HCC) [F51.97, G99.2] Patient Active Problem List   Diagnosis Date Noted   Stenosis of cervical spine with myelopathy (HCC) 04/18/2024   Actinic keratoses 08/09/2017   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 [place] [address] Phone: [phone] Fax: [phone]     Social Drivers of Health (SDOH) Social History: SDOH Screenings   Food Insecurity: No Food Insecurity (04/18/2024)  Housing: Low Risk  (04/18/2024)  Transportation Needs: No Transportation Needs (04/18/2024)  Utilities: Not At Risk (04/18/2024)  Social Connections: Moderately Integrated (04/19/2024)  Tobacco Use: Low Risk  (04/19/2024)   SDOH Interventions:     Readmission Risk Interventions     No data to display

## 2024-04-23 NOTE — Progress Notes (Signed)
 Physical Therapy Treatment Patient Details Name: David [name] MRN: [id number] DOB: 10-06-1950 Today's Date: 04/23/2024   History of Present Illness David [name] is a 73 y.o. 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 Pt s/p C5-C6-C7 decompressive laminectomy with evacuation of cervical epidural abscess 9/26. PMHx: HTN, GERD, colon polyp, and skin cancer.    PT Comments  Pt resting in bed on arrival, pleasant and eager for mobility. Pt demonstrating continued progress towards acute goals, progressing activity tolerance and demonstrating good adherence to all precautions throughout mobility. Pt demonstrating bed mobility with up to min A to manage trunk and LES, and transfers and gait with RW for support with grossly supervision. Continued education on precautions, appropriate activity progression, continued RW use, and car transfer, with pt verbalizing understanding. Pt continues to benefit from skilled PT services to progress toward functional mobility goals.      If plan is discharge home, recommend the following: A little help with walking and/or transfers;A little help with bathing/dressing/bathroom;Assistance with cooking/housework;Assist for transportation;Help with stairs or ramp for entrance   Can travel by private vehicle        Equipment Recommendations  Rolling walker (2 wheels)    Recommendations for Other Services       Precautions / Restrictions Precautions Precautions: Cervical Precaution Booklet Issued: Yes (comment) Recall of Precautions/Restrictions: Intact Precaution/Restrictions Comments: No brace needed Restrictions Weight Bearing Restrictions Per Provider Order: No     Mobility  Bed Mobility Overal bed mobility: Needs Assistance Bed Mobility: Rolling, Sidelying to Sit, Sit to Sidelying Rolling: Supervision Sidelying to sit: Min assist, HOB elevated     Sit to sidelying: Min assist, HOB elevated General bed mobility  comments: pt rolling to L EOB, min A to elevate trunk to sitting and min A to manage LEs into bed at end of session, good recall for log roll technique    Transfers Overall transfer level: Needs assistance Equipment used: Rolling walker (2 wheels) Transfers: Sit to/from Stand Sit to Stand: Supervision           General transfer comment: supervision for safety    Ambulation/Gait Ambulation/Gait assistance: Supervision Gait Distance (Feet): 400 Feet Assistive device: Rolling walker (2 wheels) Gait Pattern/deviations: Step-through pattern, Decreased stride length Gait velocity: decr     General Gait Details: Pt ambualted with a reciprocal gait pattern, even weight shift, and good foot clearence. He maintained netural head position, upright posture, and good proximity to RW. No LOB.   Stairs             Wheelchair Mobility     Tilt Bed    Modified Rankin (Stroke Patients Only)       Balance Overall balance assessment: Needs assistance Sitting-balance support: Bilateral upper extremity supported, Feet supported Sitting balance-Leahy Scale: Good     Standing balance support: Bilateral upper extremity supported, During functional activity, Reliant on assistive device for balance Standing balance-Leahy Scale: Poor Standing balance comment: Pt dependent on RW                            Communication Communication Communication: No apparent difficulties  Cognition Arousal: Alert Behavior During Therapy: WFL for tasks assessed/performed                             Following commands: Intact      Cueing Cueing Techniques: Verbal cues, Gestural cues  Exercises  General Comments General comments (skin integrity, edema, etc.): VSS on RA, pt daughter present and supportive      Pertinent Vitals/Pain Pain Assessment Pain Assessment: Faces Faces Pain Scale: Hurts a little bit Pain Location: Neck Pain Descriptors / Indicators:  Discomfort, Aching, Sore Pain Intervention(s): Monitored during session, Limited activity within patient's tolerance, Patient requesting pain meds-RN notified    Home Living                          Prior Function            PT Goals (current goals can now be found in the care plan section) Acute Rehab PT Goals PT Goal Formulation: With patient/family Time For Goal Achievement: 05/04/24 Progress towards PT goals: Progressing toward goals    Frequency    Min 2X/week      PT Plan      Co-evaluation              AM-PAC PT 6 Clicks Mobility   Outcome Measure  Help needed turning from your back to your side while in a flat bed without using bedrails?: A Little Help needed moving from lying on your back to sitting on the side of a flat bed without using bedrails?: A Little Help needed moving to and from a bed to a chair (including a wheelchair)?: A Little Help needed standing up from a chair using your arms (e.g., wheelchair or bedside chair)?: A Little Help needed to walk in hospital room?: A Little Help needed climbing 3-5 steps with a railing? : A Little 6 Click Score: 18    End of Session   Activity Tolerance: Patient tolerated treatment well Patient left: in bed;with call bell/phone within reach;with family/visitor present Nurse Communication: Mobility status PT Visit Diagnosis: Other abnormalities of gait and mobility (R26.89);Unsteadiness on feet (R26.81)     Time: 9083-9069 PT Time Calculation (min) (ACUTE ONLY): 14 min  Charges:    $Therapeutic Activity: 8-22 mins PT General Charges $$ ACUTE PT VISIT: 1 Visit                     David [name] R. PTA Acute Rehabilitation Services Office: [phone]   David [name] 04/23/2024, 11:14 AM

## 2024-04-23 NOTE — Progress Notes (Signed)
   Providing Compassionate, Quality Care - Together   Subjective: Patient reports the numbness in his hands has resolved. He is concerned he hasn't had a bowel movement yet. PICC line placed this morning.  Objective: Vital signs in last 24 hours: Temp:  [97.8 F (36.6 C)-98.1 F (36.7 C)] 97.8 F (36.6 C) (09/30 0722) Pulse Rate:  [52-64] 52 (09/30 0722) Resp:  [17-18] 17 (09/30 0722) BP: (118-138)/(56-59) 127/59 (09/30 0722) SpO2:  [95 %-98 %] 98 % (09/30 0722)  Intake/Output from previous day: 09/29 0701 - 09/30 0700 In: 720 [P.O.:720] Out: 200 [Drains:200] Intake/Output this shift: No intake/output data recorded.  Alert and oriented x 4 PERRLA CN II-XII grossly intact MAE, Strength and sensation intact Incision is covered with gauze dressing and Steri Strips; Dressing is clean, dry, and intact   Lab Results: No results for input(s): WBC, HGB, HCT, PLT in the last 72 hours. BMET Recent Labs    04/22/24 0407 04/23/24 0459  NA 137 137  K 4.0 3.8  CL 104 100  CO2 25 26  GLUCOSE 154* 114*  BUN 15 15  CREATININE 0.80 0.62  CALCIUM 9.1 9.2    Studies/Results: US  EKG SITE RITE Result Date: 04/22/2024 If Site Rite image not attached, placement could not be confirmed due to current cardiac rhythm.   Assessment/Plan: Patient underwent C5-C6-C7 decompressive laminectomy with evacuation of cervical epidural abscess. Wound culture with no growth. Patient will discharge on IV daptomycin and ceftriaxone .   LOS: 5 days   -Added Miralax, Dulcolax suppository, and Fleet enema. Patient would like to have a BM prior to discharging. -Patient is ready for discharge.  I am in communication with my attending and they agree with the plan for this patient.   Gerard Beck, DNP, AGNP-C Nurse Practitioner  [facility] [address], [place], [place] [address] P: [phone]    F: [phone]  04/23/2024, 10:47  AM

## 2024-04-23 NOTE — Progress Notes (Signed)
 Peripherally Inserted Central Catheter Placement  The IV Nurse has discussed with the patient and/or persons authorized to consent for the patient, the purpose of this procedure and the potential benefits and risks involved with this procedure.  The benefits include less needle sticks, lab draws from the catheter, and the patient may be discharged home with the catheter. Risks include, but not limited to, infection, bleeding, blood clot (thrombus formation), and puncture of an artery; nerve damage and irregular heartbeat and possibility to perform a PICC exchange if needed/ordered by physician.  Alternatives to this procedure were also discussed.  Bard Power PICC patient education guide, fact sheet on infection prevention and patient information card has been provided to patient /or left at bedside.    PICC Placement Documentation  PICC Single Lumen 04/23/24 Right Brachial 38 cm 0 cm (Active)  Indication for Insertion or Continuance of Line Home intravenous therapies (PICC only) 04/22/24 2100  Exposed Catheter (cm) 0 cm 04/22/24 2100  Site Assessment Clean, Dry, Intact 04/22/24 2100  Line Status Flushed;Saline locked;Blood return noted 04/22/24 2100  Dressing Type Transparent;Securing device 04/22/24 2100  Dressing Status Antimicrobial disc/dressing in place;Clean, Dry, Intact 04/22/24 2100  Line Care Connections checked and tightened 04/22/24 2100  Line Adjustment (NICU/IV Team Only) No 04/22/24 2100  Dressing Intervention New dressing;Adhesive placed at insertion site (IV team only) 04/22/24 2100  Dressing Change Due 04/30/24 04/22/24 2100       Ethyl Priestly Renee 04/23/2024, 9:15 AM

## 2024-04-23 NOTE — Addendum Note (Signed)
 Addendum  created 04/23/24 1635 by Waddell Lauraine NOVAK, MD   Attestation recorded in Intraprocedure, Intraprocedure Attestations filed

## 2024-04-23 NOTE — Plan of Care (Signed)

## 2024-04-23 NOTE — Plan of Care (Signed)

## 2024-04-23 NOTE — Progress Notes (Signed)
 [facility]  Date of Admission:  04/18/2024                                      Reason for Consult: Cervical Spine OM                                      Antibiotics: Vancomycin : 9/25 - 9/29 Ceftriaxone  9/25 - Present  Daptomycin 9/29 - Present   Assessment and Plan:  The patient is a 73 year old male who presented with 3 weeks of progressive sudden neck pain, numbness and weakness of his upper extremities. The patient was found to have a C4-T1 vertebral osteomyelitis with spondylopathic changes s/p urgent decompression surgery on 04/19/24. Intraoperative cultures are no growth to date. AFB and fungal cultures were added on and are also negative The patient is currently on Daptomycin and Ceftriaxone   Of note, the patient did report 4-5 weeks of weight loss prior to admission. Will need further workup.  Cervical Spine Osteomyelitis  - The patient will need 6 weeks of IV antibiotics from the date of his surgery, April 19, 2024 - He is currently on Daptomycin 8mg /kg IV daily and Ceftriaxone  2g IV daily - The patient is to continue on both antibiotics for the 6 week course - Single lumen PICC line placed  - Stop date for his antibiotics is May 31, 2024 - Obtain weekly CBC w/ diff, CMP, ESR, CRP and CK   OPAT ORDERS:  Diagnosis: Cervical Spine (C4-T1) Osteomyelitis   Culture Result: Negative aerobic, anaerobic, AFB and Fungal   Allergies  Allergen Reactions   Bee Venom Swelling   Honey Bee Venom Swelling   Discharge antibiotics to be given via PICC line: Daptomycin 8mg /kg IV daily and Ceftriaxone  2g IV daily  Duration: 6 Weeks   End Date: May 31, 2024  [facility] Care Per Protocol with Bi[employer] Use: Home health RN for IV administration and teaching, line care and labs.    Labs weekly while on IV antibiotics: _x_ CBC with differential __ BMP **TWICE WEEKLY ON VANCOMYCIN   _x_ CMP _x_ CRP _x_ ESR __ Vancomycin  trough TWICE WEEKLY _x_  CK  _x_ Please pull PIC at completion of IV antibiotics __ Please leave PIC in place until doctor has seen patient or been notified  Fax weekly labs to [phone]   Clinic Follow Up Appt: May 10, 2024 at 0830 with Corean Fireman, NP May 30, 2024 at 1600 with Dr. 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    Coenzyme Q10 100 MG capsule Take 100 mg by mouth daily.    diclofenac (VOLTAREN) 75 MG EC tablet Take 75 mg by mouth daily as needed for mild pain (pain score 1-3).    gabapentin  (NEURONTIN ) 300 MG capsule Take 1 capsule (300 mg total) by mouth 3 (three) times daily. Qty: 30 capsule,  Refills: 0    Garlic 100 MG TABS Take 100 mg by mouth daily at 6 (six) AM.    Ginger, Zingiber officinalis, (GINGER ROOT PO) Take 1 tablet by mouth 2 (two) times daily.    L-Glutamine 500 MG CAPS Take 1 capsule by mouth 2 (two) times daily.    metoprolol  succinate (TOPROL -XL) 100 MG 24 hr tablet TAKE 1 TABLET BY MOUTH ONCE DAILY TAKE  WITH  OR  IMNMEDIATELY  FOLLOWING  A  MEAL Qty: 60 tablet, Refills: 0    Multiple Vitamin (MULTI-VITAMINS) TABS Take 1 tablet by mouth daily.    oxyCODONE  (ROXICODONE ) 5 MG immediate release tablet Take 1 tablet (5 mg total) by mouth every 6 (six) hours as needed for up to 10 doses. Qty: 10 tablet, Refills: 0    pregabalin (LYRICA) 75 MG capsule Take 75 mg by mouth daily as needed (for pain).    tiZANidine (ZANAFLEX) 4 MG capsule Take 4 mg by mouth daily as needed for muscle spasms.    EPINEPHrine 0.3 mg/0.3 mL IJ SOAJ injection Inject into the skin.     methylPREDNISolone  (MEDROL  DOSEPAK) 4 MG TBPK tablet Follow package insert Qty: 21 each, Refills: 0       Subjective: The patient is sitting in his bedside chair in no apparent distress. He states that he feels well today and that when he woke up the tingling in his hands was gone. Cervical drain has been removed. PICC line was placed. Patient complaining of constipation. No acute events overnight   Review of Systems: Constitutional: Negative for fever, chills, fatigue or unexpected weight change HENT: Negative for rhinorrhea and sore throat. Eyes: Negative for visual disturbance. Respiratory: Negative for cough and shortness of breath. Cardiovascular: Negative for chest pain and palpitations. Gastrointestinal: Negative for abdominal pain, nausea, vomiting and diarrhea. Genitourinary: Negative for dysuria and hematuria. Musculoskeletal: Negative for arthralgias and joint swelling. Skin: Negative for rash.  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   Chicken pox    History of skin cancer    Polyp of colon    Social History   Tobacco Use   Smoking status: Never   Smokeless tobacco: Never  Vaping Use   Vaping status: Never Used  Substance Use Topics   Alcohol use: Yes    Comment: occasionally   Drug use: No   Family History  Problem Relation Age of Onset   Arthritis Mother    Diabetes Mother    Heart attack Father    Heart attack Brother    Stroke Brother    Hypertension Brother    Hypertension Brother    Allergies  Allergen Reactions   Bee Venom Swelling   Honey Bee Venom Swelling   Objective: Vitals:   04/22/24 2020 04/23/24 0529 04/23/24 0722 04/23/24 1349  BP: (!) 118/58 (!) 121/56 (!) 127/59 (!) 127/54  Pulse: (!) 58 (!) 53 (!) 52 (!) 54  Resp:  18 17   Temp: 98.1 F (36.7 C) 98.1 F (36.7 C) 97.8 F (36.6 C) 97.6 F (36.4 C)  TempSrc:  Oral Oral Oral  SpO2: 97% 96% 98% 99%  Weight:      Height:        Body mass index is 26.72 kg/m.  General: Well developed, well nourished male in no apparent distress HENT: Moist mucous membranes, normal nose, normal external ears, and normocephalic Neck: Supple, trachea midline, and normal cervical range of motion Eyes:  PERRL, EOMI, non-icteric, and normal conjunctivae and lids Lungs: Clear to auscultation bilaterally. No wheezing, rales or rhonchi Cardiac: Regular rate and rhythm. No murmurs, rubs or gallops. No peripheral edema Abdomen: Soft, Non distended, Non tender, active bowel sounds Skin: Intact, no focal erythema or rash, and warm and dry GU: Deferred genital exam Musculoskeletal: No obvious skeletal abnormalities Neuro: Alert, no focal neurologic deficits, moves all extremities Psych: Oriented x 3, cooperative  Evalene CHRISTELLA Munch, MD [facility] 04/23/2024, 2:54 PM

## 2024-04-24 ENCOUNTER — Inpatient Hospital Stay (HOSPITAL_COMMUNITY)

## 2024-04-24 LAB — ECHOCARDIOGRAM COMPLETE
AR max vel: 2.69 cm2
AV Area VTI: 3.25 cm2
AV Area mean vel: 2.64 cm2
AV Mean grad: 3 mmHg
AV Peak grad: 6.4 mmHg
Ao pk vel: 1.26 m/s
Area-P 1/2: 8.43 cm2
Calc EF: 62 %
Height: 72 in
MV VTI: 2.9 cm2
S' Lateral: 3.2 cm
Single Plane A2C EF: 61 %
Single Plane A4C EF: 61.1 %
Weight: 3152 [oz_av]

## 2024-04-24 LAB — TROPONIN I (HIGH SENSITIVITY): Troponin I (High Sensitivity): 14 ng/L (ref <18)

## 2024-04-24 LAB — GLUCOSE, CAPILLARY
Glucose-Capillary: 86 mg/dL (ref 70–99)
Glucose-Capillary: 84 mg/dL (ref 70–99)
Glucose-Capillary: 126 mg/dL — ABNORMAL HIGH (ref 70–99)
Glucose-Capillary: 106 mg/dL — ABNORMAL HIGH (ref 70–99)

## 2024-04-24 LAB — BASIC METABOLIC PANEL WITH GFR
Anion gap: 13 (ref 5–15)
BUN: 19 mg/dL (ref 8–23)
CO2: 25 mmol/L (ref 22–32)
Calcium: 9.1 mg/dL (ref 8.9–10.3)
Chloride: 98 mmol/L (ref 98–111)
Creatinine, Ser: 0.74 mg/dL (ref 0.61–1.24)
GFR, Estimated: >60 mL/min (ref >60)
Glucose, Bld: 100 mg/dL — ABNORMAL HIGH (ref 70–99)
Potassium: 3.8 mmol/L (ref 3.5–5.1)
Sodium: 136 mmol/L (ref 135–145)

## 2024-04-24 LAB — CK TOTAL AND CKMB (NOT AT ARMC)
CK, MB: 0.8 ng/mL (ref 0.5–5.0)
Total CK: 14 U/L — ABNORMAL LOW (ref 49–397)

## 2024-04-24 MED ORDER — CEFTRIAXONE IV (FOR PTA / DISCHARGE USE ONLY): 2.0000 g | INTRAVENOUS | 0 refills | Status: DC

## 2024-04-24 MED ORDER — DAPTOMYCIN IV (FOR PTA / DISCHARGE USE ONLY): 700.0000 mg | INTRAVENOUS | 0 refills | Status: DC

## 2024-04-24 MED ORDER — METOPROLOL TARTRATE 5 MG/5ML IV SOLN: 5.0000 mg | INTRAVENOUS | Status: DC | PRN

## 2024-04-24 NOTE — Consult Note (Signed)
 Reason for Consult: Atrial fibrillation with rapid ventricular response Referring Physician: Dr. Victory Gunnels  David [name] is an 73 y.o. male.  HPI: Patient is a 73 year old male with past medical history significant for hypertension, history of questionable paroxysmal atrial fibrillation not on any anticoagulants states took anticoagulation 20+ years ago for approximately 6 months and then was stopped, history of mitral valve prolapse complains of palpitation off and on for last approximately 1 year had event monitor for 14 days in December/January of this year which showed paroxysmal nonsustained episodes of SVT treated with metoprolol  but no documented episodes of A-fib underwent spinal decompression surgery on Friday with posterior approach for spinal stenosis and prior to discharge today developed sudden onset of palpitations associated with localized vague precordial chest pain and left shoulder pain.  States chest pain and shoulder pain has resolved but still feels palpitations EKG done showed A-fib with RVR which is new.  Patient denies any history of rheumatic fever but states he has mitral valve prolapse.  Patient states he is very active denies any anginal chest pain states had multiple nuclear stress test in the past which have been normal.  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   Chicken pox    History of skin cancer    Polyp of colon     Past Surgical History:  Procedure Laterality Date   APPENDECTOMY     CIRCUMCISION     at age 16 yrs old   COLONOSCOPY     FORAMINOTOMY, SPINE, CERVICAL, 1 LEVEL N/A 04/19/2024   Procedure: CERVICAL FIVE- SIX-SEVEN LAMINECTOMY FOR EPIDURAL ABSCESS;  Surgeon: Gunnels Victory, MD;  Location: [facility] OR;  Service: Neurosurgery;  Laterality: N/A;  CERVICAL FIVE- SIX-SEVEN LAMINECTOMY  FOR EPIDURAL ABSCESS   penile biopsy  03/01/2022    Family History  Problem Relation Age of Onset   Arthritis Mother     Diabetes Mother    Heart attack Father    Heart attack Brother    Stroke Brother    Hypertension Brother    Hypertension Brother     Social History:  reports that he has never smoked. He has never used smokeless tobacco. He reports current alcohol use. He reports that he does not use drugs.  Allergies:  Allergies  Allergen Reactions   Bee Venom Swelling   Honey Bee Venom Swelling    Medications: I have reviewed the patient's current medications.  Results for orders placed or performed during the hospital encounter of 04/18/24 (from the past 48 hours)  Glucose, capillary     Status: Abnormal   Collection Time: 04/22/24  4:37 PM  Result Value Ref Range   Glucose-Capillary 132 (H) 70 - 99 mg/dL    Comment: Glucose reference range applies only to samples taken after fasting for at least 8 hours.  Glucose, capillary     Status: Abnormal   Collection Time: 04/22/24  8:42 PM  Result Value Ref Range   Glucose-Capillary 157 (H) 70 - 99 mg/dL    Comment: Glucose reference range applies only to samples taken after fasting for at least 8 hours.  Basic metabolic panel     Status: Abnormal   Collection Time: 04/23/24  4:59 AM  Result Value Ref Range   Sodium 137 135 - 145 mmol/L   Potassium 3.8 3.5 - 5.1 mmol/L   Chloride 100 98 - 111 mmol/L   CO2 26 22 - 32 mmol/L   Glucose,  Bld 114 (H) 70 - 99 mg/dL    Comment: Glucose reference range applies only to samples taken after fasting for at least 8 hours.   BUN 15 8 - 23 mg/dL   Creatinine, Ser 9.37 0.61 - 1.24 mg/dL   Calcium 9.2 8.9 - 89.6 mg/dL   GFR, Estimated >39 >39 mL/min    Comment: (NOTE) Calculated using the CKD-EPI Creatinine Equation (2021)    Anion gap 11 5 - 15    Comment: Performed at [facility], [address] [place], [place] [address]  CK     Status: Abnormal   Collection Time: 04/23/24  4:59 AM  Result Value Ref Range   Total CK 13 (L) 49 - 397 U/L    Comment: Performed at [facility], [address] [place], [place] [address]  Glucose, capillary     Status: Abnormal   Collection Time: 04/23/24  6:44 AM  Result Value Ref Range   Glucose-Capillary 113 (H) 70 - 99 mg/dL    Comment: Glucose reference range applies only to samples taken after fasting for at least 8 hours.  Glucose, capillary     Status: None   Collection Time: 04/23/24 11:17 AM  Result Value Ref Range   Glucose-Capillary 94 70 - 99 mg/dL    Comment: Glucose reference range applies only to samples taken after fasting for at least 8 hours.  Glucose, capillary     Status: Abnormal   Collection Time: 04/23/24  4:13 PM  Result Value Ref Range   Glucose-Capillary 126 (H) 70 - 99 mg/dL    Comment: Glucose reference range applies only to samples taken after fasting for at least 8 hours.  Glucose, capillary     Status: Abnormal   Collection Time: 04/23/24  9:01 PM  Result Value Ref Range   Glucose-Capillary 111 (H) 70 - 99 mg/dL    Comment: Glucose reference range applies only to samples taken after fasting for at least 8 hours.  Basic metabolic panel     Status: Abnormal   Collection Time: 04/24/24  3:53 AM  Result Value Ref Range   Sodium 136 135 - 145 mmol/L   Potassium 3.8 3.5 - 5.1 mmol/L   Chloride 98 98 - 111 mmol/L   CO2 25 22 - 32 mmol/L   Glucose, Bld 100 (H) 70 - 99 mg/dL    Comment: Glucose reference range applies only to samples taken after fasting for at least 8 hours.   BUN 19 8 - 23 mg/dL   Creatinine, Ser 9.25 0.61 - 1.24 mg/dL   Calcium 9.1 8.9 - 89.6 mg/dL   GFR, Estimated >39 >39 mL/min    Comment: (NOTE) Calculated using the CKD-EPI Creatinine Equation (2021)    Anion gap 13 5 - 15    Comment: Performed at [facility], [address] [place], [place] [address]  Glucose, capillary     Status: None   Collection Time: 04/24/24  6:25 AM  Result Value Ref Range   Glucose-Capillary 86 70 - 99 mg/dL    Comment: Glucose reference range applies only to samples taken after fasting for at least  8 hours.  Glucose, capillary     Status: None   Collection Time: 04/24/24 12:06 PM  Result Value Ref Range   Glucose-Capillary 84 70 - 99 mg/dL    Comment: Glucose reference range applies only to samples taken after fasting for at least 8 hours.    No results  found.  Review of Systems  Constitutional:  Negative for fatigue.  HENT:  Negative for sore throat.   Respiratory:  Negative for cough and shortness of breath.   Cardiovascular:  Positive for chest pain and palpitations. Negative for leg swelling.  Gastrointestinal:  Negative for abdominal distention.  Genitourinary:  Negative for difficulty urinating.  Neurological:  Negative for dizziness.   Blood pressure (!) 153/68, pulse (!) 58, temperature 98.5 F (36.9 C), temperature source Oral, resp. rate 16, height 6' (1.829 m), weight 89.4 kg, SpO2 100%. Physical Exam Constitutional:      Appearance: Normal appearance.  HENT:     Head: Normocephalic and atraumatic.  Eyes:     Extraocular Movements: Extraocular movements intact.     Conjunctiva/sclera: Conjunctivae normal.  Neck:     Comments: Surgical dressing on posterior aspect of neck noted Cardiovascular:     Rate and Rhythm: Tachycardia present. Rhythm irregular.     Heart sounds: No murmur heard.    No friction rub. No gallop.  Pulmonary:     Effort: Pulmonary effort is normal.     Breath sounds: Normal breath sounds.  Abdominal:     General: Abdomen is flat. There is no distension.     Palpations: Abdomen is soft.  Musculoskeletal:     Comments: No clubbing cyanosis or edema noted  Skin:    General: Skin is warm and dry.  Neurological:     General: No focal deficit present.     Mental Status: He is alert.     Assessment/Plan: A-fib with RVR new onset History of documented paroxysmal SVT Hypertension Cervical spinal stenosis with epidural abscess status post decompressive laminectomy postop day 5 Plan Patient not a candidate for anticoagulant at present  due to spinal surgery Will start amiodarone as per orders hopefully will get converted back into sinus rhythm in the next 24 to 48 hours if remains in A-fib will DC amiodarone and start metoprolol  for rate control only for now and then restart chronic anticoagulation after 2 weeks once cleared by neurosurgery.  Patient will require chronic life long anticoagulation. Check 2D echo check TSH Check serial enzymes and EKG Continue telemetry monitoring for next 24 to 48 hours  David [name] 04/24/2024, 2:28 PM

## 2024-04-24 NOTE — Progress Notes (Signed)
 Dressing changed on upper neck.  Steri strips in tact, small oozing with sweet foul oder from incision site.  Cleansed with NS and applied new surgical dressing.

## 2024-04-24 NOTE — Progress Notes (Signed)
 Occupational Therapy Treatment Patient Details Name: David [name] MRN: [id number] DOB: 13-Jul-1951 Today's Date: 04/24/2024   History of present illness Miran Kautzman is a 73 y.o. 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 Pt s/p C5-C6-C7 decompressive laminectomy with evacuation of cervical epidural abscess 9/26. PMHx: HTN, GERD, colon polyp, and skin cancer.   OT comments  Pt with good progression toward established OT goals. Pt needing up to supervision for mobility this session. Pt needing up to min A for LB ADL. Pt with good tolerance of longer distance mobility and recall of precautions during grooming tasks. Reviewed compensatory techniques for toileting and reviewed therapy putty HEP. Will continue to follow.       If plan is discharge home, recommend the following:  A little help with walking and/or transfers;A lot of help with bathing/dressing/bathroom;Help with stairs or ramp for entrance;Assist for transportation   Equipment Recommendations  BSC/3in1    Recommendations for Other Services      Precautions / Restrictions Precautions Precautions: Cervical Precaution Booklet Issued: Yes (comment) Recall of Precautions/Restrictions: Intact Precaution/Restrictions Comments: No brace needed Restrictions Weight Bearing Restrictions Per Provider Order: No       Mobility Bed Mobility               General bed mobility comments: OOB in chair    Transfers Overall transfer level: Needs assistance Equipment used: Rolling walker (2 wheels) Transfers: Sit to/from Stand Sit to Stand: Supervision           General transfer comment: supervision for safety     Balance Overall balance assessment: Needs assistance Sitting-balance support: Bilateral upper extremity supported, Feet supported Sitting balance-Leahy Scale: Good     Standing balance support: Bilateral upper extremity supported, During functional activity, Reliant on  assistive device for balance Standing balance-Leahy Scale: Poor Standing balance comment: Pt dependent on RW                           ADL either performed or assessed with clinical judgement   ADL Overall ADL's : Needs assistance/impaired Eating/Feeding: Supervision/ safety;Sitting Eating/Feeding Details (indicate cue type and reason): on arrival Grooming: Oral care;Supervision/safety;Standing               Lower Body Dressing: Minimal assistance;With adaptive equipment;Sit to/from stand;Sitting/lateral leans   Toilet Transfer: Comfort height toilet;Rolling walker (2 wheels);Supervision/safety           Functional mobility during ADLs: Supervision/safety;Rolling walker (2 wheels)      Extremity/Trunk Assessment Upper Extremity Assessment Upper Extremity Assessment: Generalized weakness RUE Deficits / Details: Digit AROM WFLS with grip 3+/5, elbow AROM WFLS, shoulder flexion 0-130 degrees with gross testing and adherence to cervical precautions.  Did not assess shoulder or elbow strength but pt report weakness in the shoulders LUE Deficits / Details: Digit AROM approximately 90% of full for flexion.  Able to oppose thumb to all digits with grip at 3/5.  Shoulder flexion AROM 0-80 degrees with AAROM 0-130, stopping secondary to cervical precautions.  Elbow AROM for flexion and extension WFLs.   Lower Extremity Assessment Lower Extremity Assessment: Defer to PT evaluation        Vision   Vision Assessment?: No apparent visual deficits   Perception Perception Perception: Within Functional Limits   Praxis Praxis Praxis: WFL   Communication Communication Communication: No apparent difficulties   Cognition Arousal: Alert Behavior During Therapy: WFL for tasks assessed/performed Cognition: History of cognitive impairments  OT - Cognition Comments: Pt with what he reports is a history of difficulty remembering things since an accident years  ago. although pt able to recall precautions on arrival and compensatory techniques for BADL                 Following commands: Intact        Cueing   Cueing Techniques: Verbal cues, Gestural cues  Exercises Exercises: Other exercises Other Exercises Other Exercises: reviewed soft therapy putty exercises of composite grasp, lateral pinch and three point pinch.    Shoulder Instructions       General Comments VSS on RA    Pertinent Vitals/ Pain       Pain Assessment Pain Assessment: Faces Faces Pain Scale: Hurts a little bit Pain Location: Neck Pain Descriptors / Indicators: Discomfort, Aching, Sore Pain Intervention(s): Limited activity within patient's tolerance  Home Living Family/patient expects to be discharged to:: Private residence Living Arrangements: Spouse/significant other;Children                                      Prior Functioning/Environment              Frequency  Min 2X/week        Progress Toward Goals  OT Goals(current goals can now be found in the care plan section)  Progress towards OT goals: Progressing toward goals  Acute Rehab OT Goals Patient Stated Goal: get better OT Goal Formulation: With patient/family Time For Goal Achievement: 05/04/24 Potential to Achieve Goals: Good ADL Goals Pt Will Perform Lower Body Bathing: with supervision;sit to/from stand;with adaptive equipment Pt Will Perform Lower Body Dressing: with adaptive equipment;sit to/from stand;with min assist Pt Will Transfer to Toilet: with supervision;ambulating;bedside commode Pt Will Perform Toileting - Clothing Manipulation and hygiene: with supervision;sit to/from stand Pt Will Perform Tub/Shower Transfer: Tub transfer;with contact guard assist;rolling walker;shower seat;tub bench;ambulating Pt/caregiver will Perform Home Exercise Program: Increased strength;Right Upper extremity;Left upper extremity;With written HEP provided Additional ADL  Goal #1: Pt will transfer supine to sit EOB following cervical precautions with no more than min assist.  Plan      Co-evaluation                 AM-PAC OT 6 Clicks Daily Activity     Outcome Measure   Help from another person eating meals?: A Little Help from another person taking care of personal grooming?: A Little Help from another person toileting, which includes using toliet, bedpan, or urinal?: A Little Help from another person bathing (including washing, rinsing, drying)?: A Lot Help from another person to put on and taking off regular upper body clothing?: A Little Help from another person to put on and taking off regular lower body clothing?: A Lot 6 Click Score: 16    End of Session Equipment Utilized During Treatment: Gait belt;Rolling walker (2 wheels)  OT Visit Diagnosis: Unsteadiness on feet (R26.81);Other abnormalities of gait and mobility (R26.89);Muscle weakness (generalized) (M62.81)   Activity Tolerance Patient tolerated treatment well   Patient Left in chair;with call bell/phone within reach;with family/visitor present   Nurse Communication Mobility status        Time: 9147-9086 OT Time Calculation (min): 21 min  Charges: OT General Charges $OT Visit: 1 Visit OT Treatments $Self Care/Home Management : 8-22 mins  Elma JONETTA Lebron FREDERICK, OTR/L [facility] Acute Rehabilitation Office: [phone]   Elma JONETTA Lebron 04/24/2024, 10:19  AM

## 2024-04-24 NOTE — Progress Notes (Signed)
 Pt called out c/o of chest pain described as dull pain.  VS taken and noticed HR was jumping from 70-140s.  Notified Md.  Placed telemetry on patient. EGK 12 lead ordered.  NP, Meghan at [facility] to place orders and see pt. Pt sitting in chair with family at [facility].  Pt states he feels ok now.  HR still wandering from 90s-140s.  Will continue to monitor.

## 2024-04-24 NOTE — Discharge Summary (Addendum)
 Physician Discharge Summary  Patient ID: David [name] MRN: [id number] DOB/AGE: 1950-08-06 73 y.o.  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  Patient underwent posterior cervical decompressive surgery with evacuation of epidural abscess.  Cultures were negative for definite organism.  Patient is presumptively treated for staph Everest mellitus.  He is clinically improved postoperatively.  He will continue with IV vancomycin  and oral daptomycin at discharge.  Currently he is doing well.  He has minimal neck pain.  His upper extremity symptoms are much improved.  He is standing ambulating and voiding without difficulty.  Consults:   Significant Diagnostic Studies:   Treatments:   Discharge Exam: Blood pressure 139/70, pulse 93, temperature 97.9 F (36.6 C), temperature source Oral, resp. rate 16, height 6' (1.829 m), weight 89.4 kg, SpO2 99%. Awake and alert.  Oriented and appropriate.  Motor examination with intact deltoid and bicep strength bilaterally.  Trace weakness in his wrist extensors bilaterally but much improved from preop.  Triceps 4-/5 bilaterally which is markedly improved from preop.  Grip 4/5 and intrinsic 4+/5 again markedly improved from preop.  Lower extremity strength 5/5.  Wound healing well.  Chest and abdomen benign.  Disposition: Discharge disposition: 01-Home or Self Care       Discharge Instructions     [facility] pharmacist to adjust dose for Vancomycin , Aminoglycosides and other anti-infective therapies as requested by physician.   Complete by: As directed    [facility] to provide Cath Flo 2mg    Complete by: As  directed    Administer for PICC line occlusion and as ordered by physician for other access device issues.   Anaphylaxis Kit: Provided to treat any anaphylactic reaction to the medication being provided to the patient if First Dose or when requested by physician   Complete by: As directed    Epinephrine 1mg /ml vial / amp: Administer 0.3mg  (0.68ml) subcutaneously once for moderate to severe anaphylaxis, nurse to call physician and pharmacy when reaction occurs and call 911 if needed for immediate care   Diphenhydramine 50mg /ml IV vial: Administer 25-50mg  IV/IM PRN for first dose reaction, rash, itching, mild reaction, nurse to call physician and pharmacy when reaction occurs   Sodium Chloride  0.9% NS 500ml IV: Administer if needed for hypovolemic blood pressure drop or as ordered by physician after call to physician with anaphylactic reaction   Change dressing on IV access line weekly and PRN   Complete by: As directed    Flush IV access with Sodium Chloride  0.9% and Heparin 10 units/ml or 100 units/ml   Complete by: As directed    Home infusion instructions - [facility]   Complete by: As directed    Instructions: Flush IV access with Sodium Chloride  0.9% and Heparin 10units/ml or 100units/ml   Change dressing on IV access line: Weekly and PRN   Instructions Cath Flo 2mg : Administer for PICC Line occlusion and as ordered by physician for other access device   [facility] pharmacist to adjust dose for: Vancomycin , Aminoglycosides and other anti-infective therapies as requested by physician   Method of administration may be changed at the discretion of home infusion pharmacist based upon assessment of the patient and/or caregiver's ability to self-administer the medication  ordered   Complete by: As directed       Allergies as of 04/24/2024       Reactions   Bee Venom Swelling   Honey Bee Venom Swelling        Medication List     TAKE these medications     cefTRIAXone  IVPB Commonly known as: ROCEPHIN  Inject 2 g into the vein daily. Indication:  cervical osteomyelitis First Dose: Yes Last Day of Therapy:  05/31/24 Labs - Once weekly:  CBC/D and BMP, Labs - Once weekly: ESR and CRP Method of administration: IV Push Method of administration may be changed at the discretion of home infusion pharmacist based upon assessment of the patient and/or caregiver's ability to self-administer the medication ordered.   Cod Liver Oil Caps Take 1 capsule by mouth every evening.   Coenzyme Q10 100 MG capsule Take 100 mg by mouth daily.   daptomycin IVPB Commonly known as: CUBICIN Inject 700 mg into the vein daily. Indication:  cervical osteomyelitis First Dose: Yes Last Day of Therapy:  05/31/24 Labs - Once weekly:  CBC/D, BMP, and CPK Labs - Once weekly: ESR and CRP Method of administration: IV Push Method of administration may be changed at the discretion of home infusion pharmacist based upon assessment of the patient and/or caregiver's ability to self-administer the medication ordered.   diclofenac 75 MG EC tablet Commonly known as: VOLTAREN Take 75 mg by mouth daily as needed for mild pain (pain score 1-3).   EPINEPHrine 0.3 mg/0.3 mL Soaj injection Commonly known as: EPI-PEN Inject into the skin.   gabapentin  300 MG capsule Commonly known as: Neurontin  Take 1 capsule (300 mg total) by mouth 3 (three) times daily. What changed:  when to take this reasons to take this   Garlic 100 MG Tabs Take 100 mg by mouth daily at 6 (six) AM.   GINGER ROOT PO Take 1 tablet by mouth 2 (two) times daily.   L-Glutamine 500 MG Caps Take 1 capsule by mouth 2 (two) times daily.   methylPREDNISolone  4 MG Tbpk tablet Commonly known as: MEDROL  DOSEPAK Follow package insert   metoprolol  succinate 100 MG 24 hr tablet Commonly known as: TOPROL -XL TAKE 1 TABLET BY MOUTH ONCE DAILY TAKE  WITH  OR  IMNMEDIATELY  FOLLOWING  A  MEAL   Multi-Vitamins  Tabs Take 1 tablet by mouth daily.   oxyCODONE  5 MG immediate release tablet Commonly known as: Roxicodone  Take 1 tablet (5 mg total) by mouth every 6 (six) hours as needed for up to 10 doses. What changed: reasons to take this   pregabalin 75 MG capsule Commonly known as: LYRICA Take 75 mg by mouth daily as needed (for pain).   tiZANidine 4 MG capsule Commonly known as: ZANAFLEX Take 4 mg by mouth daily as needed for muscle spasms.               Discharge Care Instructions  (From admission, onward)           Start     Ordered   04/24/24 0000  Change dressing on IV access line weekly and PRN  (Home infusion instructions - [facility] )        04/24/24 0832            Follow-up Information     [facility] [place] Follow up on 04/23/2024.   Specialty: Infectious Diseases Why: 10/17 @ 8:30  am with Corean, NP (can be a virtual appt - please call to change if you prefer)   11/06 @ 4:00 pm with Dr. Overton [place] information: [address] [place], [address] [place]  [address] [phone]        Health, [address] Follow up.   Contact information: [address] [place] [address] [phone]         Health, [facility] Follow up.   Specialty: Home Health Services Why: home health services will be provided by [facility] information: [address] [place] [address] [phone]                 Signed: Victory DELENA Gunnels 04/24/2024, 8:32 AM

## 2024-04-24 NOTE — Plan of Care (Signed)

## 2024-04-24 NOTE — TOC Transition Note (Addendum)
 Transition of Care [facility]) - Discharge Note   Patient Details  Name: David [name] MRN: [id number] Date of Birth: Dec 14, 1950  Transition of Care [facility] CM/SW Contact:  Rosalva Jon Bloch, RN Phone Number: 04/24/2024, 10:43 AM   Clinical Narrative:    Patient will DC to: home Anticipated DC date: 04/24/2024 Family notified: yes Transport by: car      - Cervical Spine Osteomyelitis  Per MD patient ready for DC today after IV ABX infusion teaching completed with pt's daughter @ bedside by Pam with [facility]. RN, patient, patient's family, and  [facility] aware of DC.  Pt without DME needs. States has equipment @ home. Pt without RX med concerns. Pt without transportation issues. Post hospital f/u noted on AVS. Daughter to provide transportation to home.  RNCM will sign off for now as intervention is no longer needed. Please consult us  again if new needs arise.   04/24/2024 @ 1429 d/c cancelled per provider 2/2  c/o CP.  Final next level of care: Home w Home Health Services Barriers to Discharge: No Barriers Identified   Patient Goals and CMS Choice     Choice offered to / list presented to : Patient      Discharge Placement                       Discharge Plan and Services Additional resources added to the After Visit Summary for     Discharge Planning Services: CM Consult            DME Arranged: Other see comment (IV ABX infusion) DME Agency: Other - Comment ([facility]) Date DME Agency Contacted: 04/22/24 Time DME Agency Contacted: 1109 Representative spoke with at DME Agency: [facility] Arranged: RN HH Agency: Other - See comment ([facility]) Date HH Agency Contacted: 04/22/24 Time HH Agency Contacted: 1108 Representative spoke with at [facility] Agency: Pam  Social Drivers of Health (SDOH) Interventions SDOH Screenings   Food Insecurity: No Food Insecurity (04/18/2024)  Housing: Low Risk  (04/18/2024)  Transportation  Needs: No Transportation Needs (04/18/2024)  Utilities: Not At Risk (04/18/2024)  Social Connections: Moderately Integrated (04/19/2024)  Tobacco Use: Low Risk  (04/19/2024)     Readmission Risk Interventions     No data to display

## 2024-04-25 LAB — CBC
HCT: 33.7 % — ABNORMAL LOW (ref 39.0–52.0)
Hemoglobin: 10.5 g/dL — ABNORMAL LOW (ref 13.0–17.0)
MCH: 27.8 pg (ref 26.0–34.0)
MCHC: 31.2 g/dL (ref 30.0–36.0)
MCV: 89.2 fL (ref 80.0–100.0)
Platelets: 233 K/uL (ref 150–400)
RBC: 3.78 MIL/uL — ABNORMAL LOW (ref 4.22–5.81)
RDW: 16.3 % — ABNORMAL HIGH (ref 11.5–15.5)
WBC: 8 K/uL (ref 4.0–10.5)
nRBC: 0 % (ref 0.0–0.2)

## 2024-04-25 LAB — LIPID PANEL
Cholesterol: 140 mg/dL (ref 0–200)
HDL: 27 mg/dL — ABNORMAL LOW (ref >40)
LDL Cholesterol: 99 mg/dL (ref 0–99)
Total CHOL/HDL Ratio: 5.2 ratio
Triglycerides: 69 mg/dL (ref <150)
VLDL: 14 mg/dL (ref 0–40)

## 2024-04-25 LAB — BASIC METABOLIC PANEL WITH GFR
Anion gap: 7 (ref 5–15)
BUN: 13 mg/dL (ref 8–23)
CO2: 27 mmol/L (ref 22–32)
Calcium: 9.1 mg/dL (ref 8.9–10.3)
Chloride: 102 mmol/L (ref 98–111)
Creatinine, Ser: 0.81 mg/dL (ref 0.61–1.24)
GFR, Estimated: >60 mL/min (ref >60)
Glucose, Bld: 107 mg/dL — ABNORMAL HIGH (ref 70–99)
Potassium: 3.9 mmol/L (ref 3.5–5.1)
Sodium: 136 mmol/L (ref 135–145)

## 2024-04-25 LAB — TSH: TSH: 3.96 u[IU]/mL (ref 0.350–4.500)

## 2024-04-25 LAB — GLUCOSE, CAPILLARY
Glucose-Capillary: 106 mg/dL — ABNORMAL HIGH (ref 70–99)
Glucose-Capillary: 148 mg/dL — ABNORMAL HIGH (ref 70–99)
Glucose-Capillary: 171 mg/dL — ABNORMAL HIGH (ref 70–99)
Glucose-Capillary: 109 mg/dL — ABNORMAL HIGH (ref 70–99)

## 2024-04-25 LAB — MAGNESIUM: Magnesium: 1.9 mg/dL (ref 1.7–2.4)

## 2024-04-25 MED ORDER — AMIODARONE HCL 200 MG PO TABS
200.0000 mg | ORAL_TABLET | Freq: Two times a day (BID) | ORAL | Status: DC
  Administered 2024-04-25 – 2024-04-26 (×3): 200 mg via ORAL
  Filled 2024-04-25 (×3): qty 1

## 2024-04-25 NOTE — Progress Notes (Signed)
 Providing Compassionate, Quality Care - Together   Subjective: Patient reports he feels better today. He denies chest pain or pressure.  Objective: Vital signs in last 24 hours: Temp:  [98 F (36.7 C)-98.7 F (37.1 C)] 98 F (36.7 C) (10/02 0759) Pulse Rate:  [45-81] 45 (10/02 0759) Resp:  [15-17] 16 (10/02 0759) BP: (103-120)/(65-78) 120/78 (10/02 0759) SpO2:  [94 %-98 %] 94 % (10/02 0759)  Intake/Output from previous day: 10/01 0701 - 10/02 0700 In: 910.8 [P.O.:240; IV Piggyback:670.8] Out: -  Intake/Output this shift: Total I/O In: 480 [P.O.:480] Out: -   Alert and oriented x 4 PERRLA CN II-XII grossly intact MAE, Strength and sensation intact Incision is covered with Foam dressing and Steri Strips; Dressing is clean, dry, and intact   Lab Results: Recent Labs    04/25/24 0324  WBC 8.0  HGB 10.5*  HCT 33.7*  PLT 233   BMET Recent Labs    04/24/24 0353 04/25/24 0324  NA 136 136  K 3.8 3.9  CL 98 102  CO2 25 27  GLUCOSE 100* 107*  BUN 19 13  CREATININE 0.74 0.81  CALCIUM 9.1 9.1    Studies/Results: ECHOCARDIOGRAM COMPLETE Result Date: 04/24/2024    ECHOCARDIOGRAM REPORT   Patient Name:   David [name] Date of Exam: 04/24/2024 Medical Rec #:  [id number]  Height:       72.0 in Accession #:    [id number] Weight:       197.0 lb Date of Birth:  08-Dec-1950   BSA:          2.117 m Patient Age:    73 years   BP:           153/68 mmHg Patient Gender: M          HR:           110 bpm. Exam Location:  Inpatient Procedure: 2D Echo, Cardiac Doppler and Color Doppler (Both Spectral and Color            Flow Doppler were utilized during procedure). Indications:     Atrial fibrillation I48.91  History:         Patient has no prior history of Echocardiogram examinations.                  Arrythmias:Atrial Fibrillation. H/O Cancer, Spinal surgery.  Sonographer:     BERNARDA ROCKS Referring Phys:  [id number] ROBER CHROMAN Diagnosing Phys: Salena Negri MD IMPRESSIONS  1. Left  ventricular ejection fraction, by estimation, is 60 to 65%. The left ventricle has normal function. The left ventricle has no regional wall motion abnormalities. Left ventricular diastolic parameters are indeterminate.  2. Right ventricular systolic function is normal. The right ventricular size is normal.  3. Left atrial size was mild to moderately dilated.  4. Right atrial size was mildly dilated.  5. The mitral valve is degenerative. Trivial mitral valve regurgitation.  6. The aortic valve is tricuspid. There is moderate calcification of the aortic valve. There is moderate thickening of the aortic valve. Aortic valve regurgitation is not visualized. Aortic valve sclerosis/calcification is present, without any evidence of aortic stenosis.  7. There is mild (Grade II) atheroma plaque involving the aortic root and ascending aorta.  8. The inferior vena cava is normal in size with <50% respiratory variability, suggesting right atrial pressure of 8 mmHg. FINDINGS  Left Ventricle: Left ventricular ejection fraction, by estimation, is 60 to 65%. The left ventricle has normal function. The left ventricle  has no regional wall motion abnormalities. The left ventricular internal cavity size was normal in size. There is  no left ventricular hypertrophy. Left ventricular diastolic parameters are indeterminate. Right Ventricle: The right ventricular size is normal. No increase in right ventricular wall thickness. Right ventricular systolic function is normal. Left Atrium: Left atrial size was mild to moderately dilated. Right Atrium: Right atrial size was mildly dilated. Pericardium: There is no evidence of pericardial effusion. Mitral Valve: The mitral valve is degenerative in appearance. Trivial mitral valve regurgitation. MV peak gradient, 4.0 mmHg. The mean mitral valve gradient is 1.0 mmHg. Tricuspid Valve: The tricuspid valve is grossly normal. Tricuspid valve regurgitation is trivial. Aortic Valve: The aortic valve is  tricuspid. There is moderate calcification of the aortic valve. There is moderate thickening of the aortic valve. There is mild aortic valve annular calcification. Aortic valve regurgitation is not visualized. Aortic valve sclerosis/calcification is present, without any evidence of aortic stenosis. Aortic valve mean gradient measures 3.0 mmHg. Aortic valve peak gradient measures 6.4 mmHg. Aortic valve area, by VTI measures 3.25 cm. Pulmonic Valve: The pulmonic valve was normal in structure. Pulmonic valve regurgitation is not visualized. Aorta: The aortic root is normal in size and structure. There is mild (Grade II) atheroma plaque involving the aortic root and ascending aorta. Venous: The inferior vena cava is normal in size with less than 50% respiratory variability, suggesting right atrial pressure of 8 mmHg. IAS/Shunts: The atrial septum is grossly normal.  LEFT VENTRICLE PLAX 2D LVIDd:         5.70 cm LVIDs:         3.20 cm LV PW:         0.90 cm LV IVS:        1.00 cm LVOT diam:     2.10 cm LV SV:         65 LV SV Index:   31 LVOT Area:     3.46 cm  LV Volumes (MOD) LV vol d, MOD A2C: 93.9 ml LV vol d, MOD A4C: 159.0 ml LV vol s, MOD A2C: 36.6 ml LV vol s, MOD A4C: 61.8 ml LV SV MOD A2C:     57.3 ml LV SV MOD A4C:     159.0 ml LV SV MOD BP:      77.5 ml RIGHT VENTRICLE             IVC RV Basal diam:  3.60 cm     IVC diam: 1.70 cm RV S prime:     16.60 cm/s TAPSE (M-mode): 2.4 cm LEFT ATRIUM             Index        RIGHT ATRIUM           Index LA diam:        4.20 cm 1.98 cm/m   RA Area:     16.20 cm LA Vol (A2C):   92.2 ml 43.55 ml/m  RA Volume:   41.60 ml  19.65 ml/m LA Vol (A4C):   62.3 ml 29.43 ml/m LA Biplane Vol: 81.4 ml 38.45 ml/m  AORTIC VALVE                    PULMONIC VALVE AV Area (Vmax):    2.69 cm     PV Vmax:          1.19 m/s AV Area (Vmean):   2.64 cm     PV Peak grad:  5.7 mmHg AV Area (VTI):     3.25 cm     PR End Diast Vel: 1.40 msec AV Vmax:           126.00 cm/s AV Vmean:           78.000 cm/s AV VTI:            0.199 m AV Peak Grad:      6.4 mmHg AV Mean Grad:      3.0 mmHg LVOT Vmax:         97.90 cm/s LVOT Vmean:        59.400 cm/s LVOT VTI:          0.187 m LVOT/AV VTI ratio: 0.94  AORTA Ao Root diam: 3.40 cm MITRAL VALVE MV Area (PHT): 8.43 cm    SHUNTS MV Area VTI:   2.90 cm    Systemic VTI:  0.19 m MV Peak grad:  4.0 mmHg    Systemic Diam: 2.10 cm MV Mean grad:  1.0 mmHg MV Vmax:       1.00 m/s MV Vmean:      55.2 cm/s MV Decel Time: 90 msec MV E velocity: 95.40 cm/s MV A velocity: 85.90 cm/s MV E/A ratio:  1.11 Salena Negri MD Electronically signed by Salena Negri MD Signature Date/Time: 04/24/2024/4:39:52 PM    Final     Assessment/Plan: Patient underwent C5-C6-C7 decompressive laminectomy with evacuation of cervical epidural abscess. Wound culture with no growth. Patient will discharge on IV daptomycin and ceftriaxone . Patient went into A-fib with RVR and was started on Amiodarone by Dr. Levern.   LOS: 7 days   -Plan is to discharge home tomorrow.  I am in communication with my attending and they agree with the plan for this patient.   Gerard Beck, DNP, AGNP-C Nurse Practitioner  [facility] [address], [address], [place], [place] [address] P: [phone]    F: [phone]  04/25/2024, 12:02 PM

## 2024-04-25 NOTE — Progress Notes (Signed)
 Physical Therapy Treatment Patient Details Name: David [name] MRN: [id number] DOB: March 11, 1951 Today's Date: 04/25/2024   History of Present Illness David [name] is a 73 y.o. 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 Pt s/p C5-C6-C7 decompressive laminectomy with evacuation of cervical epidural abscess 9/26. 10/01 stay complicated by Afrib with RVR. PMHx: HTN, GERD, colon polyp, and skin cancer.    PT Comments  Pt up in chair on arrival, pleasant and eager for mobility. Pt demonstrating continued progress towards acute goals with good adherence to cervical precautions throughout session. Pt HR variable in Afib rhythm throughout session up to 127bpm with mobility. Pt demonstrating transfers and gait with RW for support with grossly supervision for safety with no overt LOB noted. Pt was educated on continued walker use to maximize functional independence, safety, and decrease risk for falls as well as appropriate activity progression and importance of continued mobility with pt verbalizing understanding. Pt continues to benefit from skilled PT services to progress toward functional mobility goals.     If plan is discharge home, recommend the following: A little help with walking and/or transfers;A little help with bathing/dressing/bathroom;Assistance with cooking/housework;Assist for transportation;Help with stairs or ramp for entrance   Can travel by private vehicle        Equipment Recommendations  Rolling walker (2 wheels)    Recommendations for Other Services       Precautions / Restrictions Precautions Precautions: Cervical Precaution Booklet Issued: Yes (comment) Recall of Precautions/Restrictions: Intact Precaution/Restrictions Comments: No brace needed Restrictions Weight Bearing Restrictions Per Provider Order: No     Mobility  Bed Mobility Overal bed mobility: Needs Assistance             General bed mobility comments: py up in  chair on arrival    Transfers Overall transfer level: Needs assistance Equipment used: Rolling walker (2 wheels) Transfers: Sit to/from Stand Sit to Stand: Supervision           General transfer comment: supervision for safety, slow to rise    Ambulation/Gait Ambulation/Gait assistance: Supervision, Contact guard assist Gait Distance (Feet): 500 Feet Assistive device: Rolling walker (2 wheels) Gait Pattern/deviations: Step-through pattern, Decreased stride length Gait velocity: decr     General Gait Details: Pt ambualted with a reciprocal gait pattern, even weight shift, and good foot clearence. He maintained netural head position, upright posture, and good proximity to RW. No LOB. HR variable in afib rhythm 98-127bpm with activity   Stairs             Wheelchair Mobility     Tilt Bed    Modified Rankin (Stroke Patients Only)       Balance Overall balance assessment: Needs assistance Sitting-balance support: Bilateral upper extremity supported, Feet supported Sitting balance-Leahy Scale: Good     Standing balance support: Bilateral upper extremity supported, During functional activity, Reliant on assistive device for balance Standing balance-Leahy Scale: Poor Standing balance comment: Pt dependent on RW                            Communication Communication Communication: No apparent difficulties  Cognition Arousal: Alert Behavior During Therapy: WFL for tasks assessed/performed   PT - Cognitive impairments: No apparent impairments                         Following commands: Intact      Cueing Cueing Techniques: Verbal cues, Gestural cues  Exercises      General Comments General comments (skin integrity, edema, etc.): HR variable in afib rhythm 95-102 at rest, 96-127bpm with activity, pt without c/o chest pain or dizziness/lightheadedness      Pertinent Vitals/Pain Pain Assessment Pain Assessment: No/denies pain Pain  Intervention(s): Monitored during session, Limited activity within patient's tolerance    Home Living                          Prior Function            PT Goals (current goals can now be found in the care plan section) Acute Rehab PT Goals Patient Stated Goal: Regain independence and return home PT Goal Formulation: With patient/family Time For Goal Achievement: 05/04/24 Progress towards PT goals: Progressing toward goals    Frequency    Min 2X/week      PT Plan      Co-evaluation              AM-PAC PT 6 Clicks Mobility   Outcome Measure  Help needed turning from your back to your side while in a flat bed without using bedrails?: A Little Help needed moving from lying on your back to sitting on the side of a flat bed without using bedrails?: A Little Help needed moving to and from a bed to a chair (including a wheelchair)?: A Little Help needed standing up from a chair using your arms (e.g., wheelchair or bedside chair)?: A Little Help needed to walk in hospital room?: A Little Help needed climbing 3-5 steps with a railing? : A Little 6 Click Score: 18    End of Session   Activity Tolerance: Patient tolerated treatment well Patient left: with call bell/phone within reach;with family/visitor present;in chair Nurse Communication: Mobility status PT Visit Diagnosis: Other abnormalities of gait and mobility (R26.89);Unsteadiness on feet (R26.81)     Time: 9053-8997 PT Time Calculation (min) (ACUTE ONLY): 16 min  Charges:    $Therapeutic Exercise: 8-22 mins PT General Charges $$ ACUTE PT VISIT: 1 Visit                     [name] R. PTA Acute Rehabilitation Services Office: [phone]   [name] 04/25/2024, 12:11 PM

## 2024-04-25 NOTE — Progress Notes (Signed)
 Subjective:  Patient denies any chest pain or shortness of breath denies any further palpitations remains in A-fib with moderate ventricular response did not receive IV amiodarone yesterday started on p.o. today heart rate in the 90s now.  Results of 2D echo noted  Objective:  Vital Signs in the last 24 hours: Temp:  [98 F (36.7 C)-98.7 F (37.1 C)] 98 F (36.7 C) (10/02 0759) Pulse Rate:  [45-81] 45 (10/02 0759) Resp:  [15-17] 16 (10/02 0759) BP: (103-120)/(65-78) 120/78 (10/02 0759) SpO2:  [94 %-98 %] 94 % (10/02 0759)  Intake/Output from previous day: 10/01 0701 - 10/02 0700 In: 910.8 [P.O.:240; IV Piggyback:670.8] Out: -  Intake/Output from this shift: Total I/O In: 240 [P.O.:240] Out: -   Physical Exam: Exam unchanged  Lab Results: Recent Labs    04/25/24 0324  WBC 8.0  HGB 10.5*  PLT 233   Recent Labs    04/24/24 0353 04/25/24 0324  NA 136 136  K 3.8 3.9  CL 98 102  CO2 25 27  GLUCOSE 100* 107*  BUN 19 13  CREATININE 0.74 0.81   No results for input(s): TROPONINI in the last 72 hours.  Invalid input(s): CK, MB Hepatic Function Panel No results for input(s): PROT, ALBUMIN, AST, ALT, ALKPHOS, BILITOT, BILIDIR, IBILI in the last 72 hours. Recent Labs    04/25/24 0324  CHOL 140   No results for input(s): PROTIME in the last 72 hours.  Imaging: Imaging results have been reviewed and ECHOCARDIOGRAM COMPLETE Result Date: 04/24/2024    ECHOCARDIOGRAM REPORT   Patient Name:   David [name] Date of Exam: 04/24/2024 Medical Rec #:  [id number]  Height:       72.0 in Accession #:    [id number] Weight:       197.0 lb Date of Birth:  02/25/1951   BSA:          2.117 m Patient Age:    73 years   BP:           153/68 mmHg Patient Gender: M          HR:           110 bpm. Exam Location:  Inpatient Procedure: 2D Echo, Cardiac Doppler and Color Doppler (Both Spectral and Color            Flow Doppler were utilized during procedure). Indications:      Atrial fibrillation I48.91  History:         Patient has no prior history of Echocardiogram examinations.                  Arrythmias:Atrial Fibrillation. H/O Cancer, Spinal surgery.  Sonographer:     BERNARDA ROCKS Referring Phys:  [id number] ROBER CHROMAN Diagnosing Phys: Salena Negri MD IMPRESSIONS  1. Left ventricular ejection fraction, by estimation, is 60 to 65%. The left ventricle has normal function. The left ventricle has no regional wall motion abnormalities. Left ventricular diastolic parameters are indeterminate.  2. Right ventricular systolic function is normal. The right ventricular size is normal.  3. Left atrial size was mild to moderately dilated.  4. Right atrial size was mildly dilated.  5. The mitral valve is degenerative. Trivial mitral valve regurgitation.  6. The aortic valve is tricuspid. There is moderate calcification of the aortic valve. There is moderate thickening of the aortic valve. Aortic valve regurgitation is not visualized. Aortic valve sclerosis/calcification is present, without any evidence of aortic stenosis.  7. There is mild (Grade II)  atheroma plaque involving the aortic root and ascending aorta.  8. The inferior vena cava is normal in size with <50% respiratory variability, suggesting right atrial pressure of 8 mmHg. FINDINGS  Left Ventricle: Left ventricular ejection fraction, by estimation, is 60 to 65%. The left ventricle has normal function. The left ventricle has no regional wall motion abnormalities. The left ventricular internal cavity size was normal in size. There is  no left ventricular hypertrophy. Left ventricular diastolic parameters are indeterminate. Right Ventricle: The right ventricular size is normal. No increase in right ventricular wall thickness. Right ventricular systolic function is normal. Left Atrium: Left atrial size was mild to moderately dilated. Right Atrium: Right atrial size was mildly dilated. Pericardium: There is no evidence of pericardial  effusion. Mitral Valve: The mitral valve is degenerative in appearance. Trivial mitral valve regurgitation. MV peak gradient, 4.0 mmHg. The mean mitral valve gradient is 1.0 mmHg. Tricuspid Valve: The tricuspid valve is grossly normal. Tricuspid valve regurgitation is trivial. Aortic Valve: The aortic valve is tricuspid. There is moderate calcification of the aortic valve. There is moderate thickening of the aortic valve. There is mild aortic valve annular calcification. Aortic valve regurgitation is not visualized. Aortic valve sclerosis/calcification is present, without any evidence of aortic stenosis. Aortic valve mean gradient measures 3.0 mmHg. Aortic valve peak gradient measures 6.4 mmHg. Aortic valve area, by VTI measures 3.25 cm. Pulmonic Valve: The pulmonic valve was normal in structure. Pulmonic valve regurgitation is not visualized. Aorta: The aortic root is normal in size and structure. There is mild (Grade II) atheroma plaque involving the aortic root and ascending aorta. Venous: The inferior vena cava is normal in size with less than 50% respiratory variability, suggesting right atrial pressure of 8 mmHg. IAS/Shunts: The atrial septum is grossly normal.  LEFT VENTRICLE PLAX 2D LVIDd:         5.70 cm LVIDs:         3.20 cm LV PW:         0.90 cm LV IVS:        1.00 cm LVOT diam:     2.10 cm LV SV:         65 LV SV Index:   31 LVOT Area:     3.46 cm  LV Volumes (MOD) LV vol d, MOD A2C: 93.9 ml LV vol d, MOD A4C: 159.0 ml LV vol s, MOD A2C: 36.6 ml LV vol s, MOD A4C: 61.8 ml LV SV MOD A2C:     57.3 ml LV SV MOD A4C:     159.0 ml LV SV MOD BP:      77.5 ml RIGHT VENTRICLE             IVC RV Basal diam:  3.60 cm     IVC diam: 1.70 cm RV S prime:     16.60 cm/s TAPSE (M-mode): 2.4 cm LEFT ATRIUM             Index        RIGHT ATRIUM           Index LA diam:        4.20 cm 1.98 cm/m   RA Area:     16.20 cm LA Vol (A2C):   92.2 ml 43.55 ml/m  RA Volume:   41.60 ml  19.65 ml/m LA Vol (A4C):   62.3 ml  29.43 ml/m LA Biplane Vol: 81.4 ml 38.45 ml/m  AORTIC VALVE  PULMONIC VALVE AV Area (Vmax):    2.69 cm     PV Vmax:          1.19 m/s AV Area (Vmean):   2.64 cm     PV Peak grad:     5.7 mmHg AV Area (VTI):     3.25 cm     PR End Diast Vel: 1.40 msec AV Vmax:           126.00 cm/s AV Vmean:          78.000 cm/s AV VTI:            0.199 m AV Peak Grad:      6.4 mmHg AV Mean Grad:      3.0 mmHg LVOT Vmax:         97.90 cm/s LVOT Vmean:        59.400 cm/s LVOT VTI:          0.187 m LVOT/AV VTI ratio: 0.94  AORTA Ao Root diam: 3.40 cm MITRAL VALVE MV Area (PHT): 8.43 cm    SHUNTS MV Area VTI:   2.90 cm    Systemic VTI:  0.19 m MV Peak grad:  4.0 mmHg    Systemic Diam: 2.10 cm MV Mean grad:  1.0 mmHg MV Vmax:       1.00 m/s MV Vmean:      55.2 cm/s MV Decel Time: 90 msec MV E velocity: 95.40 cm/s MV A velocity: 85.90 cm/s MV E/A ratio:  1.11 Salena Negri MD Electronically signed by Salena Negri MD Signature Date/Time: 04/24/2024/4:39:52 PM    Final     Cardiac Studies:  Assessment/Plan:  A-fib with moderate ventricular response new onset History of documented paroxysmal SVT Hypertension Cervical spinal stenosis with epidural abscess status post decompressive laminectomy postop day 6 doing well Plan Continue p.o. amiodarone as per orders Will monitor for next 24 hours on telemetry Check EKG in a.m.   LOS: 7 days    Levern Hutching 04/25/2024, 12:04 PM

## 2024-04-26 LAB — GLUCOSE, CAPILLARY
Glucose-Capillary: 98 mg/dL (ref 70–99)
Glucose-Capillary: 98 mg/dL (ref 70–99)

## 2024-04-26 LAB — BASIC METABOLIC PANEL WITH GFR
Anion gap: 8 (ref 5–15)
BUN: 11 mg/dL (ref 8–23)
CO2: 28 mmol/L (ref 22–32)
Calcium: 9.3 mg/dL (ref 8.9–10.3)
Chloride: 101 mmol/L (ref 98–111)
Creatinine, Ser: 0.77 mg/dL (ref 0.61–1.24)
GFR, Estimated: >60 mL/min (ref >60)
Glucose, Bld: 112 mg/dL — ABNORMAL HIGH (ref 70–99)
Potassium: 4.2 mmol/L (ref 3.5–5.1)
Sodium: 137 mmol/L (ref 135–145)

## 2024-04-26 LAB — ACID FAST SMEAR (AFB, MYCOBACTERIA): Acid Fast Smear: NEGATIVE

## 2024-04-26 NOTE — Discharge Summary (Signed)
 Physician Discharge Summary     Providing Compassionate, Quality Care - Together   Patient ID: David [name] MRN: [id number] DOB/AGE: 74-Jan-1952 74 y.o.  Admit date: 04/18/2024 Discharge date: 04/26/2024  Admission Diagnoses: Cervical stenosis with myelopathy, osteomyelitis/discitis  Discharge Diagnoses:  Principal Problem:   Stenosis of cervical spine with myelopathy [facility])   Discharged Condition: good  Hospital Course: Patient underwent cervical decompression with evacuation of epidural abscess by Dr. Louis on 04/19/2024. He was admitted to 5N05 following recovery from anesthesia in the PACU. His postoperative course was complicated as the patient went into atrial fibrillation with rapid ventricular response. He was evaluated by cardiology and his rate was controlled with medication. He is to follow up with cardiology in one week as an outpatient. He has worked with both physical and occupational therapies who feel the patient is ready for discharge home. He is ambulating independently and without difficulty. He is tolerating a normal diet. He is not having any bowel or bladder dysfunction. His pain is well-controlled with oral pain medication. He is ready for discharge home.   Consults: cardiology and rehabilitation medicine  Significant Diagnostic Studies: radiology: ECHOCARDIOGRAM COMPLETE Result Date: 04/24/2024    ECHOCARDIOGRAM REPORT   Patient Name:   David [name] Date of Exam: 04/24/2024 Medical Rec #:  [id number]  Height:       72.0 in Accession #:    [id number] Weight:       197.0 lb Date of Birth:  1950-12-12   BSA:          2.117 m Patient Age:    73 years   BP:           153/68 mmHg Patient Gender: M          HR:           110 bpm. Exam Location:  Inpatient Procedure: 2D Echo, Cardiac Doppler and Color Doppler (Both Spectral and Color            Flow Doppler were utilized during procedure). Indications:     Atrial fibrillation I48.91  History:         Patient has no prior history of  Echocardiogram examinations.                  Arrythmias:Atrial Fibrillation. H/O Cancer, Spinal surgery.  Sonographer:     BERNARDA ROCKS Referring Phys:  [id number] ROBER CHROMAN Diagnosing Phys: Salena Negri MD IMPRESSIONS  1. Left ventricular ejection fraction, by estimation, is 60 to 65%. The left ventricle has normal function. The left ventricle has no regional wall motion abnormalities. Left ventricular diastolic parameters are indeterminate.  2. Right ventricular systolic function is normal. The right ventricular size is normal.  3. Left atrial size was mild to moderately dilated.  4. Right atrial size was mildly dilated.  5. The mitral valve is degenerative. Trivial mitral valve regurgitation.  6. The aortic valve is tricuspid. There is moderate calcification of the aortic valve. There is moderate thickening of the aortic valve. Aortic valve regurgitation is not visualized. Aortic valve sclerosis/calcification is present, without any evidence of aortic stenosis.  7. There is mild (Grade II) atheroma plaque involving the aortic root and ascending aorta.  8. The inferior vena cava is normal in size with <50% respiratory variability, suggesting right atrial pressure of 8 mmHg. FINDINGS  Left Ventricle: Left ventricular ejection fraction, by estimation, is 60 to 65%. The left ventricle has normal function. The left ventricle has no regional wall motion abnormalities. The  left ventricular internal cavity size was normal in size. There is  no left ventricular hypertrophy. Left ventricular diastolic parameters are indeterminate. Right Ventricle: The right ventricular size is normal. No increase in right ventricular wall thickness. Right ventricular systolic function is normal. Left Atrium: Left atrial size was mild to moderately dilated. Right Atrium: Right atrial size was mildly dilated. Pericardium: There is no evidence of pericardial effusion. Mitral Valve: The mitral valve is degenerative in appearance. Trivial  mitral valve regurgitation. MV peak gradient, 4.0 mmHg. The mean mitral valve gradient is 1.0 mmHg. Tricuspid Valve: The tricuspid valve is grossly normal. Tricuspid valve regurgitation is trivial. Aortic Valve: The aortic valve is tricuspid. There is moderate calcification of the aortic valve. There is moderate thickening of the aortic valve. There is mild aortic valve annular calcification. Aortic valve regurgitation is not visualized. Aortic valve sclerosis/calcification is present, without any evidence of aortic stenosis. Aortic valve mean gradient measures 3.0 mmHg. Aortic valve peak gradient measures 6.4 mmHg. Aortic valve area, by VTI measures 3.25 cm. Pulmonic Valve: The pulmonic valve was normal in structure. Pulmonic valve regurgitation is not visualized. Aorta: The aortic root is normal in size and structure. There is mild (Grade II) atheroma plaque involving the aortic root and ascending aorta. Venous: The inferior vena cava is normal in size with less than 50% respiratory variability, suggesting right atrial pressure of 8 mmHg. IAS/Shunts: The atrial septum is grossly normal.  LEFT VENTRICLE PLAX 2D LVIDd:         5.70 cm LVIDs:         3.20 cm LV PW:         0.90 cm LV IVS:        1.00 cm LVOT diam:     2.10 cm LV SV:         65 LV SV Index:   31 LVOT Area:     3.46 cm  LV Volumes (MOD) LV vol d, MOD A2C: 93.9 ml LV vol d, MOD A4C: 159.0 ml LV vol s, MOD A2C: 36.6 ml LV vol s, MOD A4C: 61.8 ml LV SV MOD A2C:     57.3 ml LV SV MOD A4C:     159.0 ml LV SV MOD BP:      77.5 ml RIGHT VENTRICLE             IVC RV Basal diam:  3.60 cm     IVC diam: 1.70 cm RV S prime:     16.60 cm/s TAPSE (M-mode): 2.4 cm LEFT ATRIUM             Index        RIGHT ATRIUM           Index LA diam:        4.20 cm 1.98 cm/m   RA Area:     16.20 cm LA Vol (A2C):   92.2 ml 43.55 ml/m  RA Volume:   41.60 ml  19.65 ml/m LA Vol (A4C):   62.3 ml 29.43 ml/m LA Biplane Vol: 81.4 ml 38.45 ml/m  AORTIC VALVE                     PULMONIC VALVE AV Area (Vmax):    2.69 cm     PV Vmax:          1.19 m/s AV Area (Vmean):   2.64 cm     PV Peak grad:     5.7 mmHg AV Area (VTI):  3.25 cm     PR End Diast Vel: 1.40 msec AV Vmax:           126.00 cm/s AV Vmean:          78.000 cm/s AV VTI:            0.199 m AV Peak Grad:      6.4 mmHg AV Mean Grad:      3.0 mmHg LVOT Vmax:         97.90 cm/s LVOT Vmean:        59.400 cm/s LVOT VTI:          0.187 m LVOT/AV VTI ratio: 0.94  AORTA Ao Root diam: 3.40 cm MITRAL VALVE MV Area (PHT): 8.43 cm    SHUNTS MV Area VTI:   2.90 cm    Systemic VTI:  0.19 m MV Peak grad:  4.0 mmHg    Systemic Diam: 2.10 cm MV Mean grad:  1.0 mmHg MV Vmax:       1.00 m/s MV Vmean:      55.2 cm/s MV Decel Time: 90 msec MV E velocity: 95.40 cm/s MV A velocity: 85.90 cm/s MV E/A ratio:  1.11 Salena Negri MD Electronically signed by Salena Negri MD Signature Date/Time: 04/24/2024/4:39:52 PM    Final    US  EKG SITE RITE Result Date: 04/22/2024 If Site Rite image not attached, placement could not be confirmed due to current cardiac rhythm.  DG Cervical Spine 1 View Result Date: 04/19/2024 CLINICAL DATA:  Elective surgery. Foraminotomy of the cervical spine, C6-7. EXAM: DG CERVICAL SPINE - 1 VIEW COMPARISON:  Cervical spine radiograph 04/06/2024. MRI cervical spine 04/18/2024 FINDINGS: A single portable cross-table lateral view of the cervical spine is obtained intraoperatively for surgical control purposes. Retractors are demonstrated posteriorly in the neck with a localization marker placed over the C5 spinous process. Degenerative changes are demonstrated in the cervical spine most prominent at C4-5 and C5-6. Soft tissue gas consistent with surgery. IMPRESSION: Lateral view of the cervical spine obtained for surgical localization purposes. A localization marker projects over the C5 spinous process. Electronically Signed   By: Elsie Gravely M.D.   On: 04/19/2024 19:41   MR CERVICAL SPINE W WO CONTRAST Result Date:  04/18/2024 CLINICAL DATA:  Initial evaluation for epidural abscess. EXAM: MRI CERVICAL SPINE WITHOUT AND WITH CONTRAST TECHNIQUE: Multiplanar and multiecho pulse sequences of the cervical spine, to include the craniocervical junction and cervicothoracic junction, were obtained without and with intravenous contrast. CONTRAST:  9mL GADAVIST  GADOBUTROL  1 MMOL/ML IV SOLN COMPARISON:  MRI from earlier the same day. FINDINGS: Alignment: Straightening with mild reversal of the normal cervical lordosis, apex at C6-7. No listhesis. 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 Epidural involvement with epidural phlegmon/abscess extending from C5 through C6-7 (series 2, image 9). There is circumferential involvement of the epidural space. Extension into the bilateral C7 neural foramina. Resultant severe spinal stenosis with secondary cord flattening. Subtle patchy cord signal changes on sagittal STIR sequence, concerning for mild edema. Mild paraspinous inflammatory change, but no loculated paraspinous abscess. Abnormal marrow edema also noted within the C4 and C5 vertebral bodies, also likely reflecting osteomyelitis. Marrow edema and enhancement present about the right C5-6 through C7-T1 facets as well as the left C6-7 facet, also likely infected. Spinous processes of C5 through T1 demonstrate marrow edema and enhancement, likely osteomyelitis. Underlying bone marrow signal intensity diffusely heterogeneous. Marked T1 hypointensity seen within the underlying C6, C7, and T2 vertebral  bodies. Associated mild STIR signal intensity with enhancement. Findings raise the concern for possible underlying pathologic lesions, including mass or myeloma. Difficult to evaluate these findings given the superimposed acute infection. Vertebral body height maintained without acute or chronic fracture. Cord: Subtle hazy signal change within the cord at the level of C5-6 and C6-7, concerning for edema related  to adjacent epidural abscess (series 4, image 9). Otherwise normal signal morphology. Posterior Fossa, vertebral arteries, paraspinal tissues: Visualized brain and posterior fossa within normal limits for craniocervical junction within normal limits. Mild paraspinous inflammatory changes about the C6-7 level due to ongoing infection. No loculated collections. Preserved flow void seen within the vertebral arteries bilaterally. Disc levels: C2-C3: Tiny central disc protrusion indents the ventral thecal sac. Mild uncovertebral spurring and bilateral facet hypertrophy. No stenosis. C3-C4: Lobulated central to left paracentral disc protrusion flattens and indents the ventral thecal sac. Superimposed uncovertebral spurring with bilateral facet hypertrophy. Mild spinal stenosis. Severe left C4 foraminal narrowing. Right neural foramen remains patent. C4-C5: Degenerative disc space narrowing with diffuse disc osteophyte complex. Flattening and partial effacement of the ventral thecal sac. Mild left-sided facet hypertrophy. Mild spinal stenosis. Moderate left C5 foraminal narrowing. Right neural foramina remains adequately patent. C5-C6: Right eccentric disc osteophyte complex flattens and effaces the ventral thecal sac. Circumferential epidural phlegmon/abscess. Secondary cord flattening with suspected subtle cord signal changes. Moderate right C6 foraminal narrowing. Left neural foramina remains patent. C6-C7: Findings concerning for osteomyelitis discitis. Circumferential epidural phlegmon with resultant severe spinal stenosis. Mild cord flattening with subtle patchy cord signal changes. Mild left C7 foraminal narrowing. Right neural foramen is patent. C7-T1: Mild disc bulge. Epidural phlegmon/abscess within the ventral epidural space extending into the bilateral C8 foramina. No significant spinal stenosis. Foramina otherwise remain patent. IMPRESSION: 1. 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 Associated  epidural phlegmon/abscess extending from C5 through C8-T1. Resultant severe spinal stenosis with secondary cord flattening and subtle cord signal changes, concerning for edema. 2. Abnormal marrow edema and enhancement about the right C5-6 through C7-T1 facets as well as the left C6-7 facet, also likely infected. Suspected additional changes of osteomyelitis within the C4 and C5 vertebral bodies, as well as the spinous processes of C5 through T1. 3. Underlying heterogeneous marrow signal intensity with marked T1 hypointensity within the C6, C7, and T2 vertebral bodies. Findings raise the concern for possible underlying pathologic lesions, including Mets or myeloma. Difficult to evaluate these findings given the superimposed acute infection. Correlation with history and laboratory values recommended. Short interval follow-up MRI following treatment for the acute infection is suggested. Additionally, further imaging of the remaining spine may be helpful as well. 4. Underlying multilevel cervical spondylosis with resultant mild to moderate diffuse spinal stenosis at C3-4 through C6-7. Associated moderate to severe left C4 and C5 foraminal narrowing, with moderate right C6 foraminal stenosis. Electronically Signed   By: Morene Hoard M.D.   On: 04/18/2024 22:33   DG Shoulder Left Result Date: 04/06/2024 CLINICAL DATA:  Left shoulder pain, no known injury, initial EXAM: LEFT SHOULDER - 2+ VIEW COMPARISON:  None Available. FINDINGS: Degenerative changes of the acromioclavicular joint are noted. No acute fracture or dislocation is seen. Underlying rib cage limits. IMPRESSION: Degenerative change without acute abnormality. Electronically Signed   By: Oneil Devonshire M.D.   On: 04/06/2024 19:30   DG Shoulder Right Result Date: 04/06/2024 CLINICAL DATA:  Right shoulder pain, no known injury, initial encounter EXAM: RIGHT SHOULDER - 2+ VIEW COMPARISON:  None Available. FINDINGS: No acute  fracture or dislocation is  noted. Humeral head is high-riding with remodeling of the inferior acromion likely related to chronic rotator cuff injury. Chronic rib fractures are noted. IMPRESSION: Degenerative change as described suspicious for rotator cuff injury Electronically Signed   By: Oneil Devonshire M.D.   On: 04/06/2024 19:28   DG Cervical Spine Complete Result Date: 04/06/2024 CLINICAL DATA:  Neck pain with bilateral shoulder radiculopathy EXAM: CERVICAL SPINE - COMPLETE 4+ VIEW COMPARISON:  None Available. FINDINGS: Frontal, bilateral oblique, lateral views of the cervical spine are obtained on 5 images. Alignment is anatomic the cervicothoracic junction. There are no acute displaced fractures. Bridging anterior osteophytes are seen from C4 through C7, with marked disc space narrowing at the C6-7 level. Mild diffuse facet hypertrophy greatest at C3-4, C4-5, and C5-6. Left predominant neural foraminal encroachment at C3-4, with symmetrical bilateral neural foraminal narrowing at C4-5 and C5-6. Left predominant neural foraminal encroachment is seen at C6-7. Prevertebral soft tissues are unremarkable. Lung apices are clear. IMPRESSION: 1. Multilevel cervical spondylosis and facet hypertrophy as above. 2. No acute bony abnormality. Electronically Signed   By: Ozell Daring M.D.   On: 04/06/2024 18:20     Treatments: surgery: C5-C6-C7 decompressive laminectomy with evacuation of cervical epidural abscess   Discharge Exam: Blood pressure 113/76, pulse 80, temperature 98.2 F (36.8 C), temperature source Oral, resp. rate 16, height 6' (1.829 m), weight 89.4 kg, SpO2 97%.  Alert and oriented x 4 PERRLA CN II-XII grossly intact MAE, Strength and sensation intact Incision is covered with gauze dressing. Surgical wound with serosanguinous drainage. Edges are well-approximated. Fresh dressing applied.  Disposition: Discharge disposition: 06-Home-Health Care Svc       Discharge Instructions     [facility]  pharmacist to adjust dose for Vancomycin , Aminoglycosides and other anti-infective therapies as requested by physician.   Complete by: As directed    [facility] to provide Cath Flo 2mg    Complete by: As directed    Administer for PICC line occlusion and as ordered by physician for other access device issues.   Anaphylaxis Kit: Provided to treat any anaphylactic reaction to the medication being provided to the patient if First Dose or when requested by physician   Complete by: As directed    Epinephrine 1mg /ml vial / amp: Administer 0.3mg  (0.68ml) subcutaneously once for moderate to severe anaphylaxis, nurse to call physician and pharmacy when reaction occurs and call 911 if needed for immediate care   Diphenhydramine 50mg /ml IV vial: Administer 25-50mg  IV/IM PRN for first dose reaction, rash, itching, mild reaction, nurse to call physician and pharmacy when reaction occurs   Sodium Chloride  0.9% NS 500ml IV: Administer if needed for hypovolemic blood pressure drop or as ordered by physician after call to physician with anaphylactic reaction   Call MD for:  difficulty breathing, headache or visual disturbances   Complete by: As directed    Call MD for:  hives   Complete by: As directed    Call MD for:  persistant nausea and vomiting   Complete by: As directed    Call MD for:  redness, tenderness, or signs of infection (pain, swelling, redness, odor or green/yellow discharge around incision site)   Complete by: As directed    Call MD for:  severe uncontrolled pain   Complete by: As directed    Change dressing (specify)   Complete by: As directed    Dressing change: 2 times per day using gauze and tape.   Change  dressing on IV access line weekly and PRN   Complete by: As directed    Diet - low sodium heart healthy   Complete by: As directed    Face-to-face encounter (required for Medicare/Medicaid patients)   Complete by: As directed    I Victory DELENA Gunnels certify that this patient is  under my care and that I, or a nurse practitioner or physician's assistant working with me, had a face-to-face encounter that meets the physician face-to-face encounter requirements with th is patient on 04/24/2024. The encounter with the patient was in whole, or in part for the following medical condition(s) which is the primary reason for home health care (List medical condition):  osteomyelitis diskitis of cervical spine   The encounter with the patient was in whole, or in part, for the following medical condition, which is the primary reason for home health care: Osteomyelitis diskitis of cervical spine   I certify that, based on my findings, the following services are medically necessary home health services:  Physical therapy Nursing     Reason for Medically Necessary Home Health Services: Skilled Nursing- Assessment and Training for Infusion Therapy, Line Care, and Infection Control   My clinical findings support the need for the above services: Unable to leave home safely without assistance and/or assistive device   Further, I certify that my clinical findings support that this patient is homebound due to: Unsafe ambulation due to balance issues   Flush IV access with Sodium Chloride  0.9% and Heparin 10 units/ml or 100 units/ml   Complete by: As directed    Home Health   Complete by: As directed    To provide the following care/treatments:  PT OT RN     Home infusion instructions - [facility]   Complete by: As directed    Instructions: Flush IV access with Sodium Chloride  0.9% and Heparin 10units/ml or 100units/ml   Change dressing on IV access line: Weekly and PRN   Instructions Cath Flo 2mg : Administer for PICC Line occlusion and as ordered by physician for other access device   [facility] pharmacist to adjust dose for: Vancomycin , Aminoglycosides and other anti-infective therapies as requested by physician   Increase activity slowly   Complete by: As  directed    Method of administration may be changed at the discretion of home infusion pharmacist based upon assessment of the patient and/or caregiver's ability to self-administer the medication ordered   Complete by: As directed       Allergies as of 04/26/2024       Reactions   Bee Venom Swelling   Honey Bee Venom Swelling        Medication List     TAKE these medications    cefTRIAXone  IVPB Commonly known as: ROCEPHIN  Inject 2 g into the vein daily. Indication:  cervical osteomyelitis First Dose: Yes Last Day of Therapy:  05/31/24 Labs - Once weekly:  CBC/D and BMP, Labs - Once weekly: ESR and CRP Method of administration: IV Push Method of administration may be changed at the discretion of home infusion pharmacist based upon assessment of the patient and/or caregiver's ability to self-administer the medication ordered.   Cod Liver Oil Caps Take 1 capsule by mouth every evening.   Coenzyme Q10 100 MG capsule Take 100 mg by mouth daily.   daptomycin IVPB Commonly known as: CUBICIN Inject 700 mg into the vein daily. Indication:  cervical osteomyelitis First Dose: Yes Last Day of Therapy:  05/31/24 Labs -  Once weekly:  CBC/D, BMP, and CPK Labs - Once weekly: ESR and CRP Method of administration: IV Push Method of administration may be changed at the discretion of home infusion pharmacist based upon assessment of the patient and/or caregiver's ability to self-administer the medication ordered.   diclofenac 75 MG EC tablet Commonly known as: VOLTAREN Take 75 mg by mouth daily as needed for mild pain (pain score 1-3).   EPINEPHrine 0.3 mg/0.3 mL Soaj injection Commonly known as: EPI-PEN Inject into the skin.   gabapentin  300 MG capsule Commonly known as: Neurontin  Take 1 capsule (300 mg total) by mouth 3 (three) times daily. What changed:  when to take this reasons to take this   Garlic 100 MG Tabs Take 100 mg by mouth daily at 6 (six) AM.   GINGER ROOT  PO Take 1 tablet by mouth 2 (two) times daily.   L-Glutamine 500 MG Caps Take 1 capsule by mouth 2 (two) times daily.   methylPREDNISolone  4 MG Tbpk tablet Commonly known as: MEDROL  DOSEPAK Follow package insert   metoprolol  succinate 100 MG 24 hr tablet Commonly known as: TOPROL -XL TAKE 1 TABLET BY MOUTH ONCE DAILY TAKE  WITH  OR  IMNMEDIATELY  FOLLOWING  A  MEAL   Multi-Vitamins Tabs Take 1 tablet by mouth daily.   oxyCODONE  5 MG immediate release tablet Commonly known as: Roxicodone  Take 1 tablet (5 mg total) by mouth every 6 (six) hours as needed for up to 10 doses. What changed: reasons to take this   pregabalin 75 MG capsule Commonly known as: LYRICA Take 75 mg by mouth daily as needed (for pain).   tiZANidine 4 MG capsule Commonly known as: ZANAFLEX Take 4 mg by mouth daily as needed for muscle spasms.               Discharge Care Instructions  (From admission, onward)           Start     Ordered   04/26/24 0000  Change dressing (specify)       Comments: Dressing change: 2 times per day using gauze and tape.   04/26/24 1204   04/24/24 0000  Change dressing on IV access line weekly and PRN  (Home infusion instructions - [facility] )        04/24/24 0832            Follow-up Information     [facility] Dept Of [place]. [facility] Follow up on 04/23/2024.   Specialty: Infectious Diseases Why: 10/17 @ 8:30 am with Corean, NP (can be a virtual appt - please call to change if you prefer)   11/06 @ 4:00 pm with Dr. Overton Pass information: [address] [place], [address] [place]  [address] [phone]        Health, [address] Follow up.   Contact information: [address] [place] [address] [phone]         Health, [facility] Follow up.   Specialty: Home Health Services Why: home health services will be provided by [facility] Contact  information: [address] [place] [address] [phone]         Louis Shove, MD. Schedule an appointment as soon as possible for a visit in 2 week(s).   Specialty: Neurosurgery Contact information: [address] [place] [address] [phone]  Signed: Gerard Beck, DNP, AGNP-C Nurse Practitioner  [facility] [address], [place], [place] [address] P: [phone]    F: [phone]  04/26/2024, 12:07 PM

## 2024-04-26 NOTE — Progress Notes (Signed)
 Occupational Therapy Treatment Patient Details Name: David [name] MRN: [id number] DOB: [birth year] Today's Date: 04/26/2024   History of present illness David [name] is a 73 y.o. 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 Pt s/p C5-C6-C7 decompressive laminectomy with evacuation of cervical epidural abscess 9/26. 10/01 stay complicated by Afrib with RVR. PMHx: HTN, GERD, colon polyp, and skin cancer.   OT comments  Pt reports improvement in sensation in B hands and strength, faithful to use of theraputty and squeeze ball. Pt demonstrating ability to perform figure 4 to reach feet for bathing and dressing. Considering a tub transfer bench for showering to avoid stepping over edge of tub. Pt is knowledgeable in cervical precautions. He ambulate with supervision and light use of RW in hallway.       If plan is discharge home, recommend the following:  A little help with walking and/or transfers;A lot of help with bathing/dressing/bathroom;Help with stairs or ramp for entrance;Assist for transportation   Equipment Recommendations  None recommended by OT    Recommendations for Other Services      Precautions / Restrictions Precautions Precautions: Cervical Precaution Booklet Issued: Yes (comment) Recall of Precautions/Restrictions: Intact Restrictions Weight Bearing Restrictions Per Provider Order: No       Mobility Bed Mobility               General bed mobility comments: in chair    Transfers Overall transfer level: Needs assistance Equipment used: Rolling walker (2 wheels) Transfers: Sit to/from Stand Sit to Stand: Supervision           General transfer comment: increased time     Balance Overall balance assessment: Needs assistance   Sitting balance-Leahy Scale: Good     Standing balance support: Bilateral upper extremity supported Standing balance-Leahy Scale: Fair Standing balance comment: Pt lightly dependent on RW,  can stand without it                           ADL either performed or assessed with clinical judgement   ADL Overall ADL's : Needs assistance/impaired     Grooming: Modified independent;Standing               Lower Body Dressing: Set up;Sitting/lateral leans Lower Body Dressing Details (indicate cue type and reason): can perform figure 4 with increased time Toilet Transfer: Rolling walker (2 wheels);Ambulation;Comfort height toilet;Supervision/safety   Toileting- Clothing Manipulation and Hygiene: Modified independent;Sit to/from stand       Functional mobility during ADLs: Rolling walker (2 wheels);Modified independent      Extremity/Trunk Assessment              Vision       Perception     Praxis     Communication Communication Communication: No apparent difficulties   Cognition Arousal: Alert Behavior During Therapy: WFL for tasks assessed/performed Cognition: No apparent impairments             OT - Cognition Comments: well aware of cervical precautions                 Following commands: Intact        Cueing   Cueing Techniques: Verbal cues  Exercises      Shoulder Instructions       General Comments reports being faithful to use of theraputty and squeeze ball    Pertinent Vitals/ Pain       Pain Assessment Pain Assessment: No/denies pain  Home Living                                          Prior Functioning/Environment              Frequency  Min 2X/week        Progress Toward Goals  OT Goals(current goals can now be found in the care plan section)  Progress towards OT goals: Progressing toward goals  Acute Rehab OT Goals OT Goal Formulation: With patient/family Time For Goal Achievement: 05/04/24 Potential to Achieve Goals: Good  Plan      Co-evaluation                 AM-PAC OT 6 Clicks Daily Activity     Outcome Measure   Help from another person eating  meals?: None Help from another person taking care of personal grooming?: A Little Help from another person toileting, which includes using toliet, bedpan, or urinal?: None Help from another person bathing (including washing, rinsing, drying)?: A Little   Help from another person to put on and taking off regular lower body clothing?: A Little 6 Click Score: 17    End of Session Equipment Utilized During Treatment: Rolling walker (2 wheels)  OT Visit Diagnosis: Unsteadiness on feet (R26.81);Other abnormalities of gait and mobility (R26.89);Muscle weakness (generalized) (M62.81)   Activity Tolerance Patient tolerated treatment well   Patient Left in chair;with call bell/phone within reach;with family/visitor present   Nurse Communication          Time: 8993-8967 OT Time Calculation (min): 26 min  Charges: OT General Charges $OT Visit: 1 Visit OT Treatments $Self Care/Home Management : 8-22 mins $Therapeutic Activity: 8-22 mins Mliss HERO, OTR/L Acute Rehabilitation Services Office: [phone]   Kennth Mliss Helling 04/26/2024, 10:39 AM

## 2024-04-26 NOTE — Discharge Instructions (Signed)
 Wound Care Keep incision covered and dry. Change at least twice daily and as needed while draining. Do not put any creams, lotions, or ointments on incision. You are fine to shower. Let water run over incision and pat dry, then apply new dressing.  Activity Walk each and every day, increasing distance each day. No lifting greater than 5 lbs.  Avoid excessive neck motion. No driving for 2 weeks; may ride as a passenger locally.  Diet Resume your normal diet.   Return to Work Will be discussed at your follow up appointment.  Call Your Doctor If Any of These Occur Redness, drainage, or swelling at the wound.  Temperature greater than 101 degrees. Severe pain not relieved by pain medication. Incision starts to come apart.  Follow Up Appt Call [phone] today for appointment in 2-3 weeks if you don't already have one or for any problems.

## 2024-04-26 NOTE — Progress Notes (Signed)
 Subjective:  Doing well denies any chest pain or shortness of breath denies palpitation remains in A-fib with controlled ventricular response.  States was taking metoprolol  50 mg daily at home  Objective:  Vital Signs in the last 24 hours: Temp:  [98 F (36.7 C)-98.2 F (36.8 C)] 98.2 F (36.8 C) (10/03 0759) Pulse Rate:  [60-86] 80 (10/03 0759) Resp:  [16] 16 (10/03 0759) BP: (102-113)/(58-76) 113/76 (10/03 0759) SpO2:  [96 %-99 %] 97 % (10/03 0759)  Intake/Output from previous day: 10/02 0701 - 10/03 0700 In: 720 [P.O.:720] Out: -  Intake/Output from this shift: No intake/output data recorded.  Physical Exam: Neck: no adenopathy, no carotid bruit, no JVD, and supple, symmetrical, trachea midline Lungs: clear to auscultation bilaterally Heart: irregularly irregular rhythm, S1, S2 normal, and soft systolic murmur noted Abdomen: soft, non-tender; bowel sounds normal; no masses,  no organomegaly Extremities: extremities normal, atraumatic, no cyanosis or edema  Lab Results: Recent Labs    04/25/24 0324  WBC 8.0  HGB 10.5*  PLT 233   Recent Labs    04/25/24 0324 04/26/24 0219  NA 136 137  K 3.9 4.2  CL 102 101  CO2 27 28  GLUCOSE 107* 112*  BUN 13 11  CREATININE 0.81 0.77   No results for input(s): TROPONINI in the last 72 hours.  Invalid input(s): CK, MB Hepatic Function Panel No results for input(s): PROT, ALBUMIN, AST, ALT, ALKPHOS, BILITOT, BILIDIR, IBILI in the last 72 hours. Recent Labs    04/25/24 0324  CHOL 140   No results for input(s): PROTIME in the last 72 hours.  Imaging: Imaging results have been reviewed and ECHOCARDIOGRAM COMPLETE Result Date: 04/24/2024    ECHOCARDIOGRAM REPORT   Patient Name:   David [name] Date of Exam: 04/24/2024 Medical Rec #:  [id number]  Height:       72.0 in Accession #:    [id number] Weight:       197.0 lb Date of Birth:  03/10/1951   BSA:          2.117 m Patient Age:    73 years   BP:            153/68 mmHg Patient Gender: M          HR:           110 bpm. Exam Location:  Inpatient Procedure: 2D Echo, Cardiac Doppler and Color Doppler (Both Spectral and Color            Flow Doppler were utilized during procedure). Indications:     Atrial fibrillation I48.91  History:         Patient has no prior history of Echocardiogram examinations.                  Arrythmias:Atrial Fibrillation. H/O Cancer, Spinal surgery.  Sonographer:     BERNARDA ROCKS Referring Phys:  [id number] ROBER CHROMAN Diagnosing Phys: Salena Negri MD IMPRESSIONS  1. Left ventricular ejection fraction, by estimation, is 60 to 65%. The left ventricle has normal function. The left ventricle has no regional wall motion abnormalities. Left ventricular diastolic parameters are indeterminate.  2. Right ventricular systolic function is normal. The right ventricular size is normal.  3. Left atrial size was mild to moderately dilated.  4. Right atrial size was mildly dilated.  5. The mitral valve is degenerative. Trivial mitral valve regurgitation.  6. The aortic valve is tricuspid. There is moderate calcification of the aortic valve. There is moderate thickening  of the aortic valve. Aortic valve regurgitation is not visualized. Aortic valve sclerosis/calcification is present, without any evidence of aortic stenosis.  7. There is mild (Grade II) atheroma plaque involving the aortic root and ascending aorta.  8. The inferior vena cava is normal in size with <50% respiratory variability, suggesting right atrial pressure of 8 mmHg. FINDINGS  Left Ventricle: Left ventricular ejection fraction, by estimation, is 60 to 65%. The left ventricle has normal function. The left ventricle has no regional wall motion abnormalities. The left ventricular internal cavity size was normal in size. There is  no left ventricular hypertrophy. Left ventricular diastolic parameters are indeterminate. Right Ventricle: The right ventricular size is normal. No increase in right  ventricular wall thickness. Right ventricular systolic function is normal. Left Atrium: Left atrial size was mild to moderately dilated. Right [facility]: Right atrial size was mildly dilated. Pericardium: There is no evidence of pericardial effusion. Mitral Valve: The mitral valve is degenerative in appearance. Trivial mitral valve regurgitation. MV peak gradient, 4.0 mmHg. The mean mitral valve gradient is 1.0 mmHg. Tricuspid Valve: The tricuspid valve is grossly normal. Tricuspid valve regurgitation is trivial. Aortic Valve: The aortic valve is tricuspid. There is moderate calcification of the aortic valve. There is moderate thickening of the aortic valve. There is mild aortic valve annular calcification. Aortic valve regurgitation is not visualized. Aortic valve sclerosis/calcification is present, without any evidence of aortic stenosis. Aortic valve mean gradient measures 3.0 mmHg. Aortic valve peak gradient measures 6.4 mmHg. Aortic valve area, by VTI measures 3.25 cm. Pulmonic Valve: The pulmonic valve was normal in structure. Pulmonic valve regurgitation is not visualized. Aorta: The aortic root is normal in size and structure. There is mild (Grade II) atheroma plaque involving the aortic root and ascending aorta. Venous: The inferior vena cava is normal in size with less than 50% respiratory variability, suggesting right atrial pressure of 8 mmHg. IAS/Shunts: The atrial septum is grossly normal.  LEFT VENTRICLE PLAX 2D LVIDd:         5.70 cm LVIDs:         3.20 cm LV PW:         0.90 cm LV IVS:        1.00 cm LVOT diam:     2.10 cm LV SV:         65 LV SV Index:   31 LVOT Area:     3.46 cm  LV Volumes (MOD) LV vol d, MOD A2C: 93.9 ml LV vol d, MOD A4C: 159.0 ml LV vol s, MOD A2C: 36.6 ml LV vol s, MOD A4C: 61.8 ml LV SV MOD A2C:     57.3 ml LV SV MOD A4C:     159.0 ml LV SV MOD BP:      77.5 ml RIGHT VENTRICLE             IVC RV Basal diam:  3.60 cm     IVC diam: 1.70 cm RV S prime:     16.60 cm/s TAPSE  (M-mode): 2.4 cm LEFT ATRIUM             Index        RIGHT ATRIUM           Index LA diam:        4.20 cm 1.98 cm/m   RA Area:     16.20 cm LA Vol (A2C):   92.2 ml 43.55 ml/m  RA Volume:   41.60 ml  19.65 ml/m  LA Vol (A4C):   62.3 ml 29.43 ml/m LA Biplane Vol: 81.4 ml 38.45 ml/m  AORTIC VALVE                    PULMONIC VALVE AV Area (Vmax):    2.69 cm     PV Vmax:          1.19 m/s AV Area (Vmean):   2.64 cm     PV Peak grad:     5.7 mmHg AV Area (VTI):     3.25 cm     PR End Diast Vel: 1.40 msec AV Vmax:           126.00 cm/s AV Vmean:          78.000 cm/s AV VTI:            0.199 m AV Peak Grad:      6.4 mmHg AV Mean Grad:      3.0 mmHg LVOT Vmax:         97.90 cm/s LVOT Vmean:        59.400 cm/s LVOT VTI:          0.187 m LVOT/AV VTI ratio: 0.94  AORTA Ao Root diam: 3.40 cm MITRAL VALVE MV Area (PHT): 8.43 cm    SHUNTS MV Area VTI:   2.90 cm    Systemic VTI:  0.19 m MV Peak grad:  4.0 mmHg    Systemic Diam: 2.10 cm MV Mean grad:  1.0 mmHg MV Vmax:       1.00 m/s MV Vmean:      55.2 cm/s MV Decel Time: 90 msec MV E velocity: 95.40 cm/s MV A velocity: 85.90 cm/s MV E/A ratio:  1.11 Salena Negri MD Electronically signed by Salena Negri MD Signature Date/Time: 04/24/2024/4:39:52 PM    Final     Cardiac Studies:  Assessment/Plan:  A-fib with controlled ventricular response new onset History of documented paroxysmal SVT Hypertension Cervical spinal stenosis with epidural abscess status post decompressive laminectomy postop day 7 doing well Plan Will not try to attempt to chemically cardiovert in view of patient not being on anticoagulant in view of recent spinal surgery. Switch back to metoprolol  succinate 50 mg daily DC amiodarone for now Okay to discharge from cardiac point of view Follow-up with [facility] in 1 week  LOS: 8 days    David [name] 04/26/2024, 11:15 AM
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
## 2024-05-10 ENCOUNTER — Other Ambulatory Visit: Payer: Self-pay

## 2024-05-10 ENCOUNTER — Ambulatory Visit (INDEPENDENT_AMBULATORY_CARE_PROVIDER_SITE_OTHER): Admitting: Infectious Diseases

## 2024-05-10 VITALS — BP 182/70 | HR 53 | Temp 97.9°F | Resp 16

## 2024-05-10 DIAGNOSIS — M791 Myalgia, unspecified site: Secondary | ICD-10-CM

## 2024-05-10 DIAGNOSIS — G061 Intraspinal abscess and granuloma: Secondary | ICD-10-CM | POA: Diagnosis not present

## 2024-05-10 DIAGNOSIS — M4624 Osteomyelitis of vertebra, thoracic region: Secondary | ICD-10-CM | POA: Diagnosis not present

## 2024-05-10 DIAGNOSIS — E441 Mild protein-calorie malnutrition: Secondary | ICD-10-CM

## 2024-05-10 DIAGNOSIS — K5981 Ogilvie syndrome: Secondary | ICD-10-CM | POA: Diagnosis not present

## 2024-05-10 NOTE — Patient Instructions (Addendum)
   Would try a glycerine suppository today - talk with your primary care team about   Call Dr. Sisto office to let them know he is having some bad constipation with the oxycodone  and request if he can try tramadol instead. OK to add tylenol  (2 extra strength up to 3 times a day every 8 hours).   You will come back to see Dr. Overton next on November 6th before your antibiotics end.

## 2024-05-10 NOTE — Progress Notes (Signed)
 7 Day Zio patch applied per Channing Benders, NP for palpitations, PAF, SVT.
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
## 2024-05-11 LAB — CK: Total CK: 18 U/L — ABNORMAL LOW (ref 19–278)

## 2024-05-11 LAB — C-REACTIVE PROTEIN: CRP: 69.9 mg/L — ABNORMAL HIGH (ref <8.0)

## 2024-05-11 LAB — SEDIMENTATION RATE: Sed Rate: 91 mm/h — ABNORMAL HIGH (ref 0–20)

## 2024-05-12 LAB — CULTURE, FUNGUS WITHOUT SMEAR

## 2024-05-13 ENCOUNTER — Ambulatory Visit: Payer: Self-pay | Admitting: Infectious Diseases

## 2024-05-13 NOTE — Telephone Encounter (Signed)
**  TRIAGE CALL**  Reason for Call:pt's ankles are swelling going on for a couple of days  Pls call to advise  Patient Complaint/Problem:  Onset Date:  Cardiologist/Vascular Surgeon:Bailey  Best Call Back Number:[phone]

## 2024-05-13 NOTE — Telephone Encounter (Addendum)
 TRIAGE  SITUATION - Feet/ankles swollen  BACKGROUND -Chest pain, HTN, Palpitations, Dyslipidemia, Hx cardiac arrhythmia, SVT, PAF  ASSESSMENT - Patient's daughter Terrilyn) states patient's feet/ankles started swelling x 2 days ago, & has gotten worse. Denies SHOB. States elevation doesn't help. States this is new for patient. States patient currently, wearing heart monitor.  RECOMMENDATION - Advised daughter that I will consult provider. Daughter acknowledges in agreement, & verbalizes understanding.

## 2024-05-14 ENCOUNTER — Emergency Department (HOSPITAL_BASED_OUTPATIENT_CLINIC_OR_DEPARTMENT_OTHER)

## 2024-05-14 ENCOUNTER — Other Ambulatory Visit: Payer: Self-pay

## 2024-05-14 ENCOUNTER — Emergency Department (HOSPITAL_BASED_OUTPATIENT_CLINIC_OR_DEPARTMENT_OTHER)
Admission: EM | Admit: 2024-05-14 | Discharge: 2024-05-14 | Disposition: A | Discharge status: Home / Self Care | Attending: Emergency Medicine | Admitting: Emergency Medicine

## 2024-05-14 ENCOUNTER — Encounter (HOSPITAL_BASED_OUTPATIENT_CLINIC_OR_DEPARTMENT_OTHER): Payer: Self-pay

## 2024-05-14 DIAGNOSIS — M25474 Effusion, right foot: Secondary | ICD-10-CM | POA: Diagnosis not present

## 2024-05-14 DIAGNOSIS — M25471 Effusion, right ankle: Secondary | ICD-10-CM | POA: Diagnosis present

## 2024-05-14 DIAGNOSIS — M25475 Effusion, left foot: Secondary | ICD-10-CM | POA: Insufficient documentation

## 2024-05-14 DIAGNOSIS — R7989 Other specified abnormal findings of blood chemistry: Secondary | ICD-10-CM | POA: Insufficient documentation

## 2024-05-14 DIAGNOSIS — M25472 Effusion, left ankle: Secondary | ICD-10-CM | POA: Diagnosis not present

## 2024-05-14 DIAGNOSIS — R600 Localized edema: Secondary | ICD-10-CM | POA: Diagnosis present

## 2024-05-14 LAB — CBC
HCT: 28.5 % — ABNORMAL LOW (ref 39.0–52.0)
Hemoglobin: 9.3 g/dL — ABNORMAL LOW (ref 13.0–17.0)
MCH: 28.2 pg (ref 26.0–34.0)
MCHC: 32.6 g/dL (ref 30.0–36.0)
MCV: 86.4 fL (ref 80.0–100.0)
Platelets: 243 K/uL (ref 150–400)
RBC: 3.3 MIL/uL — ABNORMAL LOW (ref 4.22–5.81)
RDW: 15.5 % (ref 11.5–15.5)
WBC: 7.7 K/uL (ref 4.0–10.5)
nRBC: 0 % (ref 0.0–0.2)

## 2024-05-14 LAB — PRO BRAIN NATRIURETIC PEPTIDE: Pro Brain Natriuretic Peptide: 766 pg/mL — ABNORMAL HIGH (ref <300.0)

## 2024-05-14 LAB — BASIC METABOLIC PANEL WITH GFR
Anion gap: 12 (ref 5–15)
BUN: 26 mg/dL — ABNORMAL HIGH (ref 8–23)
CO2: 26 mmol/L (ref 22–32)
Calcium: 12 mg/dL — ABNORMAL HIGH (ref 8.9–10.3)
Chloride: 101 mmol/L (ref 98–111)
Creatinine, Ser: 0.87 mg/dL (ref 0.61–1.24)
GFR, Estimated: >60 mL/min (ref >60)
Glucose, Bld: 102 mg/dL — ABNORMAL HIGH (ref 70–99)
Potassium: 3.4 mmol/L — ABNORMAL LOW (ref 3.5–5.1)
Sodium: 138 mmol/L (ref 135–145)

## 2024-05-14 MED ORDER — FUROSEMIDE 20 MG PO TABS
20.0000 mg | ORAL_TABLET | Freq: Once | ORAL | Status: AC
  Administered 2024-05-14: 20 mg via ORAL
  Filled 2024-05-14: qty 1

## 2024-05-14 MED ORDER — POTASSIUM CHLORIDE CRYS ER 20 MEQ PO TBCR
20.0000 meq | EXTENDED_RELEASE_TABLET | Freq: Once | ORAL | Status: AC
  Administered 2024-05-14: 20 meq via ORAL
  Filled 2024-05-14: qty 1

## 2024-05-14 MED ORDER — TRAMADOL HCL 50 MG PO TABS
50.0000 mg | ORAL_TABLET | Freq: Once | ORAL | Status: AC
  Administered 2024-05-14: 50 mg via ORAL
  Filled 2024-05-14: qty 1

## 2024-05-14 MED ORDER — POTASSIUM CHLORIDE CRYS ER 20 MEQ PO TBCR: 20.0000 meq | EXTENDED_RELEASE_TABLET | Freq: Every day | ORAL | 0 refills | Status: DC

## 2024-05-14 MED ORDER — FUROSEMIDE 20 MG PO TABS: 20.0000 mg | ORAL_TABLET | Freq: Every day | ORAL | 0 refills | Status: DC

## 2024-05-14 NOTE — Discharge Instructions (Signed)
 Thank you for letting us  evaluate you today.  Your lab work was notable for an elevated BNP this indicates extra fluid on the heart causing the heart to work a little bit harder which is likely causing the swelling in the legs from back up. No fluid noted in chest XR. I have given you a prescription for Lasix to pee off the fluid.  I have also provided you with potassium as Lasix causes low potassium.  Please call cardiology and have him seen this week for etiology of elevated BNP, bilateral ankle swelling, mildly enlarged heart/cardiomegaly  Return to [facility] emergency department or [facility] Emergency Department if you experience chest pain, shortness of breath, significant worsening of swelling

## 2024-05-14 NOTE — ED Notes (Signed)
 Per Dr. Cottie, pt's PICC line may be utilized for lab draws and medication administration while in ER.

## 2024-05-14 NOTE — ED Triage Notes (Signed)
 Reports BIL ankle swelling since Saturday.  Denies chest pain, SHOB  Reports [facility] at [facility]

## 2024-05-14 NOTE — ED Notes (Signed)
 Pt transferred from [facility] to ED RM 11. Assuming pt care at this time.

## 2024-05-14 NOTE — ED Provider Notes (Signed)
 [facility] Provider Note   CSN: [id number] Arrival date & time: 05/14/24  1508     Patient presents with: Leg Swelling   David [name] is a 73 y.o. male with past medical history of cervical stenosis with osteomyelitis s/p C5-C7 decompressive laminectomy with evacuation of cervical epidural abscess (on PICC line with IV antibiotics) presents Emergency Department for evaluation of bilateral ankle swelling that started 4 days ago.  No history of DVT, PE.  Has been elevating legs and going to PT following cervical. Denies recent trauma, chest pain, shortness of breath, fevers, thinners.   HPI     Prior to Admission medications   Medication Sig Start Date End Date Taking? Authorizing Provider  furosemide (LASIX) 20 MG tablet Take 1 tablet (20 mg total) by mouth daily for 5 days. 05/14/24 05/19/24 Yes Minnie Tinnie BRAVO, PA  potassium chloride SA (KLOR-CON M) 20 MEQ tablet Take 1 tablet (20 mEq total) by mouth daily for 5 days. 05/14/24 05/19/24 Yes Minnie Tinnie BRAVO, PA  cefTRIAXone  (ROCEPHIN ) IVPB Inject 2 g into the vein daily. Indication:  cervical osteomyelitis First Dose: Yes Last Day of Therapy:  05/31/24 Labs - Once weekly:  CBC/D and BMP, Labs - Once weekly: ESR and CRP Method of administration: IV Push Method of administration may be changed at the discretion of home infusion pharmacist based upon assessment of the patient and/or caregiver's ability to self-administer the medication ordered. 04/24/24 06/02/24  Louis Shove, MD  Cod Liver Oil CAPS Take 1 capsule by mouth every evening.    [provider]  Coenzyme Q10 100 MG capsule Take 100 mg by mouth daily.    [provider]  daptomycin (CUBICIN) IVPB Inject 700 mg into the vein daily. Indication:  cervical osteomyelitis First Dose: Yes Last Day of Therapy:  05/31/24 Labs - Once weekly:  CBC/D, BMP, and CPK Labs - Once weekly: ESR and CRP Method of administration: IV  Push Method of administration may be changed at the discretion of home infusion pharmacist based upon assessment of the patient and/or caregiver's ability to self-administer the medication ordered. 04/24/24 06/02/24  Louis Shove, MD  diclofenac (VOLTAREN) 75 MG EC tablet Take 75 mg by mouth daily as needed for mild pain (pain score 1-3).    [provider]  EPINEPHrine 0.3 mg/0.3 mL IJ SOAJ injection Inject into the skin. 03/09/14   [provider]  gabapentin  (NEURONTIN ) 300 MG capsule Take 1 capsule (300 mg total) by mouth 3 (three) times daily. Patient not taking: Reported on 05/10/2024 08/19/18   Loetta Senior, MD  Garlic 100 MG TABS Take 100 mg by mouth daily at 6 (six) AM.    [provider]  Ginger, Zingiber officinalis, (GINGER ROOT PO) Take 1 tablet by mouth 2 (two) times daily.    [provider]  L-Glutamine 500 MG CAPS Take 1 capsule by mouth 2 (two) times daily.    [provider]  methylPREDNISolone  (MEDROL  DOSEPAK) 4 MG TBPK tablet Follow package insert Patient not taking: Reported on 04/18/2024 04/06/24   Ruthe Cornet, DO  metoprolol  succinate (TOPROL -XL) 100 MG 24 hr tablet TAKE 1 TABLET BY MOUTH ONCE DAILY TAKE  WITH  OR  IMNMEDIATELY  FOLLOWING  A  MEAL 06/10/19   Wendling, Mabel Mt, DO  Multiple Vitamin (MULTI-VITAMINS) TABS Take 1 tablet by mouth daily.    [provider]  oxyCODONE  (ROXICODONE ) 5 MG immediate release tablet Take 1 tablet (5 mg total)  by mouth every 6 (six) hours as needed for up to 10 doses. 04/06/24   Curatolo, Adam, DO  pregabalin (LYRICA) 75 MG capsule Take 75 mg by mouth daily as needed (for pain).    [provider]  tiZANidine (ZANAFLEX) 4 MG capsule Take 4 mg by mouth daily as needed for muscle spasms.    [provider]    Allergies: Bee venom and Honey bee venom    Review of Systems  Respiratory:  Negative for chest tightness and shortness of breath.     Updated Vital  Signs BP (!) 183/72 (BP Location: Left Arm)   Pulse 83   Temp 98.6 F (37 C) (Oral)   Resp 11   Ht 6' (1.829 m)   Wt 89.4 kg   SpO2 96%   BMI 26.73 kg/m   Physical Exam Vitals and nursing note reviewed.  Constitutional:      General: He is not in acute distress.    Appearance: Normal appearance.  HENT:     Head: Normocephalic and atraumatic.  Eyes:     Conjunctiva/sclera: Conjunctivae normal.  Cardiovascular:     Rate and Rhythm: Normal rate.     Pulses:          Dorsalis pedis pulses are 2+ on the right side and 2+ on the left side.  Pulmonary:     Effort: Pulmonary effort is normal. No respiratory distress.     Breath sounds: Normal breath sounds.  Abdominal:     General: Bowel sounds are normal. There is no distension.     Palpations: Abdomen is soft.     Tenderness: There is no abdominal tenderness. There is no guarding or rebound.  Musculoskeletal:     Comments: Mild nonpitting edema in ankles bilaterally. Negative Homan sign x2. No calf nor tenderness in BLE  Skin:    Coloration: Skin is not jaundiced or pale.     Comments: No erythema, warmth, streaking, swelling to thighs, calves bilaterally.  Neurological:     Mental Status: He is alert and oriented to person, place, and time. Mental status is at baseline.     (all labs ordered are listed, but only abnormal results are displayed) Labs Reviewed  BASIC METABOLIC PANEL WITH GFR - Abnormal; Notable for the following components:      Result Value   Potassium 3.4 (*)    Glucose, Bld 102 (*)    BUN 26 (*)    Calcium 12.0 (*)    All other components within normal limits  CBC - Abnormal; Notable for the following components:   RBC 3.30 (*)    Hemoglobin 9.3 (*)    HCT 28.5 (*)    All other components within normal limits  PRO BRAIN NATRIURETIC PEPTIDE - Abnormal; Notable for the following components:   Pro Brain Natriuretic Peptide 766.0 (*)    All other components within normal limits    EKG: EKG  Interpretation Date/Time:  Tuesday May 14 2024 15:22:45 EDT Ventricular Rate:  59 PR Interval:  141 QRS Duration:  100 QT Interval:  435 QTC Calculation: 431 R Axis:   70  Text Interpretation: Sinus rhythm Confirmed by Cottie Cough [phone]) on 05/14/2024 4:08:00 PM  Radiology: DG Chest Portable 1 View Result Date: 05/14/2024 CLINICAL DATA:  Bilateral ankle swelling, recent surgery, r/o effusion EXAM: PORTABLE CHEST - 1 VIEW COMPARISON:  08/30/2017 FINDINGS: Right PICC terminating in the mid SVC. Radiopaque metallic structure overlying the left upper lung zone, likely external to  the patient. No focal airspace consolidation, pleural effusion, or pneumothorax. Mild cardiomegaly. Tortuous aorta with aortic atherosclerosis. Multilevel thoracic osteophytosis. IMPRESSION: 1. Mild cardiomegaly. No pneumonia or pulmonary edema. 2. Right PICC terminates in the mid SVC. Electronically Signed   By: Rogelia Myers M.D.   On: 05/14/2024 18:36    Medications Ordered in the ED  traMADol (ULTRAM) tablet 50 mg (50 mg Oral Given 05/14/24 1614)  furosemide (LASIX) tablet 20 mg (20 mg Oral Given 05/14/24 1931)  potassium chloride SA (KLOR-CON M) CR tablet 20 mEq (20 mEq Oral Given 05/14/24 1932)                                    Medical Decision Making Amount and/or Complexity of Data Reviewed Labs: ordered. Radiology: ordered.  Risk Prescription drug management.   Patient presents to the ED for concern of bilateral lower extremity swelling, this involves an extensive number of treatment options, and is a complaint that carries with it a high risk of complications and morbidity.  The differential diagnosis includes heart failure, fluid overload, dependent edema, infection, cellulitis, DVT   Co morbidities that complicate the patient evaluation  See HPI   Additional history obtained:  Additional history obtained from Nursing, Outside Medical Records, and Past Admission   External  records from outside source obtained and reviewed including triage RN note   Lab Tests:  I Ordered, and personally interpreted labs.  The pertinent results include:   BNP 766 Potassium 3.4 CBG 106 Hgb 9.3   Imaging Studies ordered:  I ordered imaging studies including CXR  I independently visualized and interpreted imaging which showed mild cardiomegaly I agree with the radiologist interpretation   Cardiac Monitoring:  The patient was maintained on a cardiac monitor.  I personally viewed and interpreted the cardiac monitored which showed an underlying rhythm of: NSR with no sT nor T wave abnormalities   Medicines ordered and prescription drug management:  I ordered medication including lasix, K+  for diuresis and K+ supplementation  I have reviewed the patients home medicines and have made adjustments as needed     Problem List / ED Course:  Bilateral ankle swelling No signs of infection nor cellulitis Swelling is nonpitting and 1+ bilaterally No tenderness to calf nor ankles bilaterally. No hx of DVT/PE. Absolutely no tenderness to thigh, calf, ankles bilaterally. No erythema nor warmth. Low suspicion for DVT Extremities well-perfused with easily palpable pulse.  Sensation intact. Last echo showed LVEF of 60-65% on 04/24/2024 BNP today 766 with no previous to compare to Not currently on lasix. Creatinine WNL No complaints of chest pain or shortness of breath X-ray without effusion.  Mild cardiomegaly Maintaining oxygen saturation without supplementation No complaints of cp, shob while in ED Did have 1 episode of A-fib that lasted less than a minute and spontaneously converted back to NSR.  Has a history of PAF.  Never had complaints of chest pain, shortness of breath Provided lasix 20mg  and K+ for next 5 days and will have pt f/u with cards   Reevaluation:  After the interventions noted above, I reevaluated the patient and found that they have :stayed the  same    Dispostion:  After consideration of the diagnostic results and the patients response to treatment, I feel that the patent would benefit from outpatient management with cardiology f/u.   Discussed ED workup, disposition, return to ED precautions with patient who  expresses understanding agrees with plan.  All questions answered to their satisfaction.  They are agreeable to plan.  Discharge instructions provided on paperwork  Final diagnoses:  Bilateral swelling of feet and ankles  Elevated brain natriuretic peptide (BNP) level    ED Discharge Orders          Ordered    furosemide (LASIX) 20 MG tablet  Daily        05/14/24 1922    potassium chloride SA (KLOR-CON M) 20 MEQ tablet  Daily        05/14/24 1922             Minnie Tinnie BRAVO, [place] 05/14/24 1957    Cottie Donnice PARAS, MD 05/14/24 2256

## 2024-05-15 ENCOUNTER — Other Ambulatory Visit: Payer: Self-pay

## 2024-05-15 ENCOUNTER — Emergency Department (HOSPITAL_COMMUNITY)
Admission: EM | Admit: 2024-05-15 | Discharge: 2024-05-15 | Disposition: A | Discharge status: Home / Self Care | Attending: Emergency Medicine | Admitting: Emergency Medicine

## 2024-05-15 ENCOUNTER — Emergency Department (HOSPITAL_COMMUNITY)

## 2024-05-15 DIAGNOSIS — I4891 Unspecified atrial fibrillation: Secondary | ICD-10-CM | POA: Diagnosis present

## 2024-05-15 DIAGNOSIS — R0602 Shortness of breath: Secondary | ICD-10-CM | POA: Diagnosis not present

## 2024-05-15 DIAGNOSIS — I480 Paroxysmal atrial fibrillation: Secondary | ICD-10-CM | POA: Insufficient documentation

## 2024-05-15 DIAGNOSIS — I700 Atherosclerosis of aorta: Secondary | ICD-10-CM | POA: Diagnosis not present

## 2024-05-15 DIAGNOSIS — E876 Hypokalemia: Secondary | ICD-10-CM | POA: Diagnosis not present

## 2024-05-15 DIAGNOSIS — R000 Tachycardia, unspecified: Secondary | ICD-10-CM | POA: Insufficient documentation

## 2024-05-15 DIAGNOSIS — D72819 Decreased white blood cell count, unspecified: Secondary | ICD-10-CM | POA: Diagnosis not present

## 2024-05-15 DIAGNOSIS — I517 Cardiomegaly: Secondary | ICD-10-CM | POA: Insufficient documentation

## 2024-05-15 LAB — CBC WITH DIFFERENTIAL/PLATELET
Abs Immature Granulocytes: 0.02 K/uL (ref 0.00–0.07)
Basophils Absolute: 0 K/uL (ref 0.0–0.1)
Basophils Relative: 1 %
Eosinophils Absolute: 0 K/uL (ref 0.0–0.5)
Eosinophils Relative: 1 %
HCT: 53.2 % — ABNORMAL HIGH (ref 39.0–52.0)
Hemoglobin: 17.5 g/dL — ABNORMAL HIGH (ref 13.0–17.0)
Immature Granulocytes: 1 %
Lymphocytes Relative: 25 %
Lymphs Abs: 0.8 K/uL (ref 0.7–4.0)
MCH: 27.8 pg (ref 26.0–34.0)
MCHC: 32.9 g/dL (ref 30.0–36.0)
MCV: 84.4 fL (ref 80.0–100.0)
Monocytes Absolute: 0.3 K/uL (ref 0.1–1.0)
Monocytes Relative: 9 %
Neutro Abs: 2.1 K/uL (ref 1.7–7.7)
Neutrophils Relative %: 63 %
Platelets: 142 K/uL — ABNORMAL LOW (ref 150–400)
RBC: 6.3 MIL/uL — ABNORMAL HIGH (ref 4.22–5.81)
RDW: 15.5 % (ref 11.5–15.5)
WBC: 3.2 K/uL — ABNORMAL LOW (ref 4.0–10.5)
nRBC: 0 % (ref 0.0–0.2)

## 2024-05-15 LAB — COMPREHENSIVE METABOLIC PANEL WITH GFR
ALT: 20 U/L (ref 0–44)
AST: 19 U/L (ref 15–41)
Albumin: 2.6 g/dL — ABNORMAL LOW (ref 3.5–5.0)
Alkaline Phosphatase: 154 U/L — ABNORMAL HIGH (ref 38–126)
Anion gap: 7 (ref 5–15)
BUN: 18 mg/dL (ref 8–23)
CO2: 28 mmol/L (ref 22–32)
Calcium: 10.9 mg/dL — ABNORMAL HIGH (ref 8.9–10.3)
Chloride: 100 mmol/L (ref 98–111)
Creatinine, Ser: 1.01 mg/dL (ref 0.61–1.24)
GFR, Estimated: >60 mL/min (ref >60)
Glucose, Bld: 108 mg/dL — ABNORMAL HIGH (ref 70–99)
Potassium: 3 mmol/L — ABNORMAL LOW (ref 3.5–5.1)
Sodium: 135 mmol/L (ref 135–145)
Total Bilirubin: 0.6 mg/dL (ref 0.0–1.2)
Total Protein: 6.4 g/dL — ABNORMAL LOW (ref 6.5–8.1)

## 2024-05-15 LAB — TSH: TSH: 5.715 u[IU]/mL — ABNORMAL HIGH (ref 0.350–4.500)

## 2024-05-15 LAB — BRAIN NATRIURETIC PEPTIDE: B Natriuretic Peptide: 145.2 pg/mL — ABNORMAL HIGH (ref 0.0–100.0)

## 2024-05-15 LAB — MAGNESIUM: Magnesium: 2.1 mg/dL (ref 1.7–2.4)

## 2024-05-15 LAB — I-STAT CHEM 8, ED
BUN: 21 mg/dL (ref 8–23)
Calcium, Ion: 1.29 mmol/L (ref 1.15–1.40)
Chloride: 103 mmol/L (ref 98–111)
Creatinine, Ser: 1 mg/dL (ref 0.61–1.24)
Glucose, Bld: 108 mg/dL — ABNORMAL HIGH (ref 70–99)
HCT: 29 % — ABNORMAL LOW (ref 39.0–52.0)
Hemoglobin: 9.9 g/dL — ABNORMAL LOW (ref 13.0–17.0)
Potassium: 3.4 mmol/L — ABNORMAL LOW (ref 3.5–5.1)
Sodium: 138 mmol/L (ref 135–145)
TCO2: 27 mmol/L (ref 22–32)

## 2024-05-15 LAB — T4, FREE: Free T4: 0.68 ng/dL (ref 0.61–1.12)

## 2024-05-15 MED ORDER — POTASSIUM CHLORIDE CRYS ER 20 MEQ PO TBCR
40.0000 meq | EXTENDED_RELEASE_TABLET | Freq: Once | ORAL | Status: AC
Start: 2024-05-15 — End: 2024-05-15
  Administered 2024-05-15: 40 meq via ORAL
  Filled 2024-05-15: qty 2

## 2024-05-15 MED ORDER — SODIUM CHLORIDE 0.9 % IV BOLUS
500.0000 mL | Freq: Once | INTRAVENOUS | Status: AC
  Administered 2024-05-15: 500 mL via INTRAVENOUS

## 2024-05-15 NOTE — Discharge Instructions (Addendum)
 You were in atrial fibrillation however this resolved with EMS.  Your labs were overall reassuring, showed mild low potassium likely due to your Lasix use, continue with outpatient potassium supplementation.

## 2024-05-15 NOTE — ED Notes (Signed)
 Paper work reviewed with pt and daughter. Pt is getting dressed to be taken to lobby to wait for daughter to get car and his walker. Pt is in no new onset distress at this time.

## 2024-05-15 NOTE — ED Notes (Signed)
 Poor return from IV, RN could only draw Istat chem-8. Phlebotomy asked to stick.

## 2024-05-15 NOTE — ED Triage Notes (Signed)
 According to [facility] em[facility]: Pt is coming from home, pt felt palpations post spinal procedure (discharged ~2x weeks ago). Pt self converted out of afib during that stay. Pt went into afib last night. Pt is on zio monitor. Pt felt heart rate go up and it did not come back down within 10 minutes so pt called for ems.  HR was between 130-148 afib on EKG. However near arrival pt appeared to convert to a sinus rhythm.Chest pain denied at this time and Asprin not given due to this.  18 piv in left forearm.Pt also has a pic in right bicep.  Vitals: 137/80 HR 94 sinus rhythm Rr 12 94%spo2 Cbg 127

## 2024-05-15 NOTE — ED Provider Notes (Signed)
 [place] EMERGENCY DEPARTMENT AT [facility] Provider Note   CSN: [id number] Arrival date & time: 05/15/24  1111     Patient presents with: No chief complaint on file.   David [name] is a 73 y.o. male.   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  He has a history of paroxysmal atrial fibrillation and is on metoprolol  outpatient.  He is not currently on any anticoagulation.  He is receiving Rocephin  and daptomycin IV, last day of therapy is in the chart to be 05/31/2024.  Per EMS, the patient is coming from home.  He felt palpitations post spinal procedure and had been in atrial fibrillation and then self converted postoperatively.  He states that he went into atrial fibrillation last night.  He is on a ZIO monitor.  His heart rate was between the 130s and 140s on telemetry with EMS and then with EMS he spontaneously cardioverted.  He is now completely asymptomatic.  Prior to Admission medications   Medication Sig Start Date End Date Taking? Authorizing Provider  cefTRIAXone  (ROCEPHIN ) IVPB Inject 2 g into the vein daily. Indication:  cervical osteomyelitis First Dose: Yes Last Day of Therapy:  05/31/24 Labs - Once weekly:  CBC/D and BMP, Labs - Once weekly: ESR and CRP Method of administration: IV Push Method of administration may be changed at the discretion of home infusion pharmacist based upon assessment of the patient and/or caregiver's ability to self-administer the medication ordered. 04/24/24 06/02/24  Louis Shove, MD  Cod Liver Oil CAPS Take 1 capsule by mouth every evening.    [provider]  Coenzyme Q10 100 MG capsule Take 100 mg by mouth daily.    [provider]  daptomycin (CUBICIN) IVPB  Inject 700 mg into the vein daily. Indication:  cervical osteomyelitis First Dose: Yes Last Day of Therapy:  05/31/24 Labs - Once weekly:  CBC/D, BMP, and CPK Labs - Once weekly: ESR and CRP Method of administration: IV Push Method of administration may be changed at the discretion of home infusion pharmacist based upon assessment of the patient and/or caregiver's ability to self-administer the medication ordered. 04/24/24 06/02/24  Louis Shove, MD  diclofenac (VOLTAREN) 75 MG EC tablet Take 75 mg by mouth daily as needed for mild pain (pain score 1-3).    [provider]  EPINEPHrine 0.3 mg/0.3 mL IJ SOAJ injection Inject into the skin. 03/09/14   [provider]  furosemide (LASIX) 20 MG tablet Take 1 tablet (20 mg total) by mouth daily for 5 days. 05/14/24 05/19/24  Minnie Tinnie BRAVO, PA  gabapentin  (NEURONTIN ) 300 MG capsule Take 1 capsule (300 mg total) by mouth 3 (three) times daily. Patient not taking: Reported on 05/10/2024 08/19/18   Loetta Senior, MD  Garlic 100 MG TABS Take 100 mg by mouth daily at 6 (six) AM.    [provider]  Ginger, Zingiber officinalis, (GINGER ROOT PO) Take 1 tablet by mouth 2 (two) times daily.    [provider]  L-Glutamine 500 MG CAPS Take 1 capsule by mouth 2 (two) times daily.    [provider]  methylPREDNISolone  (MEDROL  DOSEPAK) 4 MG TBPK tablet Follow package insert Patient not taking: Reported on 04/18/2024 04/06/24   Ruthe Cornet, DO  metoprolol  succinate (TOPROL -XL) 100  MG 24 hr tablet TAKE 1 TABLET BY MOUTH ONCE DAILY TAKE  WITH  OR  IMNMEDIATELY  FOLLOWING  A  MEAL 06/10/19   Wendling, Mabel Mt, DO  Multiple Vitamin (MULTI-VITAMINS) TABS Take 1 tablet by mouth daily.    [provider]  oxyCODONE  (ROXICODONE ) 5 MG immediate release tablet Take 1 tablet (5 mg total) by mouth every 6 (six) hours as needed for up to 10 doses. 04/06/24   Curatolo, Adam, DO  potassium chloride SA (KLOR-CON M) 20 MEQ  tablet Take 1 tablet (20 mEq total) by mouth daily for 5 days. 05/14/24 05/19/24  Minnie Tinnie BRAVO, PA  pregabalin (LYRICA) 75 MG capsule Take 75 mg by mouth daily as needed (for pain).    [provider]  tiZANidine (ZANAFLEX) 4 MG capsule Take 4 mg by mouth daily as needed for muscle spasms.    [provider]    Allergies: Bee venom and Honey bee venom    Review of Systems  All other systems reviewed and are negative.   Updated Vital Signs BP (!) 146/71   Pulse 68   Temp 98.3 F (36.8 C) (Oral)   Resp 12   Ht 6' (1.829 m)   Wt 93.9 kg   SpO2 99%   BMI 28.07 kg/m   Physical Exam Vitals and nursing note reviewed.  Constitutional:      General: He is not in acute distress.    Appearance: He is well-developed.  HENT:     Head: Normocephalic and atraumatic.  Eyes:     Conjunctiva/sclera: Conjunctivae normal.  Cardiovascular:     Rate and Rhythm: Normal rate and regular rhythm.     Heart sounds: No murmur heard. Pulmonary:     Effort: Pulmonary effort is normal. No respiratory distress.     Breath sounds: Normal breath sounds.  Abdominal:     Palpations: Abdomen is soft.     Tenderness: There is no abdominal tenderness.  Musculoskeletal:        General: No swelling.     Cervical back: Neck supple.  Skin:    General: Skin is warm and dry.     Capillary Refill: Capillary refill takes less than 2 seconds.  Neurological:     Mental Status: He is alert.  Psychiatric:        Mood and Affect: Mood normal.     (all labs ordered are listed, but only abnormal results are displayed) Labs Reviewed  CBC WITH DIFFERENTIAL/PLATELET - Abnormal; Notable for the following components:      Result Value   WBC 3.2 (*)    RBC 6.30 (*)    Hemoglobin 17.5 (*)    HCT 53.2 (*)    Platelets 142 (*)    All other components within normal limits  COMPREHENSIVE METABOLIC PANEL WITH GFR - Abnormal; Notable for the following components:   Potassium 3.0 (*)    Glucose,  Bld 108 (*)    Calcium 10.9 (*)    Total Protein 6.4 (*)    Albumin 2.6 (*)    Alkaline Phosphatase 154 (*)    All other components within normal limits  TSH - Abnormal; Notable for the following components:   TSH 5.715 (*)    All other components within normal limits  BRAIN NATRIURETIC PEPTIDE - Abnormal; Notable for the following components:   B Natriuretic Peptide 145.2 (*)    All other components within normal limits  I-STAT CHEM 8, ED - Abnormal; Notable for the  following components:   Potassium 3.4 (*)    Glucose, Bld 108 (*)    Hemoglobin 9.9 (*)    HCT 29.0 (*)    All other components within normal limits  MAGNESIUM  T4, FREE    EKG: EKG Interpretation Date/Time:  Wednesday May 15 2024 11:27:59 EDT Ventricular Rate:  99 PR Interval:  171 QRS Duration:  90 QT Interval:  355 QTC Calculation: 456 R Axis:   76  Text Interpretation: Sinus rhythm Confirmed by Jerrol Agent ([id number]) on 05/15/2024 12:38:37 PM  Radiology: DG Chest Portable 1 View Result Date: 05/15/2024 CLINICAL DATA:  AFib. EXAM: PORTABLE CHEST 1 VIEW COMPARISON:  05/14/2024 FINDINGS: Right-sided PICC line unchanged. Lungs are adequately inflated. There is no acute airspace process or effusion. Cardiomediastinal silhouette and remainder of the exam is unchanged for IMPRESSION: No acute cardiopulmonary disease. Electronically Signed   By: Toribio Agreste M.D.   On: 05/15/2024 12:32   DG Chest Portable 1 View Result Date: 05/14/2024 CLINICAL DATA:  Bilateral ankle swelling, recent surgery, r/o effusion EXAM: PORTABLE CHEST - 1 VIEW COMPARISON:  08/30/2017 FINDINGS: Right PICC terminating in the mid SVC. Radiopaque metallic structure overlying the left upper lung zone, likely external to the patient. No focal airspace consolidation, pleural effusion, or pneumothorax. Mild cardiomegaly. Tortuous aorta with aortic atherosclerosis. Multilevel thoracic osteophytosis. IMPRESSION: 1. Mild cardiomegaly. No pneumonia or  pulmonary edema. 2. Right PICC terminates in the mid SVC. Electronically Signed   By: Rogelia Myers M.D.   On: 05/14/2024 18:36     Procedures   Medications Ordered in the ED  sodium chloride  0.9 % bolus 500 mL (0 mLs Intravenous Stopped 05/15/24 1505)  potassium chloride SA (KLOR-CON M) CR tablet 40 mEq (40 mEq Oral Given 05/15/24 1500)                                    Medical Decision Making Amount and/or Complexity of Data Reviewed Labs: ordered. Radiology: ordered.  Risk Prescription drug management.    73 year old male with medical history significant for cervical stenosis with osteomyelitis status post C5-C7 decompressive laminectomy with evacuation of cervical epidural abscess (on a PICC line with IV antibiotics) who presents to the emergency department with concern for elevated heart rate.  He has a history of paroxysmal atrial fibrillation and is on metoprolol  outpatient.  He is not currently on any anticoagulation.  He is receiving Rocephin  and daptomycin IV, last day of therapy is in the chart to be 05/31/2024.  Per EMS, the patient is coming from home.  He felt palpitations post spinal procedure and had been in atrial fibrillation and then self converted postoperatively.  He states that he went into atrial fibrillation last night.  He is on a ZIO monitor.  His heart rate was between the 130s and 140s on telemetry with EMS and then with EMS he spontaneously cardioverted.  He is now completely asymptomatic.  On arrival, the patient was afebrile, not tachycardic heart rate 99, mildly tachypneic RR 23, improved immediately, BP 160/70 which improved to 144/66 on recheck without intervention, saturating 99% on room air.  Position presents with atrial fibrillation, now spontaneously cardioverted with EMS.  Had been in A-fib all night.  Has a Zio patch in place.  He takes nightly metoprolol .    Initial EKG: Sinus rhythm, ventricular rate 9 9, no acute ischemic changes or abnormal  intervals.  Chest x-ray: IMPRESSION:  No acute cardiopulmonary disease.   Per cards notes outpatient: Anticoagulant: not currently on OAC for primary thromboembolism prevention 2/2 recent spinal surgery and low arrhythmia burden.  Labs: TSH mildly elevated but free T4 normal, BNP nonspecifically mildly elevated at 145, CBC with a leukopenia to 3.2, hemoglobin elevated at 17.5, CMP with mild hypokalemia to 3.0, replenished orally.  The patient was administered IV fluid bolus.  He remained stable in the emergency department, no further recurrence of atrial fibrillation.  He had had no chest pain throughout the night, do not think cardiac troponin testing is indicated at this time.  Patient was advised to continue taking his home outpatient metoprolol , continue with outpatient potassium supplementation in the setting of his outpatient Lasix use.  Patient overall stable for discharge at this time, return precautions provided.     Final diagnoses:  Atrial fibrillation, unspecified type [facility])  Hypokalemia    ED Discharge Orders     None          Jerrol Agent, MD 05/15/24 1521

## 2024-05-28 ENCOUNTER — Inpatient Hospital Stay (HOSPITAL_COMMUNITY)
Admission: EM | Admit: 2024-05-28 | Discharge: 2024-06-06 | DRG: 820 | Disposition: A | Discharge status: Home / Self Care | Attending: Family Medicine | Admitting: Family Medicine

## 2024-05-28 ENCOUNTER — Emergency Department (HOSPITAL_COMMUNITY)

## 2024-05-28 ENCOUNTER — Ambulatory Visit: Admitting: Internal Medicine

## 2024-05-28 ENCOUNTER — Inpatient Hospital Stay (HOSPITAL_COMMUNITY)

## 2024-05-28 ENCOUNTER — Encounter (HOSPITAL_COMMUNITY): Payer: Self-pay | Admitting: Pharmacy Technician

## 2024-05-28 ENCOUNTER — Other Ambulatory Visit: Payer: Self-pay

## 2024-05-28 DIAGNOSIS — Z8249 Family history of ischemic heart disease and other diseases of the circulatory system: Secondary | ICD-10-CM

## 2024-05-28 DIAGNOSIS — M4642 Discitis, unspecified, cervical region: Secondary | ICD-10-CM | POA: Diagnosis present

## 2024-05-28 DIAGNOSIS — I4891 Unspecified atrial fibrillation: Secondary | ICD-10-CM | POA: Diagnosis not present

## 2024-05-28 DIAGNOSIS — Z833 Family history of diabetes mellitus: Secondary | ICD-10-CM | POA: Diagnosis not present

## 2024-05-28 DIAGNOSIS — C8192 Hodgkin lymphoma, unspecified, intrathoracic lymph nodes: Secondary | ICD-10-CM | POA: Diagnosis not present

## 2024-05-28 DIAGNOSIS — J9859 Other diseases of mediastinum, not elsewhere classified: Secondary | ICD-10-CM

## 2024-05-28 DIAGNOSIS — C8590 Non-Hodgkin lymphoma, unspecified, unspecified site: Secondary | ICD-10-CM | POA: Diagnosis not present

## 2024-05-28 DIAGNOSIS — E43 Unspecified severe protein-calorie malnutrition: Secondary | ICD-10-CM | POA: Diagnosis present

## 2024-05-28 DIAGNOSIS — J95811 Postprocedural pneumothorax: Secondary | ICD-10-CM | POA: Diagnosis not present

## 2024-05-28 DIAGNOSIS — R339 Retention of urine, unspecified: Secondary | ICD-10-CM | POA: Diagnosis not present

## 2024-05-28 DIAGNOSIS — E875 Hyperkalemia: Secondary | ICD-10-CM | POA: Diagnosis present

## 2024-05-28 DIAGNOSIS — D630 Anemia in neoplastic disease: Secondary | ICD-10-CM | POA: Diagnosis present

## 2024-05-28 DIAGNOSIS — Z85828 Personal history of other malignant neoplasm of skin: Secondary | ICD-10-CM

## 2024-05-28 DIAGNOSIS — Z789 Other specified health status: Secondary | ICD-10-CM

## 2024-05-28 DIAGNOSIS — Z7901 Long term (current) use of anticoagulants: Secondary | ICD-10-CM | POA: Diagnosis not present

## 2024-05-28 DIAGNOSIS — M4640 Discitis, unspecified, site unspecified: Secondary | ICD-10-CM | POA: Diagnosis not present

## 2024-05-28 DIAGNOSIS — R64 Cachexia: Secondary | ICD-10-CM | POA: Diagnosis present

## 2024-05-28 DIAGNOSIS — Z91030 Bee allergy status: Secondary | ICD-10-CM | POA: Diagnosis not present

## 2024-05-28 DIAGNOSIS — I480 Paroxysmal atrial fibrillation: Secondary | ICD-10-CM | POA: Diagnosis present

## 2024-05-28 DIAGNOSIS — Z6828 Body mass index (BMI) 28.0-28.9, adult: Secondary | ICD-10-CM

## 2024-05-28 DIAGNOSIS — R39198 Other difficulties with micturition: Secondary | ICD-10-CM | POA: Insufficient documentation

## 2024-05-28 DIAGNOSIS — K219 Gastro-esophageal reflux disease without esophagitis: Secondary | ICD-10-CM | POA: Diagnosis present

## 2024-05-28 DIAGNOSIS — C8112 Nodular sclerosis classical Hodgkin lymphoma, intrathoracic lymph nodes: Secondary | ICD-10-CM | POA: Diagnosis present

## 2024-05-28 DIAGNOSIS — Z8549 Personal history of malignant neoplasm of other male genital organs: Secondary | ICD-10-CM

## 2024-05-28 DIAGNOSIS — I341 Nonrheumatic mitral (valve) prolapse: Secondary | ICD-10-CM | POA: Diagnosis present

## 2024-05-28 DIAGNOSIS — Z79899 Other long term (current) drug therapy: Secondary | ICD-10-CM

## 2024-05-28 DIAGNOSIS — I871 Compression of vein: Secondary | ICD-10-CM | POA: Diagnosis present

## 2024-05-28 DIAGNOSIS — Z8261 Family history of arthritis: Secondary | ICD-10-CM

## 2024-05-28 DIAGNOSIS — I10 Essential (primary) hypertension: Secondary | ICD-10-CM | POA: Diagnosis present

## 2024-05-28 DIAGNOSIS — D72829 Elevated white blood cell count, unspecified: Secondary | ICD-10-CM | POA: Diagnosis present

## 2024-05-28 DIAGNOSIS — R040 Epistaxis: Secondary | ICD-10-CM | POA: Diagnosis not present

## 2024-05-28 DIAGNOSIS — R130 Aphagia: Secondary | ICD-10-CM | POA: Diagnosis present

## 2024-05-28 DIAGNOSIS — E878 Other disorders of electrolyte and fluid balance, not elsewhere classified: Secondary | ICD-10-CM | POA: Diagnosis not present

## 2024-05-28 DIAGNOSIS — Z860100 Personal history of colon polyps, unspecified: Secondary | ICD-10-CM

## 2024-05-28 DIAGNOSIS — E876 Hypokalemia: Secondary | ICD-10-CM | POA: Diagnosis present

## 2024-05-28 DIAGNOSIS — D649 Anemia, unspecified: Secondary | ICD-10-CM

## 2024-05-28 DIAGNOSIS — R4701 Aphasia: Secondary | ICD-10-CM | POA: Diagnosis present

## 2024-05-28 DIAGNOSIS — Z8661 Personal history of infections of the central nervous system: Secondary | ICD-10-CM

## 2024-05-28 DIAGNOSIS — Z823 Family history of stroke: Secondary | ICD-10-CM

## 2024-05-28 LAB — COMPREHENSIVE METABOLIC PANEL WITH GFR
ALT: 17 U/L (ref 0–44)
AST: 18 U/L (ref 15–41)
Albumin: 3.2 g/dL — ABNORMAL LOW (ref 3.5–5.0)
Alkaline Phosphatase: 149 U/L — ABNORMAL HIGH (ref 38–126)
Anion gap: 12 (ref 5–15)
BUN: 16 mg/dL (ref 8–23)
CO2: 27 mmol/L (ref 22–32)
Calcium: 13.1 mg/dL (ref 8.9–10.3)
Chloride: 96 mmol/L — ABNORMAL LOW (ref 98–111)
Creatinine, Ser: 1 mg/dL (ref 0.61–1.24)
GFR, Estimated: >60 mL/min (ref >60)
Glucose, Bld: 125 mg/dL — ABNORMAL HIGH (ref 70–99)
Potassium: 3.7 mmol/L (ref 3.5–5.1)
Sodium: 135 mmol/L (ref 135–145)
Total Bilirubin: 0.5 mg/dL (ref 0.0–1.2)
Total Protein: 7 g/dL (ref 6.5–8.1)

## 2024-05-28 LAB — CBG MONITORING, ED: Glucose-Capillary: 120 mg/dL — ABNORMAL HIGH (ref 70–99)

## 2024-05-28 LAB — PROTIME-INR
INR: 1.2 (ref 0.8–1.2)
Prothrombin Time: 16 s — ABNORMAL HIGH (ref 11.4–15.2)

## 2024-05-28 LAB — CBC
HCT: 34.4 % — ABNORMAL LOW (ref 39.0–52.0)
Hemoglobin: 11.2 g/dL — ABNORMAL LOW (ref 13.0–17.0)
MCH: 27.9 pg (ref 26.0–34.0)
MCHC: 32.6 g/dL (ref 30.0–36.0)
MCV: 85.6 fL (ref 80.0–100.0)
Platelets: 270 K/uL (ref 150–400)
RBC: 4.02 MIL/uL — ABNORMAL LOW (ref 4.22–5.81)
RDW: 16.1 % — ABNORMAL HIGH (ref 11.5–15.5)
WBC: 9.3 K/uL (ref 4.0–10.5)
nRBC: 0 % (ref 0.0–0.2)

## 2024-05-28 LAB — I-STAT CHEM 8, ED
BUN: 18 mg/dL (ref 8–23)
Calcium, Ion: 1.76 mmol/L (ref 1.15–1.40)
Chloride: 99 mmol/L (ref 98–111)
Creatinine, Ser: 1.2 mg/dL (ref 0.61–1.24)
Glucose, Bld: 122 mg/dL — ABNORMAL HIGH (ref 70–99)
HCT: 33 % — ABNORMAL LOW (ref 39.0–52.0)
Hemoglobin: 11.2 g/dL — ABNORMAL LOW (ref 13.0–17.0)
Potassium: 3.7 mmol/L (ref 3.5–5.1)
Sodium: 136 mmol/L (ref 135–145)
TCO2: 31 mmol/L (ref 22–32)

## 2024-05-28 LAB — DIFFERENTIAL
Abs Immature Granulocytes: 0.04 K/uL (ref 0.00–0.07)
Basophils Absolute: 0 K/uL (ref 0.0–0.1)
Basophils Relative: 0 %
Eosinophils Absolute: 0.1 K/uL (ref 0.0–0.5)
Eosinophils Relative: 1 %
Immature Granulocytes: 0 %
Lymphocytes Relative: 20 %
Lymphs Abs: 1.9 K/uL (ref 0.7–4.0)
Monocytes Absolute: 1.1 K/uL — ABNORMAL HIGH (ref 0.1–1.0)
Monocytes Relative: 12 %
Neutro Abs: 6.1 K/uL (ref 1.7–7.7)
Neutrophils Relative %: 67 %

## 2024-05-28 LAB — APTT: aPTT: 33 s (ref 24–36)

## 2024-05-28 LAB — TROPONIN I (HIGH SENSITIVITY)
Troponin I (High Sensitivity): 5 ng/L (ref <18)
Troponin I (High Sensitivity): 6 ng/L (ref <18)

## 2024-05-28 LAB — ETHANOL: Alcohol, Ethyl (B): <15 mg/dL (ref <15)

## 2024-05-28 MED ORDER — LACTATED RINGERS IV SOLN: INTRAVENOUS | Status: AC

## 2024-05-28 MED ORDER — IOHEXOL 350 MG/ML SOLN
75.0000 mL | Freq: Once | INTRAVENOUS | Status: AC | PRN
  Administered 2024-05-28: 75 mL via INTRAVENOUS

## 2024-05-28 MED ORDER — SENNA 8.6 MG PO TABS
1.0000 | ORAL_TABLET | Freq: Every day | ORAL | Status: DC
  Administered 2024-05-28 – 2024-05-29 (×2): 8.6 mg via ORAL
  Filled 2024-05-28 (×2): qty 1

## 2024-05-28 MED ORDER — ENOXAPARIN SODIUM 40 MG/0.4ML IJ SOSY
40.0000 mg | PREFILLED_SYRINGE | INTRAMUSCULAR | Status: DC
  Administered 2024-05-28: 40 mg via SUBCUTANEOUS
  Filled 2024-05-28: qty 0.4

## 2024-05-28 MED ORDER — MORPHINE SULFATE (PF) 4 MG/ML IV SOLN
4.0000 mg | Freq: Once | INTRAVENOUS | Status: AC
  Administered 2024-05-28: 4 mg via INTRAVENOUS
  Filled 2024-05-28: qty 1

## 2024-05-28 MED ORDER — ENSURE PLUS HIGH PROTEIN PO LIQD
237.0000 mL | Freq: Two times a day (BID) | ORAL | Status: DC
  Administered 2024-05-29 – 2024-06-05 (×12): 237 mL via ORAL

## 2024-05-28 MED ORDER — ZOLEDRONIC ACID 5 MG/100ML IV SOLN: 4.0000 mg | Freq: Once | INTRAVENOUS | Status: DC

## 2024-05-28 MED ORDER — SODIUM CHLORIDE 0.9% FLUSH
3.0000 mL | Freq: Once | INTRAVENOUS | Status: AC
  Administered 2024-05-28: 3 mL via INTRAVENOUS

## 2024-05-28 MED ORDER — METOPROLOL SUCCINATE ER 50 MG PO TB24
50.0000 mg | ORAL_TABLET | Freq: Every day | ORAL | Status: DC
  Administered 2024-05-28 – 2024-05-30 (×3): 50 mg via ORAL
  Filled 2024-05-28 (×4): qty 1

## 2024-05-28 MED ORDER — TIZANIDINE HCL 4 MG PO TABS
4.0000 mg | ORAL_TABLET | Freq: Three times a day (TID) | ORAL | Status: DC | PRN
  Administered 2024-05-28 – 2024-06-01 (×2): 4 mg via ORAL
  Filled 2024-05-28 (×2): qty 1

## 2024-05-28 MED ORDER — SODIUM CHLORIDE 0.9 % IV BOLUS
2000.0000 mL | Freq: Once | INTRAVENOUS | Status: AC
  Administered 2024-05-28: 2000 mL via INTRAVENOUS

## 2024-05-28 MED ORDER — ACETAMINOPHEN 325 MG PO TABS: 650.0000 mg | ORAL_TABLET | Freq: Four times a day (QID) | ORAL | Status: DC | PRN

## 2024-05-28 MED ORDER — POLYETHYLENE GLYCOL 3350 17 G PO PACK
17.0000 g | PACK | Freq: Every day | ORAL | Status: DC | PRN
  Filled 2024-05-28: qty 1

## 2024-05-28 MED ORDER — CALCITONIN (SALMON) 200 UNIT/ML IJ SOLN
4.0000 [IU]/kg | Freq: Two times a day (BID) | INTRAMUSCULAR | Status: DC
  Administered 2024-05-28 – 2024-05-29 (×2): 376 [IU] via SUBCUTANEOUS
  Filled 2024-05-28 (×3): qty 1.88

## 2024-05-28 MED ORDER — ZOLEDRONIC ACID 4 MG/100ML IV SOLN
4.0000 mg | Freq: Once | INTRAVENOUS | Status: DC
  Filled 2024-05-28: qty 100

## 2024-05-28 MED ORDER — TRAMADOL HCL 50 MG PO TABS
50.0000 mg | ORAL_TABLET | Freq: Every day | ORAL | Status: DC | PRN
  Administered 2024-05-28: 50 mg via ORAL
  Filled 2024-05-28: qty 1

## 2024-05-28 MED ORDER — CHLORHEXIDINE GLUCONATE CLOTH 2 % EX PADS
6.0000 | MEDICATED_PAD | Freq: Every day | CUTANEOUS | Status: DC
Start: 2024-05-29 — End: 2024-06-05
  Administered 2024-05-29 – 2024-06-04 (×7): 6 via TOPICAL

## 2024-05-28 MED ORDER — LACTATED RINGERS IV BOLUS
1000.0000 mL | Freq: Once | INTRAVENOUS | Status: AC
  Administered 2024-05-28: 1000 mL via INTRAVENOUS

## 2024-05-28 MED ORDER — ZOLEDRONIC ACID 4 MG/100ML IV SOLN
4.0000 mg | Freq: Once | INTRAVENOUS | Status: AC
  Administered 2024-05-28: 4 mg via INTRAVENOUS
  Filled 2024-05-28: qty 100

## 2024-05-28 NOTE — ED Provider Notes (Signed)
  Physical Exam  BP (!) 165/77   Pulse 69   Temp 97.8 F (36.6 C)   Resp 20   SpO2 96%   Physical Exam  Procedures  Procedures  ED Course / MDM    Medical Decision Making Care assumed at 3 PM.  Patient is here with trouble speaking.  Signed out pending CTA head and neck and MRI brain to rule out stroke  5:34 PM CTA head and neck did not show any LVO.  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  Patient's calcium is elevated at 13 which can cause the symptoms.  MRI did not show a stroke.  Patient was given 2 L normal saline bolus.  Patient will be admitted for hypercalcemia  Problems Addressed: Hypercalcemia: acute illness or injury Mediastinal mass: acute illness or injury  Amount and/or Complexity of Data Reviewed Labs: ordered. Decision-making details documented in ED Course. Radiology: ordered and independent interpretation performed. Decision-making details documented in ED Course.  Risk Prescription drug management. Decision regarding hospitalization.    CRITICAL CARE Performed by: Alm VEAR Cave   Total critical care time: 39 minutes  Critical care time was exclusive of separately billable procedures and treating other patients.  Critical care was necessary to treat or prevent imminent or life-threatening deterioration.  Critical care was time spent personally by me on the following activities: development of treatment plan with patient and/or surrogate as well as nursing, discussions with consultants, evaluation of patient's response to treatment, examination of patient, obtaining history from patient or surrogate, ordering and performing treatments and interventions, ordering and review of laboratory studies, ordering and review of radiographic studies, pulse oximetry and re-evaluation of patient's condition.       Cave Alm Macho, MD 05/28/24 [phone]

## 2024-05-28 NOTE — Assessment & Plan Note (Signed)
-   Admit to FMTS, attending Dr. Delores  - Telemetry, Vital signs per floor - Consults: Heme/Onc, CT surgery (11/5) - CT AP w contrast ordered  - Fluids: ordering 1 L LR bolus followed by LR maintenance 125 mL/hr  - Labs: uric acid, LDH, PTH-RP - Recheck BMP at 2000 to monitor hypercalcemia  - Calcitonin 376 units SubQ BID for 4 doses  - Zolidronic acid 4 mg x1  - Fall precautions - RD consult for malnutrition  - Weigh patient

## 2024-05-28 NOTE — Hospital Course (Addendum)
 David [name] is a 73 y.o.male with a history of penile ca, discitis who was admitted to the [facility] at [facility] for Hypercalcemia.  Found to have mediastinal mass during admission.  His hospital course is detailed below:  Hypercalcemia, resolved Mediastinal Mass Presented with new onset aphasia and generalized weakness found to have hypercalcemia with calcium of 13.1 on admission.  Hypercalcemia treated with fluids, calcitonin, zoledronic acid with improvement.  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  Thoracic surgery was consulted, who performed biopsy of mass on 11/6.  Pathology results are pending at time of discharge.  Heme-onc was also consulted, who recommended outpatient follow-up for further management.  Urinary retention Post biopsy, patient experienced urinary retention with failed bladder scan x 3 and Foley was placed on 11/7.  Also started on Flomax 0.4 mg daily.  At family's request given patient with more confusion from baseline per family report, UA was collected 11/8 without strong evidence of infection.  No further antibiotics were given.  Patient had foley placed on 11/9 which was removed prior to discharge with bladder scan. Patient was discharged with an appt with [facility] for follow up s/p foley removal.   Severe malnutrition with Refeeding Syndrome Patient with decreased appetite and unintentional weight loss.  RD consulted this admission.  Patient supplementing with Ensure between meals, also started on Remeron 15 mg daily at bedtime with improvement in appetite.  Monitored for refeeding, and electrolytes repleted as indicated.   Atrial Fibrillation with RVR Patient experienced multiple episodes of tachycardia causing lightheadedness once he started moving around and eating more. Patient's home  metoprolol  was increased from 50 mg to 100 mg with slight improvement. Cardiology was consulted and set up an outpatient appointment for 11/17. At time of discharge, patient was asymptomatic and did not have any sustained episodes of tachycardia.   Other chronic conditions were medically managed with home medications and formulary alternatives as necessary (discitis, PAF, reflux)  PCP Follow-up Recommendations: Ensure follow-up with outpatient oncology Ensure follow-up with outpatient urology  Ensure follow-up with outpatient cardiothoracic surgery Ensure follow-up with cardiology Follow up on electrolytes

## 2024-05-28 NOTE — ED Provider Notes (Signed)
 [place] EMERGENCY DEPARTMENT AT [facility] Provider Note   CSN: [id number] Arrival date & time: 05/28/24  1331     Patient presents with: Weakness and Aphasia   David [name] is a 73 y.o. male.    Weakness Patient presented with generalized weakness and then aphasia.  For the last day has had some weakness but speech happened today.  States nonsense words were coming out of his mouth.  States they were right in his head.  Family with him said that the patient seemed frustrated with it.  Was able to move all extremities during the episode.  Has PICC line on right arm for previous epidural abscess postsurgery.  Although worrisome for potential stroke, not a TNK candidate due to recent spinal surgery.    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 Authorizing Provider  cefTRIAXone  (ROCEPHIN ) IVPB Inject 2 g into the vein daily. Indication:  cervical osteomyelitis First Dose: Yes Last Day of Therapy:  05/31/24 Labs - Once weekly:  CBC/D and BMP, Labs - Once weekly: ESR and CRP Method of administration: IV Push Method of administration may be changed at the discretion of home infusion pharmacist based upon assessment of the patient and/or caregiver's ability to self-administer the medication ordered. 04/24/24 06/02/24  Louis Shove, MD  Cod Liver Oil CAPS Take 1 capsule by mouth every evening.    [provider]  Coenzyme Q10 100 MG capsule Take 100 mg by mouth daily.    [provider]  daptomycin (CUBICIN) IVPB Inject 700 mg into the vein daily. Indication:  cervical osteomyelitis First Dose: Yes Last Day of Therapy:  05/31/24 Labs - Once weekly:  CBC/D, BMP, and CPK Labs - Once weekly: ESR and CRP Method of administration: IV Push Method of administration may be  changed at the discretion of home infusion pharmacist based upon assessment of the patient and/or caregiver's ability to self-administer the medication ordered. 04/24/24 06/02/24  Louis Shove, MD  diclofenac (VOLTAREN) 75 MG EC tablet Take 75 mg by mouth daily as needed for mild pain (pain score 1-3).    [provider]  EPINEPHrine 0.3 mg/0.3 mL IJ SOAJ injection Inject into the skin. 03/09/14   [provider]  furosemide (LASIX) 20 MG tablet Take 1 tablet (20 mg total) by mouth daily for 5 days. 05/14/24 05/19/24  Minnie Tinnie BRAVO, PA  gabapentin  (NEURONTIN ) 300 MG capsule Take 1 capsule (300 mg total) by mouth 3 (three) times daily. Patient not taking: Reported on 05/10/2024 08/19/18   Loetta Senior, MD  Garlic 100 MG TABS Take 100 mg by mouth daily at 6 (six) AM.    [provider]  Ginger, Zingiber officinalis, (GINGER ROOT PO) Take 1 tablet by mouth 2 (two) times daily.    [provider]  L-Glutamine 500 MG CAPS Take 1 capsule by mouth 2 (two) times daily.    [provider]  methylPREDNISolone  (MEDROL  DOSEPAK) 4 MG TBPK tablet Follow package insert Patient not taking: Reported on 04/18/2024 04/06/24   Ruthe Cornet, DO  metoprolol  succinate (TOPROL -XL) 100 MG 24 hr tablet TAKE 1 TABLET BY MOUTH ONCE DAILY TAKE  WITH  OR  IMNMEDIATELY  FOLLOWING  A  MEAL 06/10/19   Frann Netter  Deward, DO  Multiple Vitamin (MULTI-VITAMINS) TABS Take 1 tablet by mouth daily.    [provider]  oxyCODONE  (ROXICODONE ) 5 MG immediate release tablet Take 1 tablet (5 mg total) by mouth every 6 (six) hours as needed for up to 10 doses. 04/06/24   Curatolo, Adam, DO  potassium chloride SA (KLOR-CON M) 20 MEQ tablet Take 1 tablet (20 mEq total) by mouth daily for 5 days. 05/14/24 05/19/24  Minnie Tinnie BRAVO, PA  pregabalin (LYRICA) 75 MG capsule Take 75 mg by mouth daily as needed (for pain).    [provider]  tiZANidine (ZANAFLEX) 4 MG capsule Take 4 mg by  mouth daily as needed for muscle spasms.    [provider]    Allergies: Bee venom and Honey bee venom    Review of Systems  Neurological:  Positive for weakness.    Updated Vital Signs BP (!) 165/77   Pulse 69   Temp 97.8 F (36.6 C)   Resp 20   SpO2 96%   Physical Exam Vitals and nursing note reviewed.  Cardiovascular:     Rate and Rhythm: Regular rhythm.  Pulmonary:     Breath sounds: No wheezing.  Abdominal:     Tenderness: There is no abdominal tenderness.  Neurological:     Mental Status: He is alert.     Comments: Awake and pleasant.  Moves all extremities.  Appears to have equal grip strength bilaterally.  Normal speech at this time.     (all labs ordered are listed, but only abnormal results are displayed) Labs Reviewed  PROTIME-INR - Abnormal; Notable for the following components:      Result Value   Prothrombin Time 16.0 (*)    All other components within normal limits  CBC - Abnormal; Notable for the following components:   RBC 4.02 (*)    Hemoglobin 11.2 (*)    HCT 34.4 (*)    RDW 16.1 (*)    All other components within normal limits  DIFFERENTIAL - Abnormal; Notable for the following components:   Monocytes Absolute 1.1 (*)    All other components within normal limits  COMPREHENSIVE METABOLIC PANEL WITH GFR - Abnormal; Notable for the following components:   Chloride 96 (*)    Glucose, Bld 125 (*)    Calcium 13.1 (*)    Albumin 3.2 (*)    Alkaline Phosphatase 149 (*)    All other components within normal limits  CBG MONITORING, ED - Abnormal; Notable for the following components:   Glucose-Capillary 120 (*)    All other components within normal limits  I-STAT CHEM 8, ED - Abnormal; Notable for the following components:   Glucose, Bld 122 (*)    Calcium, Ion 1.76 (*)    Hemoglobin 11.2 (*)    HCT 33.0 (*)    All other components within normal limits  APTT  ETHANOL  CBG MONITORING, ED  TROPONIN I (HIGH SENSITIVITY)  TROPONIN I  (HIGH SENSITIVITY)    EKG: None  Radiology: No results found.   Procedures   Medications Ordered in the ED  sodium chloride  0.9 % bolus 2,000 mL (has no administration in time range)  sodium chloride  flush (NS) 0.9 % injection 3 mL (3 mLs Intravenous Given 05/28/24 1402)                                    Medical Decision Making Amount  and/or Complexity of Data Reviewed Labs: ordered. Radiology: ordered.   Patient with history of A-fib and epidural abscess.  Not on anticoagulation.  However did have weakness but also aphasia.  Symptoms improved now.  Not a TNK candidate due to time of onset.  Reviewed previous neurosurgery note.  Head CT/CT angiography of with done.  Does have  hypercalcemia which could cause some of the symptoms.   Will likely require admission to hospital.  Care turned over to Dr.Yao.     Final diagnoses:  Hyperkalemia  Hypercalcemia    ED Discharge Orders     None          Patsey Lot, MD 05/28/24 [phone]

## 2024-05-28 NOTE — H&P (Signed)
 Hospital Admission History and Physical Service Pager: [phone]  Patient name: David [name] Medical record number: [id number] Date of Birth: [birth year] Age: 73 y.o. Gender: male  Primary Care Provider: Health, [address]  Consultants: Heme/Onc Code Status: Full code which was confirmed with family if patient unable to confirm   Preferred Emergency Contact:  Contact Information     Name Relation Home Work Mobile   [name] Daughter [phone]  [phone]      Other Contacts   None on File      Chief Complaint: Generalized Weakness   Differential and Medical Decision Making:  David [name] is a 73 y.o. male presenting with new onset aphagia and generalized weakness found to have new mediastinal mass with hypercalcemia.   Concern for malignancy with hypercalcemia, new large anterior mediastinal mass and decreased appetite associated with unintentional weight loss. Continuing further workup as below with heme/onc consulted to help with management.   With transient aphagia, known paroxsymal a fib not on anticoagulation, and now a potential malignancy; he is very high risk for neurovascular event. Will need to discuss starting anticoagulation s/p CT surgery evaluation for biopsy of mass. Fortunately neurological symptoms have resolved and MRI brain is negative.   Assessment & Plan Hypercalcemia Mediastinal mass - Admit to FMTS, attending Dr. Delores  - Telemetry, Vital signs per floor - Consults: Heme/Onc, CT surgery (11/5) - CT AP w contrast ordered  - Fluids: ordering 1 L LR bolus followed by LR maintenance 125 mL/hr  - Labs: uric acid, LDH, PTH-RP - Recheck BMP at 2000 to monitor hypercalcemia  - Calcitonin 376 units SubQ BID for 4 doses  - Zolidronic acid 4 mg x1  - Fall precautions - RD consult for malnutrition  - Weigh patient  Discitis of cervical region Completed IV antibiotics outpatient, no leukocytosis and afebrile  PICC line still present - Reorder home  tramadol for pain control  - Miralax and Sena on for BM  Transient Aphagia Paroxysmal atrial fibrillation (HCC) Noted on long term monitoring outpatient recently. Scheduled for follow up with cards later this month to discuss management.  - continuous tele  - discuss anticoagulation to reduce risk of stroke  - AM lipid and A1c     FEN/GI: Regular  VTE Prophylaxis: Lovenox   Disposition: Telemetry   History of Present Illness:  David [name] is a 73 y.o. male presenting with generalized weakness and worsening aphasia for the last 72 hours. Found to have difficulty with word finding that progressed until ED presentation. History of a fib not on anticoagulation.   Recent history of epidural abscess with cervical discitis followed by [facility]. He completed outpatient course of IV antibiotics and was scheduled to have PICC removed today before aphagia worsened.   History of squamous cell cancer of the penis in 02/2022. Appeared to be uncomplicated with no recurrence. Having regular bowel movements, denies urinary retention.   In the ED, had stroke workup that showed negative MRI brain. CTA head and neck showed large mediastinal mass concerning for malignancy. Calcium was found to be 13.1. Aphagia resolved at time of ED presentation. FMTS called for admission.   Review Of Systems: Per HPI with the following additions: as above  Pertinent Past Medical History: Penile cancer 2023 Discitis  Remainder reviewed in history tab.   Pertinent Past Surgical History: Appendectomy  Foraminotomy Spine C1 03/2024  Remainder reviewed in history tab.   Pertinent Social History: Tobacco use: Denies  Alcohol use: denies  Other Substance use: denies  Lives with daughter   Pertinent Family History: Non-contributing   Important Outpatient Medications: Tramadol 50 mg daily  Tizanidine 4 mg  Metoprolol  100 mg daily  Gabapentin  300 mg  Oxycodone  5 mg    Objective: BP (!) 165/77    Pulse 69   Temp 97.8 F (36.6 C)   Resp 20   SpO2 96%  Exam: General: Chronically ill-appearing, no acute distress, cachetic  Cardio: RRR, no murmur on exam Pulm: clear, No increased work of breathing Abdomen: Soft, bowel sounds present, nontender Extremity: No peripheral edema, peripheral pulses +2 bilaterally  Neuro: CN II: PERRL CN III, IV,VI: EOMI CV V: Normal sensation in V1, V2, V3 CVII: Symmetric smile and brow raise CN VIII: Normal hearing CN IX,X: Symmetric palate raise  CN XI: 5/5 shoulder shrug CN XII: Symmetric tongue protrusion  UE and LE strength 5/5 2+ UE and LE reflexes  Normal sensation in UE and LE bilaterally   Labs:  CBC BMET  Recent Labs  Lab 05/28/24 1350 05/28/24 1432  WBC 9.3  --   HGB 11.2* 11.2*  HCT 34.4* 33.0*  PLT 270  --    Recent Labs  Lab 05/28/24 1350 05/28/24 1432  NA 135 136  K 3.7 3.7  CL 96* 99  CO2 27  --   BUN 16 18  CREATININE 1.00 1.20  GLUCOSE 125* 122*  CALCIUM 13.1*  --     Pertinent additional labs alcohol <15.   EKG: NSR, appears to have normal and regular p-wave interval, no ST changes, Qtc 429  Imaging Studies Performed:  CTA Head and Neck:  IMPRESSION: 1. No carotid artery stenosis 2. No significant vertebrobasilar or intracranial disease 3. 4.7 x 9.4 x 11 cm anterior mediastinal mass  MRI Brain:  IMPRESSION: 1. Largely unremarkable appearance of the brain for age. No acute intracranial abnormality.   Cleotilde Perkins, DO 05/28/2024, 5:30 PM PGY-3, [facility]  FPTS Intern pager: [phone], text pages welcome Secure chat group [facility] Teaching Service

## 2024-05-28 NOTE — Assessment & Plan Note (Signed)
 Noted on long term monitoring outpatient recently. Scheduled for follow up with cards later this month to discuss management.  - continuous tele  - discuss anticoagulation to reduce risk of stroke  - AM lipid and A1c

## 2024-05-28 NOTE — Assessment & Plan Note (Signed)
 Completed IV antibiotics outpatient, no leukocytosis and afebrile  PICC line still present - Reorder home tramadol for pain control  - Miralax and Sena on for BM

## 2024-05-28 NOTE — Plan of Care (Signed)
 FMTS Brief Progress Note  S: Went to evaluate patient at bedside with Dr. Lupie.  Daughter at bedside.  Discussed CT abdomen with evidence of metastatic disease.  Informed patient and daughter that oncology will be seeing them tomorrow, and we will work on plan for biopsy. Gave space for patient and daughter to process news of mediastinal mass with suspected metastatic disease.  Answered questions.  O: BP (!) 159/69 (BP Location: Right Arm)   Pulse 74   Temp 98 F (36.7 C)   Resp 17   SpO2 98%    General: NAD, pleasant Respiratory: No respiratory distress, breathing comfortably, able to speak in full sentences Skin: warm and dry, no rashes noted on exposed skin  A/P: -PT/OT eval and treat placed -Ordered tizanidine, which is a home medication for patient uses for muscle spasms-particularly with his elevated calcium believe this is reasonable -Oncology will see tomorrow -Rest of plan as outlined by H&P from day team - Orders reviewed. Labs for AM ordered, which was adjusted as needed.  - If condition changes, plan includes page family medicine teaching service.   Howell Lunger, DO 05/28/2024, 9:32 PM PGY-3, [facility] Night Resident  Please page [phone] with questions.

## 2024-05-28 NOTE — ED Triage Notes (Signed)
 Pt bib [facility] from home where daughter called with concern for generalized weakness and worsening aphasia for the last 27 hours. Pt also had a headache earlier today. On scene, ems reports pt with 5 min of chest pain that resolved without intervention. Pt in and out of afib, not on anticoags. VSS with ems.

## 2024-05-29 ENCOUNTER — Inpatient Hospital Stay (HOSPITAL_COMMUNITY): Admitting: Certified Registered Nurse Anesthetist

## 2024-05-29 ENCOUNTER — Encounter (HOSPITAL_COMMUNITY)
Admission: EM | Disposition: A | Discharge status: Home Health | Payer: Self-pay | Source: Home / Self Care | Attending: Family Medicine

## 2024-05-29 DIAGNOSIS — J9859 Other diseases of mediastinum, not elsewhere classified: Secondary | ICD-10-CM

## 2024-05-29 DIAGNOSIS — D649 Anemia, unspecified: Secondary | ICD-10-CM

## 2024-05-29 DIAGNOSIS — R4701 Aphasia: Secondary | ICD-10-CM | POA: Diagnosis not present

## 2024-05-29 DIAGNOSIS — C8590 Non-Hodgkin lymphoma, unspecified, unspecified site: Secondary | ICD-10-CM

## 2024-05-29 DIAGNOSIS — M4642 Discitis, unspecified, cervical region: Secondary | ICD-10-CM | POA: Diagnosis not present

## 2024-05-29 DIAGNOSIS — I871 Compression of vein: Secondary | ICD-10-CM

## 2024-05-29 DIAGNOSIS — I341 Nonrheumatic mitral (valve) prolapse: Secondary | ICD-10-CM

## 2024-05-29 DIAGNOSIS — I480 Paroxysmal atrial fibrillation: Secondary | ICD-10-CM

## 2024-05-29 LAB — CBC
HCT: 31.1 % — ABNORMAL LOW (ref 39.0–52.0)
HCT: 30.4 % — ABNORMAL LOW (ref 39.0–52.0)
Hemoglobin: 9.9 g/dL — ABNORMAL LOW (ref 13.0–17.0)
Hemoglobin: 9.6 g/dL — ABNORMAL LOW (ref 13.0–17.0)
MCH: 27.7 pg (ref 26.0–34.0)
MCH: 27.9 pg (ref 26.0–34.0)
MCHC: 31.8 g/dL (ref 30.0–36.0)
MCHC: 31.6 g/dL (ref 30.0–36.0)
MCV: 87.1 fL (ref 80.0–100.0)
MCV: 88.4 fL (ref 80.0–100.0)
Platelets: 249 K/uL (ref 150–400)
Platelets: 239 K/uL (ref 150–400)
RBC: 3.57 MIL/uL — ABNORMAL LOW (ref 4.22–5.81)
RBC: 3.44 MIL/uL — ABNORMAL LOW (ref 4.22–5.81)
RDW: 15.9 % — ABNORMAL HIGH (ref 11.5–15.5)
RDW: 16.1 % — ABNORMAL HIGH (ref 11.5–15.5)
WBC: 8.7 K/uL (ref 4.0–10.5)
WBC: 9.5 K/uL (ref 4.0–10.5)
nRBC: 0 % (ref 0.0–0.2)
nRBC: 0 % (ref 0.0–0.2)

## 2024-05-29 LAB — COMPREHENSIVE METABOLIC PANEL WITH GFR
ALT: 14 U/L (ref 0–44)
ALT: 14 U/L (ref 0–44)
AST: 15 U/L (ref 15–41)
AST: 17 U/L (ref 15–41)
Albumin: 2.9 g/dL — ABNORMAL LOW (ref 3.5–5.0)
Albumin: 2.8 g/dL — ABNORMAL LOW (ref 3.5–5.0)
Alkaline Phosphatase: 133 U/L — ABNORMAL HIGH (ref 38–126)
Alkaline Phosphatase: 134 U/L — ABNORMAL HIGH (ref 38–126)
Anion gap: 9 (ref 5–15)
Anion gap: 11 (ref 5–15)
BUN: 12 mg/dL (ref 8–23)
BUN: 12 mg/dL (ref 8–23)
CO2: 27 mmol/L (ref 22–32)
CO2: 26 mmol/L (ref 22–32)
Calcium: 11.1 mg/dL — ABNORMAL HIGH (ref 8.9–10.3)
Calcium: 10.4 mg/dL — ABNORMAL HIGH (ref 8.9–10.3)
Chloride: 100 mmol/L (ref 98–111)
Chloride: 101 mmol/L (ref 98–111)
Creatinine, Ser: 0.89 mg/dL (ref 0.61–1.24)
Creatinine, Ser: 1 mg/dL (ref 0.61–1.24)
GFR, Estimated: >60 mL/min (ref >60)
GFR, Estimated: >60 mL/min (ref >60)
Glucose, Bld: 123 mg/dL — ABNORMAL HIGH (ref 70–99)
Glucose, Bld: 116 mg/dL — ABNORMAL HIGH (ref 70–99)
Potassium: 3.5 mmol/L (ref 3.5–5.1)
Potassium: 3.6 mmol/L (ref 3.5–5.1)
Sodium: 136 mmol/L (ref 135–145)
Sodium: 138 mmol/L (ref 135–145)
Total Bilirubin: 0.6 mg/dL (ref 0.0–1.2)
Total Bilirubin: 0.5 mg/dL (ref 0.0–1.2)
Total Protein: 6.4 g/dL — ABNORMAL LOW (ref 6.5–8.1)
Total Protein: 6.4 g/dL — ABNORMAL LOW (ref 6.5–8.1)

## 2024-05-29 LAB — BASIC METABOLIC PANEL WITH GFR
Anion gap: 10 (ref 5–15)
BUN: 13 mg/dL (ref 8–23)
CO2: 25 mmol/L (ref 22–32)
Calcium: 11.4 mg/dL — ABNORMAL HIGH (ref 8.9–10.3)
Chloride: 99 mmol/L (ref 98–111)
Creatinine, Ser: 0.91 mg/dL (ref 0.61–1.24)
GFR, Estimated: >60 mL/min (ref >60)
Glucose, Bld: 124 mg/dL — ABNORMAL HIGH (ref 70–99)
Potassium: 3.5 mmol/L (ref 3.5–5.1)
Sodium: 134 mmol/L — ABNORMAL LOW (ref 135–145)

## 2024-05-29 LAB — LIPID PANEL
Cholesterol: 168 mg/dL (ref 0–200)
HDL: 27 mg/dL — ABNORMAL LOW (ref >40)
LDL Cholesterol: 125 mg/dL — ABNORMAL HIGH (ref 0–99)
Total CHOL/HDL Ratio: 6.2 ratio
Triglycerides: 78 mg/dL (ref <150)
VLDL: 16 mg/dL (ref 0–40)

## 2024-05-29 LAB — ABO/RH: ABO/RH(D): B POS

## 2024-05-29 LAB — PROTIME-INR
INR: 1.3 — ABNORMAL HIGH (ref 0.8–1.2)
Prothrombin Time: 16.7 s — ABNORMAL HIGH (ref 11.4–15.2)

## 2024-05-29 LAB — VITAMIN D 25 HYDROXY (VIT D DEFICIENCY, FRACTURES): Vit D, 25-Hydroxy: 32.61 ng/mL (ref 30–100)

## 2024-05-29 LAB — URIC ACID: Uric Acid, Serum: 5 mg/dL (ref 3.7–8.6)

## 2024-05-29 LAB — LACTATE DEHYDROGENASE: LDH: 159 U/L (ref 98–192)

## 2024-05-29 LAB — HEMOGLOBIN A1C
Hgb A1c MFr Bld: 4.9 % (ref 4.8–5.6)
Mean Plasma Glucose: 93.93 mg/dL

## 2024-05-29 LAB — HEPARIN LEVEL (UNFRACTIONATED): Heparin Unfractionated: <0.1 [IU]/mL — ABNORMAL LOW (ref 0.30–0.70)

## 2024-05-29 SURGERY — VIDEO ASSISTED THORACOSCOPY (VATS)/ LOBECTOMY
Anesthesia: General | Site: Chest

## 2024-05-29 MED ORDER — MIDAZOLAM HCL 2 MG/2ML IJ SOLN
INTRAMUSCULAR | Status: AC
  Filled 2024-05-29: qty 2

## 2024-05-29 MED ORDER — LACTATED RINGERS IV SOLN: INTRAVENOUS | Status: DC

## 2024-05-29 MED ORDER — CHLORHEXIDINE GLUCONATE 0.12 % MT SOLN: 15.0000 mL | Freq: Once | OROMUCOSAL | Status: DC

## 2024-05-29 MED ORDER — SODIUM CHLORIDE (PF) 0.9 % IJ SOLN
INTRAMUSCULAR | Status: AC
Start: 2024-05-29 — End: 2024-05-29
  Filled 2024-05-29: qty 50

## 2024-05-29 MED ORDER — DAPTOMYCIN-SODIUM CHLORIDE 700-0.9 MG/100ML-% IV SOLN
700.0000 mg | Freq: Every day | INTRAVENOUS | Status: AC
  Administered 2024-05-29 – 2024-05-31 (×2): 700 mg via INTRAVENOUS
  Filled 2024-05-29 (×3): qty 100

## 2024-05-29 MED ORDER — BUPIVACAINE LIPOSOME 1.3 % IJ SUSP
INTRAMUSCULAR | Status: AC
  Filled 2024-05-29: qty 20

## 2024-05-29 MED ORDER — CEFAZOLIN SODIUM-DEXTROSE 2-4 GM/100ML-% IV SOLN
2.0000 g | INTRAVENOUS | Status: AC
  Administered 2024-05-30: 2 g via INTRAVENOUS
  Filled 2024-05-29: qty 100

## 2024-05-29 MED ORDER — SODIUM CHLORIDE 0.9% IV SOLUTION: Freq: Once | INTRAVENOUS | Status: AC

## 2024-05-29 MED ORDER — SODIUM CHLORIDE 0.9 % IV SOLN
2.0000 g | Freq: Every day | INTRAVENOUS | Status: AC
  Administered 2024-05-29 – 2024-05-31 (×3): 2 g via INTRAVENOUS
  Filled 2024-05-29 (×3): qty 20

## 2024-05-29 MED ORDER — CHLORHEXIDINE GLUCONATE 0.12 % MT SOLN
OROMUCOSAL | Status: AC
  Filled 2024-05-29: qty 15

## 2024-05-29 MED ORDER — HEPARIN BOLUS VIA INFUSION
4000.0000 [IU] | Freq: Once | INTRAVENOUS | Status: AC
  Administered 2024-05-29: 4000 [IU] via INTRAVENOUS

## 2024-05-29 MED ORDER — BUPIVACAINE HCL (PF) 0.5 % IJ SOLN
INTRAMUSCULAR | Status: AC
  Filled 2024-05-29: qty 30

## 2024-05-29 MED ORDER — PROPOFOL 10 MG/ML IV BOLUS
INTRAVENOUS | Status: AC
  Filled 2024-05-29: qty 20

## 2024-05-29 MED ORDER — HEPARIN (PORCINE) 25000 UT/250ML-% IV SOLN
1400.0000 [IU]/h | INTRAVENOUS | Status: DC
  Administered 2024-05-29: 1400 [IU]/h via INTRAVENOUS
  Filled 2024-05-29 (×2): qty 250

## 2024-05-29 MED ORDER — ORAL CARE MOUTH RINSE: 15.0000 mL | Freq: Once | OROMUCOSAL | Status: DC

## 2024-05-29 MED ORDER — FENTANYL CITRATE (PF) 250 MCG/5ML IJ SOLN
INTRAMUSCULAR | Status: AC
  Filled 2024-05-29: qty 5

## 2024-05-29 NOTE — Plan of Care (Signed)
 Patient arrived to [address] with daughter David [name]. Patient A&O X3, medication tolerated well. PICC in right arm maintained CHG bath given. Patient spoke with doctors at bedside after arriving to unit. Patient continent of urine with urinal, one person assist. Patient left with PIVs and PICC maintained, call bell in reach and bed in lowest position.  Problem: Education: Goal: Knowledge of General Education information will improve Description: Including pain rating scale, medication(s)/side effects and non-pharmacologic comfort measures Outcome: Progressing   Problem: Health Behavior/Discharge Planning: Goal: Ability to manage health-related needs will improve Outcome: Progressing   Problem: Clinical Measurements: Goal: Ability to maintain clinical measurements within normal limits will improve Outcome: Progressing   Problem: Activity: Goal: Risk for activity intolerance will decrease Outcome: Progressing   Problem: Nutrition: Goal: Adequate nutrition will be maintained Outcome: Progressing   Problem: Coping: Goal: Level of anxiety will decrease Outcome: Progressing
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
## 2024-05-29 NOTE — Assessment & Plan Note (Addendum)
 Hypercalcemia improving. CTAP showed enlarged left paraortic and left external iliac chain lymph nodes most consistent with metastatic disease. Pending Onc f/u. CT Surg in room during rounds, plan is to get biopsy of mass this afternoon - CT Surg biopsy planned for today, NPO  - May transfer to [facility] long depending on Heme/Onc - f/u Heme/Onc recs - Fluids: ordering 1 L LR bolus followed by LR maintenance 125 mL/hr  - PTH-RP  - AM CBC w/ diff, CMP - Calcitonin 376 units SubQ BID for 4 doses (11/4) - CMP @1400 , if calcium fine, stop calcitonin - Fall precautions - RD consult for malnutrition  - Weigh patient
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
## 2024-05-29 NOTE — Progress Notes (Signed)
 Daily Progress Note Intern Pager: [phone]  Patient name: David [name] Medical record number: [id number] Date of birth: [birth year] Age: 73 y.o. Gender: male  Primary Care Provider: [facility], [address] Consultants: Heme/Onc, CT surgery Code Status: Full code, confirmed with patient Preferred Emergency Contact: Contact Information     Name Relation Home Work Mobile   [employer] Daughter [phone]  [phone]      Other Contacts   None on File      Pt Overview and Major Events to Date:  11/04: Admitted to FMTS  Assessment and Plan: David [name] is a 73 y.o. male presenting with new onset aphagia and generalized weakness found to have a new mediastinal mass with hypercalcemia concerning for malignancy with hypercalcemia, pending CT surgery evaluation for biopsy of mass. May transfer to [facility] pending Heme/Onc post biopsy.  Assessment & Plan Hypercalcemia Mediastinal mass Hypercalcemia improving. CTAP showed enlarged left paraortic and left external iliac chain lymph nodes most consistent with metastatic disease. Pending Onc f/u. CT Surg in room during rounds, plan is to get biopsy of mass this afternoon - CT Surg biopsy planned for today, NPO  - May transfer to [facility] long depending on Heme/Onc - f/u Heme/Onc recs - Fluids: ordering 1 L LR bolus followed by LR maintenance 125 mL/hr  - PTH-RP  - AM CBC w/ diff, CMP - Calcitonin 376 units SubQ BID for 4 doses (11/4) - CMP @1400 , if calcium fine, stop calcitonin - Fall precautions - RD consult for malnutrition  - Weigh patient  Discitis of cervical region Completed IV antibiotics outpatient, no leukocytosis and afebrile  PICC line still present - Consult ID on antibiotics, per note abx treatment until 11/7 vs per patient finished outpatient  - PRN home tramadol for pain control  - Miralax and Sena on for BM  Transient Aphagia Paroxysmal atrial fibrillation (HCC) Noted on long term monitoring outpatient recently.  Scheduled for follow up with cards later this month to discuss management.  - continuous tele  - discuss anticoagulation to reduce risk of stroke     FEN/GI: Regular PPx: Lovenox  Dispo:Telemetry  Subjective:  Patient in bed with daughter at bedside. CT Surg in room recommending biopsy planned for this afternoon. Patient is pleasant and   Objective: Temp:  [97.8 F (36.6 C)-98.5 F (36.9 C)] 98.1 F (36.7 C) (11/05 0445) Pulse Rate:  [69-77] 72 (11/05 0445) Resp:  [13-20] 17 (11/05 0445) BP: (142-177)/(66-77) 142/67 (11/05 0445) SpO2:  [96 %-100 %] 96 % (11/05 0445)  Physical Exam: General: Chronically ill-appearing, no acute distress, cachetic Cardio: Regular rate, regular rhythm, no murmurs on exam. Pulm: Clear, no wheezing, no crackles. No increased work of breathing Abdominal: bowel sounds present, soft, non-tender, non-distended Extremities: no peripheral edema  Neuro: alert and oriented x3, speech normal in content, no facial asymmetry, strength intact and equal bilaterally in UE and LE, pupils equal and reactive to light.   Laboratory: Most recent CBC Lab Results  Component Value Date   WBC 8.7 05/29/2024   HGB 9.9 (L) 05/29/2024   HCT 31.1 (L) 05/29/2024   MCV 87.1 05/29/2024   PLT 249 05/29/2024   Most recent BMP    Latest Ref Rng & Units 05/29/2024    3:04 AM  BMP  Glucose 70 - 99 mg/dL 876   BUN 8 - 23 mg/dL 12   Creatinine 9.38 - 1.24 mg/dL 9.10   Sodium 864 - 854 mmol/L 136   Potassium 3.5 - 5.1 mmol/L  3.5   Chloride 98 - 111 mmol/L 100   CO2 22 - 32 mmol/L 27   Calcium 8.9 - 10.3 mg/dL 88.8     Other pertinent labs: Uric acid: 5.0  LDH: 159 Lipid Panel: HDL 27, LDL 125 Hb A1c: 4.9  Imaging/Diagnostic Tests: CT-scan of abdomen and pelvis: Radiology finding -enlarged left periaortic and left external iliac chain lymph nodes most consistent with metastatic disease.  CT-scan of the chest: Radiology finding-large anterior mediastinal mass  approximately 5.0 x 8.0 x 11.4 cm partially encasing the proximal right brachiocephalic and left common carotid arteries, with marked narrowing of the left brachiocephalic vein and compression of the SVC.  Enlarged mediastinal lymph nodes including high paratracheal and right hilar nodes.  Subcentimeter thyroid  nodules and a 15 mm peripherally calcified thyroid  isthmus nodule.  Elodie Palma, MD 05/29/2024, 7:17 AM  PGY-1, [facility] FPTS Intern pager: [phone], text pages welcome Secure chat group [facility] Teaching Service

## 2024-05-29 NOTE — Progress Notes (Signed)
 PHARMACY - ANTICOAGULATION CONSULT NOTE  Pharmacy Consult for heparin Indication: atrial fibrillation  Allergies  Allergen Reactions   Bee Venom Swelling   Honey Bee Venom Swelling    Patient Measurements:    Vital Signs: Temp: 98.5 F (36.9 C) (11/05 0747) Temp Source: Oral (11/05 0445) BP: 129/84 (11/05 0747) Pulse Rate: 71 (11/05 0747)  Labs: Recent Labs    05/28/24 1350 05/28/24 1432 05/28/24 1716 05/28/24 2335 05/29/24 0304  HGB 11.2* 11.2*  --   --  9.9*  HCT 34.4* 33.0*  --   --  31.1*  PLT 270  --   --   --  249  APTT 33  --   --   --   --   LABPROT 16.0*  --   --   --   --   INR 1.2  --   --   --   --   CREATININE 1.00 1.20  --  0.91 0.89  TROPONINIHS 5  --  6  --   --     Estimated Creatinine Clearance: 86.6 mL/min (by C-G formula based on SCr of 0.89 mg/dL).   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 Pharmacy consulted to start IV heparin.   CBC stable. Hgb down 11 > 9.9, diluational following aggressive IVF for hypercalcemia treatment (s/p 2L NS + LR @125mL /hr). No signs/symptoms bleeding reported.   Goal of Therapy:  Heparin level 0.3-0.7 units/ml Monitor platelets by anticoagulation protocol: Yes   Plan:  Give IV heparin bolus 4000 units x1 Start heparin infusion at 1400 units/hr Check heparin level in 8 hours and daily while on heparin Continue to monitor H&H and platelets  Thank you for allowing pharmacy to be a part of this patient's care.  Shelba Collier, PharmD, BCPS Clinical Pharmacist

## 2024-05-29 NOTE — Progress Notes (Signed)
   [address] [place] [address]             [phone]   David [name] received a 4000 unit heparin bolus @ 1000 and then was started on a heparin drip.  Unclear exactly when heparin was stopped but would not be appropriate to proceed due to bleeding risk.  Will reschedule for tomorrow and plan to stop heparin 6 hours prior to procedure  Elspeth C. Kerrin, MD [facility] [phone]

## 2024-05-29 NOTE — Assessment & Plan Note (Signed)
 Noted on long term monitoring outpatient recently. Scheduled for follow up with cards later this month to discuss management.  - continuous tele  - discuss anticoagulation to reduce risk of stroke

## 2024-05-29 NOTE — Progress Notes (Signed)
 Transition of Care [facility]) - Inpatient Brief Assessment   Patient Details  Name: David [name] MRN: [id number] Date of Birth: 06-21-1951  Transition of Care [facility]) CM/SW Contact:    Rosaline JONELLE Joe, RN Phone Number: 05/29/2024, 3:55 PM   Clinical Narrative: Patient admitted to the hospital from home with generalized weakness and aphagia.   Patient lives with the daughter, Sharyne and Sharyne provides 24 hour care at the home.  Patient states that his other daughters plan to assist with his care at the home over the holidays as well.  Daughter, Sharyne was at the bedside and states that patient is currently active with [facility] for RN, PT.  SLP added to the home health orders  -to be co-signed by MD.  DME at the home includes rolator, RW, tub bench, potty chair.  Patient also has availability to [facility] if needed as well.  Patient has planned chest biopsy tomorrow per daughter.  PCP  [facility]  CM will continue to follow the patient for IP Care management needs as patient progresses.  Patient's daughter/patient are planning for patient to return home with continued home health services when stable.   Transition of Care Asessment: Insurance and Status: (P) Insurance coverage has been reviewed Patient has primary care physician: (P) Yes Home environment has been reviewed: (P) from home with daughter Prior level of function:: (P) family care at the home Prior/Current Home Services: (P) Current home services (Active with [facility] for PT, RN - SLP to be added, DME at the home includes - Rolator, RW, tub bench, bedside commode, WC, Cane) Social Drivers of Health Review: (P) SDOH reviewed needs interventions Readmission risk has been reviewed: (P) Yes Transition of care needs: (P) transition of care needs identified, TOC will continue to follow

## 2024-05-29 NOTE — Progress Notes (Signed)
 Pharmacy Antibiotic Note  David [name] is a 73 y.o. male admitted recently for cervical spine abscess, post-surgical debridement (C5-C6) and thoracic vertebral osteomyelitis. Pharmacy has been consulted for daptomycin dosing.  Plan: Resume daptomycin 700mg  IV Q24h Resume ceftriaxone  2g IV Q24h   Height: (P) 6' (182.9 cm) IBW/kg (Calculated) : (P) 77.6  Temp (24hrs), Avg:98.3 F (36.8 C), Min:98 F (36.7 C), Max:98.6 F (37 C)  Recent Labs  Lab 05/28/24 1350 05/28/24 1432 05/28/24 2335 05/29/24 0304  WBC 9.3  --   --  8.7  CREATININE 1.00 1.20 0.91 0.89    CrCl cannot be calculated (Unknown ideal weight.).    Allergies  Allergen Reactions   Bee Venom Anaphylaxis and Swelling   Honey Bee Venom Anaphylaxis and Swelling    Thank you for allowing pharmacy to be a part of this patient's care.  Shelba Collier, PharmD, BCPS Clinical Pharmacist

## 2024-05-29 NOTE — H&P (View-Only) (Signed)
 [name] Medical Record #[id number] Date of Birth: [birth year]  No ref. 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  An MRI of the spine was felt to be consistent with an epidural abscess or phlegmon.  He underwent C5-C6-C7 decompressive laminectomy with evacuation of cervical epidural abscess by Dr. Louis on 04/19/2024.  All of the aerobic, anaerobic, and fungal cultures were negative.  Specimens were not sent for pathology at that time.  He was seen by infectious disease and a 6-week course of antibiotics through a PICC line was recommended.  He was discharged home on 04/24/2024 and has completed the course of antibiotics at home.  He has been undergoing physical therapy at home.  He lives with his daughter who has been looking after him.  He has continued to have poor appetite, further weight loss, and generalized weakness.  His daughter said that he has been trying as hard as he can with physical therapy but is not improving.  He was admitted yesterday with some confusion and aphasia and she was concerned that he was having a stroke.  An MRI of the brain yesterday was unremarkable.  On presentation he was noted to have a calcium level of 12.0.  A CTA of the head and neck showed no carotid artery stenosis or significant vertebrobasilar intracranial disease.  It did show a large anterior mediastinal mass.  A CTA of the chest and abdomen was performed which showed a large anterior mediastinal mass partially encasing the  great vessels with marked narrowing of the brachiocephalic vein and compression of the SVC.  There are enlarged mediastinal lymph nodes in the paratracheal and right hilar regions as well as some enlarged left periaortic and left external iliac lymph nodes most consistent with metastatic disease.  Cardiothoracic surgery consultation was requested for biopsy of the mediastinal mass.     Past Medical History:  Diagnosis Date   Cancer [facility])    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   Chicken pox    History of skin cancer    Polyp of colon     Past Surgical History:  Procedure Laterality Date   APPENDECTOMY     CIRCUMCISION     at age 58 yrs old   COLONOSCOPY     FORAMINOTOMY, SPINE, CERVICAL, 1 LEVEL N/A 04/19/2024   Procedure: CERVICAL FIVE- SIX-SEVEN LAMINECTOMY FOR EPIDURAL ABSCESS;  Surgeon: Louis Shove, MD;  Location: [facility] OR;  Service: Neurosurgery;  Laterality: N/A;  CERVICAL FIVE- SIX-SEVEN LAMINECTOMY  FOR EPIDURAL ABSCESS   penile biopsy  03/01/2022    Social History   Tobacco Use  Smoking Status Never  Smokeless Tobacco Never    Social History   Substance and Sexual Activity  Alcohol Use Yes   Comment: occasionally    Social History   Socioeconomic History   Marital status: Married    Spouse name: Not on file  Number of children: Not on file   Years of education: Not on file   Highest education level: Not on file  Occupational History   Not on file  Tobacco Use   Smoking status: Never   Smokeless tobacco: Never  Vaping Use   Vaping status: Never Used  Substance and Sexual Activity   Alcohol use: Yes    Comment: occasionally   Drug use: No   Sexual activity: Not on file  Other Topics Concern   Not on file  Social History Narrative   Not on file   Social Drivers of Health   Financial Resource Strain: Not on file  Food Insecurity: No Food Insecurity (05/28/2024)   Hunger Vital Sign    Worried About Running Out of Food in the Last Year: Never true     Ran Out of Food in the Last Year: Never true  Transportation Needs: No Transportation Needs (05/28/2024)   PRAPARE - Administrator, Civil Service (Medical): No    Lack of Transportation (Non-Medical): No  Physical Activity: Not on file  Stress: Not on file  Social Connections: Moderately Integrated (05/28/2024)   Social Connection and Isolation Panel    Frequency of Communication with Friends and Family: More than three times a week    Frequency of Social Gatherings with Friends and Family: More than three times a week    Attends Religious Services: More than 4 times per year    Active Member of [employer] or Organizations: Yes    Attends Engineer, Structural: More than 4 times per year    Marital Status: Divorced  Intimate Partner Violence: Not At Risk (05/28/2024)   Humiliation, Afraid, Rape, and Kick questionnaire    Fear of Current or Ex-Partner: No    Emotionally Abused: No    Physically Abused: No    Sexually Abused: No    Allergies  Allergen Reactions   Bee Venom Anaphylaxis and Swelling   Honey Bee Venom Anaphylaxis and Swelling    Current Facility-Administered Medications  Medication Dose Route Frequency Provider Last Rate Last Admin   0.9 %  sodium chloride  infusion (Manually program via Guardrails IV Fluids)   Intravenous Once Quashon Jesus K, MD       acetaminophen  (TYLENOL ) tablet 650 mg  650 mg Oral Q6H PRN Cleotilde Perkins, DO       calcitonin (MIACALCIN) injection 376 Units  4 Units/kg Subcutaneous BID Cleotilde Perkins, DO   376 Units at 05/28/24 1957   Chlorhexidine  Gluconate Cloth 2 % PADS 6 each  6 each Topical Daily Delores Suzann HERO, MD       feeding supplement (ENSURE PLUS HIGH PROTEIN) liquid 237 mL  237 mL Oral BID BM Sundil, Subrina, MD       heparin bolus via infusion 4,000 Units  4,000 Units Intravenous Once Pray, Margaret E, MD       Followed by   heparin ADULT infusion 100 units/mL (25000 units/250mL)  1,400 Units/hr Intravenous Continuous Pray,  Rollene BRAVO, MD       lactated ringers  infusion   Intravenous Continuous Cleotilde Perkins, DO 125 mL/hr at 05/29/24 0339 Infusion Verify at 05/29/24 9660   metoprolol  succinate (TOPROL -XL) 24 hr tablet 50 mg  50 mg Oral Daily Majeed, Sara, DO   50 mg at 05/28/24 2329   polyethylene glycol (MIRALAX / GLYCOLAX) packet 17 g  17 g Oral Daily PRN Cleotilde Perkins, DO       senna (SENOKOT) tablet 8.6 mg  1 tablet Oral QHS Cleotilde Perkins, DO   8.6 mg at 05/28/24 2258   tiZANidine (ZANAFLEX) tablet 4 mg  4 mg Oral Q8H PRN Howell Lunger, DO   4 mg at 05/28/24 2258   traMADol (ULTRAM) tablet 50 mg  50 mg Oral Daily PRN Cleotilde Perkins, DO   50 mg at 05/28/24 2051     Family History  Problem Relation Age of Onset   Arthritis Mother    Diabetes Mother    Heart attack Father    Heart attack Brother    Stroke Brother    Hypertension Brother    Hypertension Brother      Review of Systems:     Cardiac Review of Systems: Y or N  Chest Pain [  n  ]  Resting SOB [n   ] Exertional SOB  [ n ]  Dorien lis ]   Pedal Edema [Y  ]    Palpitations [ n ] Syncope  [n  ]  Presyncope [n ]   General Review of Systems: [Y] = yes [  ]=no  Constitional: recent weight change [Y]; anorexia [ Y ]; fatigue [ Y ]; nausea [n ]; night sweats [Y ]; fever [Y  ]; or chills [ n ];                                                                                                                                          Dental: poor dentition[n  ];    Eye : blurred vision [n ]; diplopia [n ]; vision changes [ n ];  Amaurosis fugax[ n];  Resp: cough [n  ];  wheezing[ n ];  hemoptysis[ n ]; shortness of breath[ n ]; paroxysmal nocturnal dyspnea[ n ]; dyspnea on exertion[ n; or orthopnea[ n ];   GI:  gallstones[ n], vomiting[ n ];  dysphagia[n]; melena[ n ];  hematochezia [n ]; heartburn[ n ];   Hx of  Colonoscopy[  n];  GU: kidney stones [n  ]; hematuria[n  ];   dysuria [n ];  nocturia[  n];  history of     obstruction [n  ];               Skin: rash, swelling[ n ];, hair loss[n];  peripheral edema[n];  or itching[ n ];  Musculosketetal: myalgias[ n ];  joint swelling[ n ];  joint erythema[ n ];  joint pain[ n ];  back pain[ n ];   Heme/Lymph: bruising[ n ];  bleeding[ n;  anemia[ n ];   Neuro: TIA[ n ];  headaches[  n];  stroke[  n];  vertigo[ n ];  seizures[ n ];   paresthesias[  n];  difficulty walking[n  ]; aphasia, confusion and balance difficulty.   Psych:depression[ n]; anxiety[ n ];   Endocrine: diabetes[  n  thyroid  dysfunction[ n ]   Immunizations: Flu [ n ];  Pneumococcal[ n ];   Other:  Physical Exam: BP 129/84 (BP Location: Left Arm)   Pulse 71   Temp 98.5 F (36.9 C)   Resp 17   SpO2 97%   General appearance: slowed mentation Neurologic: intact Heart: regular rate and rhythm, S1, S2 normal, no murmur Lungs: clear to auscultation bilaterally Abdomen: soft, non-tender; bowel sounds normal; no masses,  no organomegaly Extremities: edema mild in lower legs   Diagnostic Studies & Laboratory data:     Recent Radiology Findings:   CT CHEST W CONTRAST Result Date: 05/28/2024 EXAM: CT CHEST WITH CONTRAST 05/28/2024 09:58:07 PM TECHNIQUE: CT of the chest was performed with the administration of 75 mL of iohexol (OMNIPAQUE) 350 MG/ML injection. Multiplanar reformatted images are provided for review. Automated exposure control, iterative reconstruction, and/or weight based adjustment of the mA/kV was utilized to reduce the radiation dose to as low as reasonably achievable. COMPARISON: None available. CLINICAL HISTORY: Mediastinal mass. FINDINGS: MEDIASTINUM: Heart and pericardium are unremarkable. The central airways are clear. There is an anterior mediastinal mass from the level of the origin of the great vessels extending to the level of the aortic valve. This measures approximately 5.0 x 8.0 x 11.4 cm. This partially encompasses the proximal right brachiocephalic and left common carotid arteries. There is  marked narrowing of the distal left brachiocephalic vein and there is also compression of the superior vena cava. Right-sided central venous catheter tip is in the distal SVC. Atherosclerotic calcifications of the aorta and iliac arteries. There is a small hiatal hernia. Appears soft tissue mass extending between the IVC and right greater saphenous artery measuring 3.5 x 2.6 cm. There are subcentimeter hypodense thyroid  nodules. There is a peripherally calcified nodule in the isthmus of the thyroid  measuring 15 mm. LYMPH NODES: Enlarged high right paratracheal lymph nodes measuring up to 10 mm short axis. Large right hilar lymph nodes measure up to 10 mm short axis. No axillary lymphadenopathy. LUNGS AND PLEURA: There is a small amount of atelectasis or scarring in both lung bases and the lingula. The lungs are otherwise clear. No pleural effusion or pneumothorax. SOFT TISSUES/BONES: Revealed right fourth and fifth rib fractures. Degenerative changes of the spine. There is some sclerosis in the lateral left fifth rib, indeterminate. No acute abnormality of the soft tissues. UPPER ABDOMEN: Limited images of the upper abdomen demonstrates gallstones are present. No other acute abnormality. IMPRESSION: 1. 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 Recommend urgent multidisciplinary evaluation (oncology/thoracic surgery) and tissue diagnosis. 2. Enlarged mediastinal lymph nodes, including high paratracheal and right hilar nodes. 3. Subcentimeter thyroid  nodules and a 15 mm peripherally calcified thyroid  isthmus nodule. Given presumed age 45, recommend non-emergent thyroid  ultrasound due to 1.5 cm nodule. 4. Indeterminate sclerosis at the lateral left fifth rib.  Electronically signed by: Greig Pique MD 05/28/2024 10:14 PM EST RP Workstation: [id number]   CT ABDOMEN PELVIS W CONTRAST Result Date: 05/28/2024 EXAM: CT ABDOMEN AND PELVIS WITH CONTRAST 05/28/2024 07:56:00 PM TECHNIQUE: CT of the abdomen and pelvis was performed with the administration of 75 mL of iohexol (OMNIPAQUE) 350 MG/ML injection. Multiplanar reformatted images are provided for review. Automated exposure control, iterative reconstruction, and/or weight-based adjustment of the mA/kV was utilized to reduce the radiation dose to as low as reasonably achievable. COMPARISON: None  available. CLINICAL HISTORY: Metastatic disease evaluation. FINDINGS: LOWER CHEST: No acute abnormality. LIVER: The liver is unremarkable. GALLBLADDER AND BILE DUCTS: Gallbladder is unremarkable. No biliary ductal dilatation. SPLEEN: No acute abnormality. PANCREAS: No acute abnormality. ADRENAL GLANDS: No acute abnormality. KIDNEYS, URETERS AND BLADDER: There is a finding near the fundus measuring 2.5 cm. There is trabeculated bladder wall. No stones in the kidneys or ureters. No hydronephrosis. No perinephric or periureteral stranding. GI AND BOWEL: Appendix is not visualized. Stomach demonstrates no acute abnormality. There is no bowel obstruction. PERITONEUM AND RETROPERITONEUM: There is presacral edema. No ascites. No free air. VASCULATURE: Aorta is normal in caliber. LYMPH NODES: There are enlarged left periaortic lymph nodes measuring up to 15 mm short axis. There are enlarged left external iliac chain lymph nodes measuring up to 17 mm short axis. REPRODUCTIVE ORGANS: Prostate gland is prominent in size. BONES AND SOFT TISSUES: Degenerative changes affecting spine. No acute osseous abnormality. No focal soft tissue abnormality. IMPRESSION: 1. Enlarged left periaortic and left external iliac chain lymph nodes most consistent with metastatic disease. 2. Trabeculated bladder. 3. Prostatomegaly. 4. Presacral edema. Electronically  signed by: Greig Pique MD 05/28/2024 08:10 PM EST RP Workstation: [id number]   MR BRAIN WO CONTRAST Result Date: 05/28/2024 EXAM: MRI BRAIN WITHOUT CONTRAST 05/28/2024 04:33:54 PM TECHNIQUE: Multiplanar multisequence MRI of the head/brain was performed without the administration of intravenous contrast. COMPARISON: CTA head and neck 05/28/2024. CLINICAL HISTORY: Neuro deficit, acute, stroke suspected. Generalized weakness, aphasia, and headache. FINDINGS: BRAIN AND VENTRICLES: There is no evidence of an acute infarct, mass, midline shift, hydrocephalus, or extra-axial fluid collection. There is mild cerebral atrophy. A 7 mm focus of susceptibility in the left parietal lobe may reflect a chronic hemorrhage or small cavernoma. A punctate chronic microhemorrhage is noted in the right occipital lobe. Small T2 hyperintensities in the cerebral white matter bilaterally are nonspecific but compatible with minimal chronic small vessel ischemic disease, not atypical for age. Major intracranial vascular flow voids are preserved. ORBITS: No acute abnormality. SINUSES AND MASTOIDS: No acute abnormality. BONES AND SOFT TISSUES: Normal marrow signal. No acute soft tissue abnormality. IMPRESSION: 1. Largely unremarkable appearance of the brain for age. No acute intracranial abnormality. Electronically signed by: Dasie Hamburg MD 05/28/2024 04:50 PM EST RP Workstation: [id number]   CT ANGIO HEAD NECK W WO CM Result Date: 05/28/2024 CLINICAL DATA:  Neuro deficit, acute stroke suspected EXAM: CT ANGIOGRAPHY HEAD AND NECK WITH AND WITHOUT CONTRAST TECHNIQUE: Multidetector CT imaging of the head and neck was performed using the standard protocol during bolus administration of intravenous contrast. Multiplanar CT image reconstructions and MIPs were obtained to evaluate the vascular anatomy. Carotid stenosis measurements (when applicable) are obtained utilizing NASCET criteria, using the distal internal carotid diameter as the  denominator. RADIATION DOSE REDUCTION: This exam was performed according to the departmental dose-optimization program which includes automated exposure control, adjustment of the mA and/or kV according to patient size and/or use of iterative reconstruction technique. CONTRAST:  75mL OMNIPAQUE IOHEXOL 350 MG/ML SOLN COMPARISON:  None Available. FINDINGS: CT HEAD: Attenuation in the brain parenchyma is normal. There is no hemorrhage. No acute ischemic changes. No mass lesion. The ventricles are normal. Skull/sinuses/orbits: No significant abnormality. CTA NECK: CTA NECK Aortic arch: No proximal vessel stenosis. Right carotid: Normal Left carotid: Normal Right vertebral: Normal Left vertebral: Normal Soft tissues: No significant abnormality Other comments: There is a right arm PICC line in place. There is a large anterior mediastinal mass that measures a proximally 4.7 x  9.4 11 cm CTA HEAD: CTA HEAD Right anterior circulation: The internal carotid artery is patent without significant intracranial stenosis. The A1 segment of the anterior cerebral artery is absent. The middle cerebral artery is normal. Left anterior circulation: The internal carotid artery and middle cerebral arteries are normal. There is an azygos anterior cerebral artery. Posterior circulation: Both vertebral arteries are patent. There is no significant basilar stenosis. Both posterior cerebral arteries are patent without significant stenosis or proximal branch occlusion. No aneurysm. IMPRESSION: 1. No carotid artery stenosis 2. No significant vertebrobasilar or intracranial disease 3. 4.7 x 9.4 x 11 cm anterior mediastinal mass Electronically Signed   By: Nancyann Burns M.D.   On: 05/28/2024 15:57      Recent Lab Findings: Lab Results  Component Value Date   WBC 8.7 05/29/2024   HGB 9.9 (L) 05/29/2024   HCT 31.1 (L) 05/29/2024   PLT 249 05/29/2024   GLUCOSE 123 (H) 05/29/2024   CHOL 168 05/29/2024   TRIG 78 05/29/2024   HDL 27 (L)  05/29/2024   LDLCALC 125 (H) 05/29/2024   ALT 14 05/29/2024   AST 15 05/29/2024   NA 136 05/29/2024   K 3.5 05/29/2024   CL 100 05/29/2024   CREATININE 0.89 05/29/2024   BUN 12 05/29/2024   CO2 27 05/29/2024   TSH 5.715 (H) 05/15/2024   INR 1.2 05/28/2024   HGBA1C 4.9 05/29/2024      Assessment / Plan:      This 73 year old gentleman has a 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  He has had at least a 29-month course of progressive generalized weakness, anorexia, weight loss, night sweats, and fever.  He was recently treated for a cervical epidural abscess/phlegmon although all cultures were negative.  Pathology was not sent and is possible that this was the same process that is in his mediastinum.  This will require tissue for diagnosis.  I reviewed the case and CT scan with Dr. Kerrin and he will plan to do a biopsy later this afternoon.      Dorise LOIS Fellers, MD 05/29/2024 9:57 AM

## 2024-05-29 NOTE — Progress Notes (Signed)
 Dr. Kerrin cancelling procedure at this time due to Heparin gtt. Apolinar, LPN and OR made aware. Patient transferred back to 2W-07.

## 2024-05-29 NOTE — Progress Notes (Signed)
 Initial Nutrition Assessment  DOCUMENTATION CODES:   Not applicable  INTERVENTION:  Recommend regular diet once back from biopsy  Encourage PO intake Ensure Plus High Protein po BID, each supplement provides 350 kcal and 20 grams of protein. Obtain updated admission weight   NUTRITION DIAGNOSIS:   Inadequate oral intake related to decreased appetite as evidenced by other (comment) (chart review).   GOAL:   Patient will meet greater than or equal to 90% of their needs   MONITOR:   Supplement acceptance, PO intake  REASON FOR ASSESSMENT:   Consult Assessment of nutrition requirement/status  ASSESSMENT:   History of recent C-spine surgery for discitis presenting with symptomatic hypercalcemia and anterior mediastinal mass with SVC and arterial narrowing  Patient unavailable x 2, receiving nursing care this morning and then off unit this afternoon for biopsy. Chart reviewed. Pt with new large anterior mediastinal mass, concern for malignancy. Pt with associated decreased appetite and weight lose. Last weight in chart from 10/22, no weight this admission. Pt has history of squamous cell cancer of the penis in 02/2022    Admit /current weight: none    Nutritionally Relevant Medications: Senokot  Labs Reviewed: Calcium 11.1   NUTRITION - FOCUSED PHYSICAL EXAM:  Unable to assess   Diet Order:   Diet Order             Diet Heart Room service appropriate? Yes; Fluid consistency: Thin  Diet effective now                   EDUCATION NEEDS:   No education needs have been identified at this time  Skin:  Skin Assessment: Skin Integrity Issues: Skin Integrity Issues:: Incisions Incisions: neck  Last BM:  11/2  Height:   Ht Readings from Last 1 Encounters:  05/29/24 (P) 6' (1.829 m)    Weight:   Wt Readings from Last 1 Encounters:  05/15/24 93.9 kg    Ideal Body Weight:  80.9 kg  BMI:  Body mass index is 28.07 kg/m (pended).  Estimated  Nutritional Needs:   Kcal:  2000-2400  Protein:  80-100 grams  Fluid:  >2L/d  Laurali Goddard, MS, RD, LDN Clinical Dietitian  Contact via secure chat. If unavailable, use group chat RD Inpatient.

## 2024-05-29 NOTE — Assessment & Plan Note (Addendum)
 Completed IV antibiotics outpatient, no leukocytosis and afebrile  PICC line still present - Consult ID on antibiotics, per note abx treatment until 11/7 vs per patient finished outpatient  - PRN home tramadol for pain control  - Miralax and Sena on for BM

## 2024-05-29 NOTE — Consult Note (Signed)
 [facility] Medical Record #[id number] Date of Birth: [birth year]  No ref. 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  An MRI of the spine was felt to be consistent with an epidural abscess or phlegmon.  He underwent C5-C6-C7 decompressive laminectomy with evacuation of cervical epidural abscess by Dr. Louis on 04/19/2024.  All of the aerobic, anaerobic, and fungal cultures were negative.  Specimens were not sent for pathology at that time.  He was seen by infectious disease and a 6-week course of antibiotics through a PICC line was recommended.  He was discharged home on 04/24/2024 and has completed the course of antibiotics at home.  He has been undergoing physical therapy at home.  He lives with his daughter who has been looking after him.  He has continued to have poor appetite, further weight loss, and generalized weakness.  His daughter said that he has been trying as hard as he can with physical therapy but is not improving.  He was admitted yesterday with some confusion and aphasia and she was concerned that he was having a stroke.  An MRI of the brain yesterday was unremarkable.  On presentation he was noted to have a calcium level of 12.0.  A CTA of the head and neck showed no carotid artery stenosis or significant vertebrobasilar intracranial disease.  It did show a large anterior mediastinal mass.  A CTA of the chest and abdomen was performed which showed a large anterior mediastinal mass partially encasing the  great vessels with marked narrowing of the brachiocephalic vein and compression of the SVC.  There are enlarged mediastinal lymph nodes in the paratracheal and right hilar regions as well as some enlarged left periaortic and left external iliac lymph nodes most consistent with metastatic disease.  Cardiothoracic surgery consultation was requested for biopsy of the mediastinal mass.     Past Medical History:  Diagnosis Date   Cancer [facility])    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   Chicken pox    History of skin cancer    Polyp of colon     Past Surgical History:  Procedure Laterality Date   APPENDECTOMY     CIRCUMCISION     at age 58 yrs old   COLONOSCOPY     FORAMINOTOMY, SPINE, CERVICAL, 1 LEVEL N/A 04/19/2024   Procedure: CERVICAL FIVE- SIX-SEVEN LAMINECTOMY FOR EPIDURAL ABSCESS;  Surgeon: Louis Shove, MD;  Location: [facility] OR;  Service: Neurosurgery;  Laterality: N/A;  CERVICAL FIVE- SIX-SEVEN LAMINECTOMY  FOR EPIDURAL ABSCESS   penile biopsy  03/01/2022    Social History   Tobacco Use  Smoking Status Never  Smokeless Tobacco Never    Social History   Substance and Sexual Activity  Alcohol Use Yes   Comment: occasionally    Social History   Socioeconomic History   Marital status: Married    Spouse name: Not on file  Number of children: Not on file   Years of education: Not on file   Highest education level: Not on file  Occupational History   Not on file  Tobacco Use   Smoking status: Never   Smokeless tobacco: Never  Vaping Use   Vaping status: Never Used  Substance and Sexual Activity   Alcohol use: Yes    Comment: occasionally   Drug use: No   Sexual activity: Not on file  Other Topics Concern   Not on file  Social History Narrative   Not on file   Social Drivers of Health   Financial Resource Strain: Not on file  Food Insecurity: No Food Insecurity (05/28/2024)   Hunger Vital Sign    Worried About Running Out of Food in the Last Year: Never true     Ran Out of Food in the Last Year: Never true  Transportation Needs: No Transportation Needs (05/28/2024)   PRAPARE - Administrator, Civil Service (Medical): No    Lack of Transportation (Non-Medical): No  Physical Activity: Not on file  Stress: Not on file  Social Connections: Moderately Integrated (05/28/2024)   Social Connection and Isolation Panel    Frequency of Communication with Friends and Family: More than three times a week    Frequency of Social Gatherings with Friends and Family: More than three times a week    Attends Religious Services: More than 4 times per year    Active Member of [employer] or Organizations: Yes    Attends Engineer, Structural: More than 4 times per year    Marital Status: Divorced  Intimate Partner Violence: Not At Risk (05/28/2024)   Humiliation, Afraid, Rape, and Kick questionnaire    Fear of Current or Ex-Partner: No    Emotionally Abused: No    Physically Abused: No    Sexually Abused: No    Allergies  Allergen Reactions   Bee Venom Anaphylaxis and Swelling   Honey Bee Venom Anaphylaxis and Swelling    Current Facility-Administered Medications  Medication Dose Route Frequency Provider Last Rate Last Admin   0.9 %  sodium chloride  infusion (Manually program via Guardrails IV Fluids)   Intravenous Once Quashon Jesus K, MD       acetaminophen  (TYLENOL ) tablet 650 mg  650 mg Oral Q6H PRN Cleotilde Perkins, DO       calcitonin (MIACALCIN) injection 376 Units  4 Units/kg Subcutaneous BID Cleotilde Perkins, DO   376 Units at 05/28/24 1957   Chlorhexidine  Gluconate Cloth 2 % PADS 6 each  6 each Topical Daily Delores Suzann HERO, MD       feeding supplement (ENSURE PLUS HIGH PROTEIN) liquid 237 mL  237 mL Oral BID BM Sundil, Subrina, MD       heparin bolus via infusion 4,000 Units  4,000 Units Intravenous Once Pray, Margaret E, MD       Followed by   heparin ADULT infusion 100 units/mL (25000 units/250mL)  1,400 Units/hr Intravenous Continuous Pray,  Rollene BRAVO, MD       lactated ringers  infusion   Intravenous Continuous Cleotilde Perkins, DO 125 mL/hr at 05/29/24 0339 Infusion Verify at 05/29/24 9660   metoprolol  succinate (TOPROL -XL) 24 hr tablet 50 mg  50 mg Oral Daily Majeed, Sara, DO   50 mg at 05/28/24 2329   polyethylene glycol (MIRALAX / GLYCOLAX) packet 17 g  17 g Oral Daily PRN Cleotilde Perkins, DO       senna (SENOKOT) tablet 8.6 mg  1 tablet Oral QHS Cleotilde Perkins, DO   8.6 mg at 05/28/24 2258   tiZANidine (ZANAFLEX) tablet 4 mg  4 mg Oral Q8H PRN Howell Lunger, DO   4 mg at 05/28/24 2258   traMADol (ULTRAM) tablet 50 mg  50 mg Oral Daily PRN Cleotilde Perkins, DO   50 mg at 05/28/24 2051     Family History  Problem Relation Age of Onset   Arthritis Mother    Diabetes Mother    Heart attack Father    Heart attack Brother    Stroke Brother    Hypertension Brother    Hypertension Brother      Review of Systems:     Cardiac Review of Systems: Y or N  Chest Pain [  n  ]  Resting SOB [n   ] Exertional SOB  [ n ]  Dorien lis ]   Pedal Edema [Y  ]    Palpitations [ n ] Syncope  [n  ]  Presyncope [n ]   General Review of Systems: [Y] = yes [  ]=no  Constitional: recent weight change [Y]; anorexia [ Y ]; fatigue [ Y ]; nausea [n ]; night sweats [Y ]; fever [Y  ]; or chills [ n ];                                                                                                                                          Dental: poor dentition[n  ];    Eye : blurred vision [n ]; diplopia [n ]; vision changes [ n ];  Amaurosis fugax[ n];  Resp: cough [n  ];  wheezing[ n ];  hemoptysis[ n ]; shortness of breath[ n ]; paroxysmal nocturnal dyspnea[ n ]; dyspnea on exertion[ n; or orthopnea[ n ];   GI:  gallstones[ n], vomiting[ n ];  dysphagia[n]; melena[ n ];  hematochezia [n ]; heartburn[ n ];   Hx of  Colonoscopy[  n];  GU: kidney stones [n  ]; hematuria[n  ];   dysuria [n ];  nocturia[  n];  history of     obstruction [n  ];               Skin: rash, swelling[ n ];, hair loss[n];  peripheral edema[n];  or itching[ n ];  Musculosketetal: myalgias[ n ];  joint swelling[ n ];  joint erythema[ n ];  joint pain[ n ];  back pain[ n ];   Heme/Lymph: bruising[ n ];  bleeding[ n;  anemia[ n ];   Neuro: TIA[ n ];  headaches[  n];  stroke[  n];  vertigo[ n ];  seizures[ n ];   paresthesias[  n];  difficulty walking[n  ]; aphasia, confusion and balance difficulty.   Psych:depression[ n]; anxiety[ n ];   Endocrine: diabetes[  n  thyroid  dysfunction[ n ]   Immunizations: Flu [ n ];  Pneumococcal[ n ];   Other:  Physical Exam: BP 129/84 (BP Location: Left Arm)   Pulse 71   Temp 98.5 F (36.9 C)   Resp 17   SpO2 97%   General appearance: slowed mentation Neurologic: intact Heart: regular rate and rhythm, S1, S2 normal, no murmur Lungs: clear to auscultation bilaterally Abdomen: soft, non-tender; bowel sounds normal; no masses,  no organomegaly Extremities: edema mild in lower legs   Diagnostic Studies & Laboratory data:     Recent Radiology Findings:   CT CHEST W CONTRAST Result Date: 05/28/2024 EXAM: CT CHEST WITH CONTRAST 05/28/2024 09:58:07 PM TECHNIQUE: CT of the chest was performed with the administration of 75 mL of iohexol (OMNIPAQUE) 350 MG/ML injection. Multiplanar reformatted images are provided for review. Automated exposure control, iterative reconstruction, and/or weight based adjustment of the mA/kV was utilized to reduce the radiation dose to as low as reasonably achievable. COMPARISON: None available. CLINICAL HISTORY: Mediastinal mass. FINDINGS: MEDIASTINUM: Heart and pericardium are unremarkable. The central airways are clear. There is an anterior mediastinal mass from the level of the origin of the great vessels extending to the level of the aortic valve. This measures approximately 5.0 x 8.0 x 11.4 cm. This partially encompasses the proximal right brachiocephalic and left common carotid arteries. There is  marked narrowing of the distal left brachiocephalic vein and there is also compression of the superior vena cava. Right-sided central venous catheter tip is in the distal SVC. Atherosclerotic calcifications of the aorta and iliac arteries. There is a small hiatal hernia. Appears soft tissue mass extending between the IVC and right greater saphenous artery measuring 3.5 x 2.6 cm. There are subcentimeter hypodense thyroid  nodules. There is a peripherally calcified nodule in the isthmus of the thyroid  measuring 15 mm. LYMPH NODES: Enlarged high right paratracheal lymph nodes measuring up to 10 mm short axis. Large right hilar lymph nodes measure up to 10 mm short axis. No axillary lymphadenopathy. LUNGS AND PLEURA: There is a small amount of atelectasis or scarring in both lung bases and the lingula. The lungs are otherwise clear. No pleural effusion or pneumothorax. SOFT TISSUES/BONES: Revealed right fourth and fifth rib fractures. Degenerative changes of the spine. There is some sclerosis in the lateral left fifth rib, indeterminate. No acute abnormality of the soft tissues. UPPER ABDOMEN: Limited images of the upper abdomen demonstrates gallstones are present. No other acute abnormality. IMPRESSION: 1. 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 Recommend urgent multidisciplinary evaluation (oncology/thoracic surgery) and tissue diagnosis. 2. Enlarged mediastinal lymph nodes, including high paratracheal and right hilar nodes. 3. Subcentimeter thyroid  nodules and a 15 mm peripherally calcified thyroid  isthmus nodule. Given presumed age 45, recommend non-emergent thyroid  ultrasound due to 1.5 cm nodule. 4. Indeterminate sclerosis at the lateral left fifth rib.  Electronically signed by: Greig Pique MD 05/28/2024 10:14 PM EST RP Workstation: [id number]   CT ABDOMEN PELVIS W CONTRAST Result Date: 05/28/2024 EXAM: CT ABDOMEN AND PELVIS WITH CONTRAST 05/28/2024 07:56:00 PM TECHNIQUE: CT of the abdomen and pelvis was performed with the administration of 75 mL of iohexol (OMNIPAQUE) 350 MG/ML injection. Multiplanar reformatted images are provided for review. Automated exposure control, iterative reconstruction, and/or weight-based adjustment of the mA/kV was utilized to reduce the radiation dose to as low as reasonably achievable. COMPARISON: None  available. CLINICAL HISTORY: Metastatic disease evaluation. FINDINGS: LOWER CHEST: No acute abnormality. LIVER: The liver is unremarkable. GALLBLADDER AND BILE DUCTS: Gallbladder is unremarkable. No biliary ductal dilatation. SPLEEN: No acute abnormality. PANCREAS: No acute abnormality. ADRENAL GLANDS: No acute abnormality. KIDNEYS, URETERS AND BLADDER: There is a finding near the fundus measuring 2.5 cm. There is trabeculated bladder wall. No stones in the kidneys or ureters. No hydronephrosis. No perinephric or periureteral stranding. GI AND BOWEL: Appendix is not visualized. Stomach demonstrates no acute abnormality. There is no bowel obstruction. PERITONEUM AND RETROPERITONEUM: There is presacral edema. No ascites. No free air. VASCULATURE: Aorta is normal in caliber. LYMPH NODES: There are enlarged left periaortic lymph nodes measuring up to 15 mm short axis. There are enlarged left external iliac chain lymph nodes measuring up to 17 mm short axis. REPRODUCTIVE ORGANS: Prostate gland is prominent in size. BONES AND SOFT TISSUES: Degenerative changes affecting spine. No acute osseous abnormality. No focal soft tissue abnormality. IMPRESSION: 1. Enlarged left periaortic and left external iliac chain lymph nodes most consistent with metastatic disease. 2. Trabeculated bladder. 3. Prostatomegaly. 4. Presacral edema. Electronically  signed by: Greig Pique MD 05/28/2024 08:10 PM EST RP Workstation: [id number]   MR BRAIN WO CONTRAST Result Date: 05/28/2024 EXAM: MRI BRAIN WITHOUT CONTRAST 05/28/2024 04:33:54 PM TECHNIQUE: Multiplanar multisequence MRI of the head/brain was performed without the administration of intravenous contrast. COMPARISON: CTA head and neck 05/28/2024. CLINICAL HISTORY: Neuro deficit, acute, stroke suspected. Generalized weakness, aphasia, and headache. FINDINGS: BRAIN AND VENTRICLES: There is no evidence of an acute infarct, mass, midline shift, hydrocephalus, or extra-axial fluid collection. There is mild cerebral atrophy. A 7 mm focus of susceptibility in the left parietal lobe may reflect a chronic hemorrhage or small cavernoma. A punctate chronic microhemorrhage is noted in the right occipital lobe. Small T2 hyperintensities in the cerebral white matter bilaterally are nonspecific but compatible with minimal chronic small vessel ischemic disease, not atypical for age. Major intracranial vascular flow voids are preserved. ORBITS: No acute abnormality. SINUSES AND MASTOIDS: No acute abnormality. BONES AND SOFT TISSUES: Normal marrow signal. No acute soft tissue abnormality. IMPRESSION: 1. Largely unremarkable appearance of the brain for age. No acute intracranial abnormality. Electronically signed by: Dasie Hamburg MD 05/28/2024 04:50 PM EST RP Workstation: [id number]   CT ANGIO HEAD NECK W WO CM Result Date: 05/28/2024 CLINICAL DATA:  Neuro deficit, acute stroke suspected EXAM: CT ANGIOGRAPHY HEAD AND NECK WITH AND WITHOUT CONTRAST TECHNIQUE: Multidetector CT imaging of the head and neck was performed using the standard protocol during bolus administration of intravenous contrast. Multiplanar CT image reconstructions and MIPs were obtained to evaluate the vascular anatomy. Carotid stenosis measurements (when applicable) are obtained utilizing NASCET criteria, using the distal internal carotid diameter as the  denominator. RADIATION DOSE REDUCTION: This exam was performed according to the departmental dose-optimization program which includes automated exposure control, adjustment of the mA and/or kV according to patient size and/or use of iterative reconstruction technique. CONTRAST:  75mL OMNIPAQUE IOHEXOL 350 MG/ML SOLN COMPARISON:  None Available. FINDINGS: CT HEAD: Attenuation in the brain parenchyma is normal. There is no hemorrhage. No acute ischemic changes. No mass lesion. The ventricles are normal. Skull/sinuses/orbits: No significant abnormality. CTA NECK: CTA NECK Aortic arch: No proximal vessel stenosis. Right carotid: Normal Left carotid: Normal Right vertebral: Normal Left vertebral: Normal Soft tissues: No significant abnormality Other comments: There is a right arm PICC line in place. There is a large anterior mediastinal mass that measures a proximally 4.7 x  9.4 11 cm CTA HEAD: CTA HEAD Right anterior circulation: The internal carotid artery is patent without significant intracranial stenosis. The A1 segment of the anterior cerebral artery is absent. The middle cerebral artery is normal. Left anterior circulation: The internal carotid artery and middle cerebral arteries are normal. There is an azygos anterior cerebral artery. Posterior circulation: Both vertebral arteries are patent. There is no significant basilar stenosis. Both posterior cerebral arteries are patent without significant stenosis or proximal branch occlusion. No aneurysm. IMPRESSION: 1. No carotid artery stenosis 2. No significant vertebrobasilar or intracranial disease 3. 4.7 x 9.4 x 11 cm anterior mediastinal mass Electronically Signed   By: Nancyann Burns M.D.   On: 05/28/2024 15:57      Recent Lab Findings: Lab Results  Component Value Date   WBC 8.7 05/29/2024   HGB 9.9 (L) 05/29/2024   HCT 31.1 (L) 05/29/2024   PLT 249 05/29/2024   GLUCOSE 123 (H) 05/29/2024   CHOL 168 05/29/2024   TRIG 78 05/29/2024   HDL 27 (L)  05/29/2024   LDLCALC 125 (H) 05/29/2024   ALT 14 05/29/2024   AST 15 05/29/2024   NA 136 05/29/2024   K 3.5 05/29/2024   CL 100 05/29/2024   CREATININE 0.89 05/29/2024   BUN 12 05/29/2024   CO2 27 05/29/2024   TSH 5.715 (H) 05/15/2024   INR 1.2 05/28/2024   HGBA1C 4.9 05/29/2024      Assessment / Plan:      This 73 year old gentleman has a 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  He has had at least a 29-month course of progressive generalized weakness, anorexia, weight loss, night sweats, and fever.  He was recently treated for a cervical epidural abscess/phlegmon although all cultures were negative.  Pathology was not sent and is possible that this was the same process that is in his mediastinum.  This will require tissue for diagnosis.  I reviewed the case and CT scan with Dr. Kerrin and he will plan to do a biopsy later this afternoon.      Dorise LOIS Fellers, MD 05/29/2024 9:57 AM

## 2024-05-29 NOTE — Consult Note (Addendum)
 [facility] CONSULT NOTE  Patient Care Team: Health, [address] as PCP - General  CHIEF COMPLAINTS/PURPOSE OF CONSULTATION:  Anterior mediastinal mass with abdominal lymphadenopathy  REFERRING PHYSICIAN: Dr. Donzetta  HISTORY OF PRESENTING ILLNESS:  David [name] 73 y.o. male admitted on 05/28/2024 with complaints of symptomatic hypercalcemia and anterior mediastinal mass with SVC.  Therefore Oncology evaluation has been requested.  Patient with recent C-spine surgery. He had presented 03/2024 with complaints of several weeks history of weight loss 30 lbs over several months, anorexia, and neck pain radiating to shoulders with neuropathy of bil UE.  Complained of ongoing weakness, fatigue, poor appetite, constipation, and heartburn post surgery.  Patient was receiving physical therapy, however did not seem to be getting any stronger. Daughter took him to be checked out about 2 weeks ago and was told he had Afib.  She became concerned when she noticed confusion and aphasia and he was subsequently admitted 11/4 for further evaluation. Imaging showed mediastinal mass and lymphadenopathy as stated above.  Patient seen and assessed today, being taken for biopsy.  Therefore most of information received from his daughter who he lives with.  Medical history significant for A-fib.  Oncologic history includes SCC of penis. Surgical history includes recent C-spine surgery.  Family oncologic history includes one uncle with unknown cancer. Maternal grandfather had cardiac issues.  Social history is noncontributory: denies tobacco use, alcohol use, and illicit or recreational drug use.  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  Currently lives with his daughter.    I have reviewed his chart and materials related to his cancer extensively and collaborated history with the patient. Summary of oncologic history is as follows: Oncology History   No history exists.    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  -- CT abdomen/pelvis done 11/4 showed enlarged left periaortic and left external iliac chain lymph nodes consistent with metastatic disease.  -- MR brain done 11/4 was unremarkable.  -- Cardio thoracic evaluation done and plans noted for biopsy today.  Path of mass to determine further oncologic treatment planning.  -- Medical Oncology/Dr. Sherrod following  Hypercalcemia -- Calcium elevated 13.1 on admission 11/4.  Shows response to therapy, 11.1 today.  -- Status post Zometa 4 mg x1.   -- Continue Calcitonin subcu as ordered. -- Continue IVF -- Continue to monitor CMP closely  Anemia, normocytic -- Hemoglobin 9.9, likely dilutional effect. HGB 11.2 on admission. -- No transfusional intervention warranted at this time.  -- Continue to monitor CBC with differential  Aphasia AMS -- Admitted 11/4 with complaints of aphasia and AMS -- MR brain unremarkable -- Patient appears stable and answers appropriately at this time -- Continue supportive care  Discitis, cervical -- Status post surgery on 04/19/24 -- On IV antibiotics  History of SCC of Penis -- Diagnosed 08/2021 and treated.   Afib -- On heparin -- Monitor closely for bleeding -- Continue close follow with Cardio       MEDICAL HISTORY:  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   Chicken pox    History of skin cancer    Polyp of colon     SURGICAL HISTORY: Past Surgical History:  Procedure Laterality Date   APPENDECTOMY  CIRCUMCISION     at age 77 yrs old   COLONOSCOPY     FORAMINOTOMY, SPINE, CERVICAL, 1 LEVEL N/A 04/19/2024   Procedure: CERVICAL FIVE- SIX-SEVEN LAMINECTOMY FOR EPIDURAL ABSCESS;  Surgeon: Louis Shove, MD;  Location: [facility] OR;  Service: Neurosurgery;  Laterality: N/A;  CERVICAL FIVE- SIX-SEVEN LAMINECTOMY  FOR EPIDURAL ABSCESS   penile biopsy  03/01/2022    SOCIAL HISTORY: Social History   Socioeconomic History   Marital status: Married    Spouse name: Not on file   Number of children: Not on file   Years of education: Not on file   Highest education level: Not on file  Occupational History   Not on file  Tobacco Use   Smoking status: Never   Smokeless tobacco: Never  Vaping Use   Vaping status: Never Used  Substance and Sexual Activity   Alcohol use: Yes    Comment: occasionally   Drug use: No   Sexual activity: Not on file  Other Topics Concern   Not on file  Social History Narrative   Not on file   Social Drivers of Health   Financial Resource Strain: Not on file  Food Insecurity: No Food Insecurity (05/28/2024)   Hunger Vital Sign    Worried About Running Out of Food in the Last Year: Never true    Ran Out of Food in the Last Year: Never true  Transportation Needs: No Transportation Needs (05/28/2024)   PRAPARE - Administrator, Civil Service (Medical): No    Lack of Transportation (Non-Medical): No  Physical Activity: Not on file  Stress: Not on file  Social Connections: Moderately Integrated (05/28/2024)   Social Connection and Isolation Panel    Frequency of Communication with Friends and Family: More than three times a week    Frequency of Social Gatherings with Friends and Family: More than three times a week    Attends Religious Services: More than 4 times per year    Active Member of [employer] or Organizations: Yes    Attends Engineer, Structural: More than 4 times per year    Marital Status: Divorced  Intimate Partner Violence: Not At Risk (05/28/2024)   Humiliation, Afraid, Rape, and Kick questionnaire    Fear of Current or Ex-Partner: No    Emotionally Abused: No    Physically Abused: No    Sexually Abused: No    FAMILY  HISTORY: Family History  Problem Relation Age of Onset   Arthritis Mother    Diabetes Mother    Heart attack Father    Heart attack Brother    Stroke Brother    Hypertension Brother    Hypertension Brother      PHYSICAL EXAMINATION: ECOG PERFORMANCE STATUS: 3 - Symptomatic, >50% confined to bed  Vitals:   05/29/24 0747 05/29/24 1152  BP: 129/84 (!) 140/59  Pulse: 71 72  Resp:    Temp: 98.5 F (36.9 C) 98.6 F (37 C)  SpO2: 97% 97%   There were no vitals filed for this visit.  GENERAL: alert, +comfortable +elderly +NAD SKIN: skin color, texture, turgor are normal, no rashes or significant lesions EYES: normal, conjunctiva are pink and non-injected, sclera clear OROPHARYNX: no exudate, no erythema and lips, buccal mucosa, and tongue normal  NECK: supple, thyroid  normal size, non-tender, without nodularity LYMPH: no palpable lymphadenopathy in the cervical, axillary or inguinal LUNGS: clear to auscultation and percussion with normal breathing effort HEART: regular rate & rhythm and no murmurs  and no lower extremity edema ABDOMEN: abdomen soft, non-tender and normal bowel sounds MUSCULOSKELETAL: no cyanosis of digits and no clubbing  PSYCH: alert & oriented x 3 with fluent speech NEURO: no focal motor/sensory deficits   ALLERGIES:  is allergic to bee venom and honey bee venom.  MEDICATIONS:  Current Facility-Administered Medications  Medication Dose Route Frequency Provider Last Rate Last Admin   acetaminophen  (TYLENOL ) tablet 650 mg  650 mg Oral Q6H PRN Cleotilde Perkins, DO       calcitonin (MIACALCIN) injection 376 Units  4 Units/kg Subcutaneous BID Cleotilde Perkins, DO   376 Units at 05/29/24 1054   Chlorhexidine  Gluconate Cloth 2 % PADS 6 each  6 each Topical Daily Delores Suzann HERO, MD   6 each at 05/29/24 1020   feeding supplement (ENSURE PLUS HIGH PROTEIN) liquid 237 mL  237 mL Oral BID BM Sundil, Subrina, MD       heparin ADULT infusion 100 units/mL (25000 units/250mL)   1,400 Units/hr Intravenous Continuous Donzetta Rollene BRAVO, MD 14 mL/hr at 05/29/24 1036 1,400 Units/hr at 05/29/24 1036   lactated ringers  infusion   Intravenous Continuous Cleotilde Perkins, DO 125 mL/hr at 05/29/24 0339 Infusion Verify at 05/29/24 9660   metoprolol  succinate (TOPROL -XL) 24 hr tablet 50 mg  50 mg Oral Daily Majeed, Sara, DO   50 mg at 05/29/24 1013   polyethylene glycol (MIRALAX / GLYCOLAX) packet 17 g  17 g Oral Daily PRN Cleotilde Perkins, DO       senna (SENOKOT) tablet 8.6 mg  1 tablet Oral QHS Cleotilde Perkins, DO   8.6 mg at 05/28/24 2258   tiZANidine (ZANAFLEX) tablet 4 mg  4 mg Oral Q8H PRN Howell Lunger, DO   4 mg at 05/28/24 2258   traMADol (ULTRAM) tablet 50 mg  50 mg Oral Daily PRN Cleotilde Perkins, DO   50 mg at 05/28/24 2051     LABORATORY DATA:  I have reviewed the data as listed Lab Results  Component Value Date   WBC 8.7 05/29/2024   HGB 9.9 (L) 05/29/2024   HCT 31.1 (L) 05/29/2024   MCV 87.1 05/29/2024   PLT 249 05/29/2024   Recent Labs    05/15/24 1113 05/15/24 1157 05/28/24 1350 05/28/24 1432 05/28/24 2335 05/29/24 0304  NA 135   < > 135 136 134* 136  K 3.0*   < > 3.7 3.7 3.5 3.5  CL 100   < > 96* 99 99 100  CO2 28  --  27  --  25 27  GLUCOSE 108*   < > 125* 122* 124* 123*  BUN 18   < > 16 18 13 12   CREATININE 1.01   < > 1.00 1.20 0.91 0.89  CALCIUM 10.9*  --  13.1*  --  11.4* 11.1*  GFRNONAA >60  --  >60  --  >60 >60  PROT 6.4*  --  7.0  --   --  6.4*  ALBUMIN 2.6*  --  3.2*  --   --  2.9*  AST 19  --  18  --   --  15  ALT 20  --  17  --   --  14  ALKPHOS 154*  --  149*  --   --  133*  BILITOT 0.6  --  0.5  --   --  0.6   < > = values in this interval not displayed.    RADIOGRAPHIC STUDIES: I have personally reviewed the radiological images as  listed and agreed with the findings in the report. CT CHEST W CONTRAST Result Date: 05/28/2024 EXAM: CT CHEST WITH CONTRAST 05/28/2024 09:58:07 PM TECHNIQUE: CT of the chest was performed with the  administration of 75 mL of iohexol (OMNIPAQUE) 350 MG/ML injection. Multiplanar reformatted images are provided for review. Automated exposure control, iterative reconstruction, and/or weight based adjustment of the mA/kV was utilized to reduce the radiation dose to as low as reasonably achievable. COMPARISON: None available. CLINICAL HISTORY: Mediastinal mass. FINDINGS: MEDIASTINUM: Heart and pericardium are unremarkable. The central airways are clear. There is an anterior mediastinal mass from the level of the origin of the great vessels extending to the level of the aortic valve. This measures approximately 5.0 x 8.0 x 11.4 cm. This partially encompasses the proximal right brachiocephalic and left common carotid arteries. There is marked narrowing of the distal left brachiocephalic vein and there is also compression of the superior vena cava. Right-sided central venous catheter tip is in the distal SVC. Atherosclerotic calcifications of the aorta and iliac arteries. There is a small hiatal hernia. Appears soft tissue mass extending between the IVC and right greater saphenous artery measuring 3.5 x 2.6 cm. There are subcentimeter hypodense thyroid  nodules. There is a peripherally calcified nodule in the isthmus of the thyroid  measuring 15 mm. LYMPH NODES: Enlarged high right paratracheal lymph nodes measuring up to 10 mm short axis. Large right hilar lymph nodes measure up to 10 mm short axis. No axillary lymphadenopathy. LUNGS AND PLEURA: There is a small amount of atelectasis or scarring in both lung bases and the lingula. The lungs are otherwise clear. No pleural effusion or pneumothorax. SOFT TISSUES/BONES: Revealed right fourth and fifth rib fractures. Degenerative changes of the spine. There is some sclerosis in the lateral left fifth rib, indeterminate. No acute abnormality of the soft tissues. UPPER ABDOMEN: Limited images of the upper abdomen demonstrates gallstones are present. No other acute  abnormality. IMPRESSION: 1. 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 Recommend urgent multidisciplinary evaluation (oncology/thoracic surgery) and tissue diagnosis. 2. Enlarged mediastinal lymph nodes, including high paratracheal and right hilar nodes. 3. Subcentimeter thyroid  nodules and a 15 mm peripherally calcified thyroid  isthmus nodule. Given presumed age 31, recommend non-emergent thyroid  ultrasound due to 1.5 cm nodule. 4. Indeterminate sclerosis at the lateral left fifth rib. Electronically signed by: Greig Pique MD 05/28/2024 10:14 PM EST RP Workstation: [id number]   CT ABDOMEN PELVIS W CONTRAST Result Date: 05/28/2024 EXAM: CT ABDOMEN AND PELVIS WITH CONTRAST 05/28/2024 07:56:00 PM TECHNIQUE: CT of the abdomen and pelvis was performed with the administration of 75 mL of iohexol (OMNIPAQUE) 350 MG/ML injection. Multiplanar reformatted images are provided for review. Automated exposure control, iterative reconstruction, and/or weight-based adjustment of the mA/kV was utilized to reduce the radiation dose to as low as reasonably achievable. COMPARISON: None available. CLINICAL HISTORY: Metastatic disease evaluation. FINDINGS: LOWER CHEST: No acute abnormality. LIVER: The liver is unremarkable. GALLBLADDER AND BILE DUCTS: Gallbladder is unremarkable. No biliary ductal dilatation. SPLEEN: No acute abnormality. PANCREAS: No acute abnormality. ADRENAL GLANDS: No acute abnormality. KIDNEYS, URETERS AND BLADDER: There is a finding near the fundus measuring 2.5 cm. There is trabeculated bladder wall. No stones in the kidneys or ureters. No hydronephrosis. No perinephric or periureteral stranding. GI AND BOWEL: Appendix is not visualized.  Stomach demonstrates no  acute abnormality. There is no bowel obstruction. PERITONEUM AND RETROPERITONEUM: There is presacral edema. No ascites. No free air. VASCULATURE: Aorta is normal in caliber. LYMPH NODES: There are enlarged left periaortic lymph nodes measuring up to 15 mm short axis. There are enlarged left external iliac chain lymph nodes measuring up to 17 mm short axis. REPRODUCTIVE ORGANS: Prostate gland is prominent in size. BONES AND SOFT TISSUES: Degenerative changes affecting spine. No acute osseous abnormality. No focal soft tissue abnormality. IMPRESSION: 1. Enlarged left periaortic and left external iliac chain lymph nodes most consistent with metastatic disease. 2. Trabeculated bladder. 3. Prostatomegaly. 4. Presacral edema. Electronically signed by: Greig Pique MD 05/28/2024 08:10 PM EST RP Workstation: [id number]   MR BRAIN WO CONTRAST Result Date: 05/28/2024 EXAM: MRI BRAIN WITHOUT CONTRAST 05/28/2024 04:33:54 PM TECHNIQUE: Multiplanar multisequence MRI of the head/brain was performed without the administration of intravenous contrast. COMPARISON: CTA head and neck 05/28/2024. CLINICAL HISTORY: Neuro deficit, acute, stroke suspected. Generalized weakness, aphasia, and headache. FINDINGS: BRAIN AND VENTRICLES: There is no evidence of an acute infarct, mass, midline shift, hydrocephalus, or extra-axial fluid collection. There is mild cerebral atrophy. A 7 mm focus of susceptibility in the left parietal lobe may reflect a chronic hemorrhage or small cavernoma. A punctate chronic microhemorrhage is noted in the right occipital lobe. Small T2 hyperintensities in the cerebral white matter bilaterally are nonspecific but compatible with minimal chronic small vessel ischemic disease, not atypical for age. Major intracranial vascular flow voids are preserved. ORBITS: No acute abnormality. SINUSES AND MASTOIDS: No acute abnormality. BONES AND SOFT TISSUES: Normal marrow signal. No acute soft tissue abnormality. IMPRESSION:  1. Largely unremarkable appearance of the brain for age. No acute intracranial abnormality. Electronically signed by: Dasie Hamburg MD 05/28/2024 04:50 PM EST RP Workstation: [id number]   CT ANGIO HEAD NECK W WO CM Result Date: 05/28/2024 CLINICAL DATA:  Neuro deficit, acute stroke suspected EXAM: CT ANGIOGRAPHY HEAD AND NECK WITH AND WITHOUT CONTRAST TECHNIQUE: Multidetector CT imaging of the head and neck was performed using the standard protocol during bolus administration of intravenous contrast. Multiplanar CT image reconstructions and MIPs were obtained to evaluate the vascular anatomy. Carotid stenosis measurements (when applicable) are obtained utilizing NASCET criteria, using the distal internal carotid diameter as the denominator. RADIATION DOSE REDUCTION: This exam was performed according to the departmental dose-optimization program which includes automated exposure control, adjustment of the mA and/or kV according to patient size and/or use of iterative reconstruction technique. CONTRAST:  75mL OMNIPAQUE IOHEXOL 350 MG/ML SOLN COMPARISON:  None Available. FINDINGS: CT HEAD: Attenuation in the brain parenchyma is normal. There is no hemorrhage. No acute ischemic changes. No mass lesion. The ventricles are normal. Skull/sinuses/orbits: No significant abnormality. CTA NECK: CTA NECK Aortic arch: No proximal vessel stenosis. Right carotid: Normal Left carotid: Normal Right vertebral: Normal Left vertebral: Normal Soft tissues: No significant abnormality Other comments: There is a right arm PICC line in place. 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 The A1 segment of the anterior cerebral artery is absent. The middle cerebral artery is normal. Left anterior circulation: The internal carotid artery and middle cerebral arteries are  normal. There is an azygos anterior cerebral artery. Posterior circulation: Both vertebral arteries are patent. There is no significant basilar stenosis. Both posterior cerebral arteries are patent without significant stenosis or proximal  branch occlusion. No aneurysm. IMPRESSION: 1. No carotid artery stenosis 2. No significant vertebrobasilar or intracranial disease 3. 4.7 x 9.4 x 11 cm anterior mediastinal mass Electronically Signed   By: Nancyann Burns M.D.   On: 05/28/2024 15:57   DG Chest Portable 1 View Result Date: 05/15/2024 CLINICAL DATA:  AFib. EXAM: PORTABLE CHEST 1 VIEW COMPARISON:  05/14/2024 FINDINGS: Right-sided PICC line unchanged. Lungs are adequately inflated. There is no acute airspace process or effusion. Cardiomediastinal silhouette and remainder of the exam is unchanged for IMPRESSION: No acute cardiopulmonary disease. Electronically Signed   By: Toribio Agreste M.D.   On: 05/15/2024 12:32   DG Chest Portable 1 View Result Date: 05/14/2024 CLINICAL DATA:  Bilateral ankle swelling, recent surgery, r/o effusion EXAM: PORTABLE CHEST - 1 VIEW COMPARISON:  08/30/2017 FINDINGS: Right PICC terminating in the mid SVC. Radiopaque metallic structure overlying the left upper lung zone, likely external to the patient. No focal airspace consolidation, pleural effusion, or pneumothorax. Mild cardiomegaly. Tortuous aorta with aortic atherosclerosis. Multilevel thoracic osteophytosis. IMPRESSION: 1. Mild cardiomegaly. No pneumonia or pulmonary edema. 2. Right PICC terminates in the mid SVC. Electronically Signed   By: Rogelia Myers M.D.   On: 05/14/2024 18:36     The total time spent in the appointment was 55 minutes encounter with patients including review of chart and various tests results, discussions about plan of care and coordination of care plan   All questions were answered. The patient knows to call the clinic with any problems, questions or concerns. No barriers to learning was  detected.  Olam JINNY Brunner, NP 11/5/202512:40 PM   ADDENDUM: Hematology/Oncology Attending: The patient is seen and examined today.  I agree with the above note. The patient is a 73 year old with atrial fibrillation and recent back surgery who presents with a mediastinal mass suspicious for lymphoma. He is accompanied by his daughter, David.  His medical issues began after back surgery on April 19, 2024, for an infection at C5, C6, and C7. Post-surgery, he was on IV antibiotics administered at home by his daughter. He was supposed to have his PICC line removed on May 28, 2024, after completing his antibiotic course.  On April 24, 2024, he developed atrial fibrillation and was treated with amiodarone to restore rhythm. He was discharged on April 26, 2024, and was doing well initially, taking pain medications and participating in physical therapy. On October 18-19, 2025, his daughter noticed significant ankle swelling, prompting a visit to the emergency room on May 13, 2024, where fluid around his heart was discovered. He was treated with Lasix and potassium. He was also monitored for atrial fibrillation with a heart monitor due to concerns about starting blood thinners post-surgery. On May 14, 2024, he experienced a brief episode of atrial fibrillation. The following day, he had a prolonged episode, leading to another emergency room visit where his heart rhythm was stabilized. Post-episode, he experienced diminished energy, lack of appetite, and slurred speech.  By May 20, 2024, his condition worsened with increased fatigue, slurred speech, and confusion. He was unable to articulate clearly and had difficulty recognizing objects, leading to another emergency room visit. Imaging revealed a large mass in the anterior mediastinum and several mediastinal lymph nodes. He has chest pain radiating to his left arm and shoulder. No nausea, vomiting, diarrhea, night sweats, or significant  weight loss, although he has lost weight recently.  ASSESSMENT AND PLAN: Anterior mediastinal mass and mediastinal/abdominal lymphadenopathy, possible lymphoma A large mass in the anterior  mediastinum with mediastinal and abdominal lymphadenopathy suggests possible lymphoma. Differential diagnosis includes other malignancies such as lung cancer. Biopsy is pending for definitive diagnosis. If lymphoma, treatment may improve symptoms and potentially cure the condition. If lung cancer, treatment, I will continue to manage. - Dr. Kerrin with Cardiothoracic surgery will perform biopsy of the mediastinal mass to confirm diagnosis - Will coordinate with Dr. Federico for potential lymphoma management If the final biopsy is consistent with lymphoma. - Will plan for PET scan as an outpatient procedure - Will consider port placement for treatment administration  Atrial fibrillation Recurrent episodes of atrial fibrillation, possibly related to the mediastinal mass. Managed with amiodarone and cardioversion. Anticoagulation is deferred due to recent surgery and heparin use. - Will reassess need for anticoagulation post-biopsy and stabilization  Status post cervical spine surgery for infection (C5-7) Post-surgical status following cervical spine surgery for infection at C5-7. Recovery complicated by atrial fibrillation and mediastinal mass. Pain management and physical therapy ongoing. - Continue pain management and physical therapy  Hypertension Blood pressure slightly elevated, possibly related to recent illness and medication use. Managed with metoprolol . - Continue metoprolol  for blood pressure management  Mitral valve prolapse Known mitral valve prolapse with no acute issues reported. - Continue monitoring for any cardiac symptoms Thank you for allowing me to participate in the care of Mr. [name].  We will continue to follow-up the patient with you and assist in his management.  Disclaimer:  This note was dictated with voice recognition software. Similar sounding words can inadvertently be transcribed and may be missed upon review. Sherrod MARLA Sherrod, MD

## 2024-05-29 NOTE — Plan of Care (Signed)
 Called CT-surgeon on call Dr. Lucas to discuss biopsy on anterior mediastinal mass. He will pass on to his partner Dr. Shyrl or Dr. Kerrin to evaluate.   Damien Pinal, DO [facility], PGY-3 05/29/24 7:30 AM

## 2024-05-29 NOTE — Anesthesia Preprocedure Evaluation (Signed)
 Anesthesia Evaluation    Airway        Dental   Pulmonary           Cardiovascular hypertension, Pt. on medications and Pt. on home beta blockers + dysrhythmias Atrial Fibrillation and Supra Ventricular Tachycardia   Echo: 04/24/24 1. Left ventricular ejection fraction, by estimation, is 60 to 65%. The left ventricle has normal function. The left ventricle has no regional wall motion abnormalities. Left ventricular diastolic parameters are indeterminate.  2. Right ventricular systolic function is normal. The right ventricular size is normal.  3. Left atrial size was mild to moderately dilated.  4. Right atrial size was mildly dilated.  5. The mitral valve is degenerative. Trivial mitral valve regurgitation.  6. The aortic valve is tricuspid. There is moderate calcification of the aortic valve. There is moderate thickening of the aortic valve. Aortic valve regurgitation is not visualized. Aortic valve sclerosis/calcification is present, without any evidence  of aortic stenosis.     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

## 2024-05-30 ENCOUNTER — Encounter (HOSPITAL_COMMUNITY)
Admission: EM | Disposition: A | Discharge status: Home Health | Payer: Self-pay | Source: Home / Self Care | Attending: Family Medicine

## 2024-05-30 ENCOUNTER — Ambulatory Visit: Admitting: Internal Medicine

## 2024-05-30 ENCOUNTER — Encounter (HOSPITAL_COMMUNITY): Payer: Self-pay | Admitting: Student

## 2024-05-30 ENCOUNTER — Other Ambulatory Visit: Payer: Self-pay

## 2024-05-30 ENCOUNTER — Inpatient Hospital Stay (HOSPITAL_COMMUNITY): Admitting: Anesthesiology

## 2024-05-30 ENCOUNTER — Inpatient Hospital Stay (HOSPITAL_COMMUNITY)

## 2024-05-30 DIAGNOSIS — J9859 Other diseases of mediastinum, not elsewhere classified: Secondary | ICD-10-CM | POA: Diagnosis not present

## 2024-05-30 DIAGNOSIS — E876 Hypokalemia: Secondary | ICD-10-CM | POA: Insufficient documentation

## 2024-05-30 HISTORY — PX: INTERCOSTAL NERVE BLOCK: SHX5021

## 2024-05-30 HISTORY — PX: VIDEO ASSISTED THORACOSCOPY (VATS)/ LOBECTOMY: SHX6169

## 2024-05-30 LAB — COMPREHENSIVE METABOLIC PANEL WITH GFR
ALT: 12 U/L (ref 0–44)
AST: 15 U/L (ref 15–41)
Albumin: 2.7 g/dL — ABNORMAL LOW (ref 3.5–5.0)
Alkaline Phosphatase: 119 U/L (ref 38–126)
Anion gap: 11 (ref 5–15)
BUN: 12 mg/dL (ref 8–23)
CO2: 24 mmol/L (ref 22–32)
Calcium: 9.9 mg/dL (ref 8.9–10.3)
Chloride: 103 mmol/L (ref 98–111)
Creatinine, Ser: 0.92 mg/dL (ref 0.61–1.24)
GFR, Estimated: >60 mL/min (ref >60)
Glucose, Bld: 101 mg/dL — ABNORMAL HIGH (ref 70–99)
Potassium: 3.1 mmol/L — ABNORMAL LOW (ref 3.5–5.1)
Sodium: 138 mmol/L (ref 135–145)
Total Bilirubin: 0.5 mg/dL (ref 0.0–1.2)
Total Protein: 6.1 g/dL — ABNORMAL LOW (ref 6.5–8.1)

## 2024-05-30 LAB — CBC WITH DIFFERENTIAL/PLATELET
Abs Immature Granulocytes: 0.04 K/uL (ref 0.00–0.07)
Basophils Absolute: 0 K/uL (ref 0.0–0.1)
Basophils Relative: 0 %
Eosinophils Absolute: 0 K/uL (ref 0.0–0.5)
Eosinophils Relative: 0 %
HCT: 29.4 % — ABNORMAL LOW (ref 39.0–52.0)
Hemoglobin: 9.4 g/dL — ABNORMAL LOW (ref 13.0–17.0)
Immature Granulocytes: 0 %
Lymphocytes Relative: 18 %
Lymphs Abs: 1.6 K/uL (ref 0.7–4.0)
MCH: 27.9 pg (ref 26.0–34.0)
MCHC: 32 g/dL (ref 30.0–36.0)
MCV: 87.2 fL (ref 80.0–100.0)
Monocytes Absolute: 1.1 K/uL — ABNORMAL HIGH (ref 0.1–1.0)
Monocytes Relative: 12 %
Neutro Abs: 6.4 K/uL (ref 1.7–7.7)
Neutrophils Relative %: 70 %
Platelets: 228 K/uL (ref 150–400)
RBC: 3.37 MIL/uL — ABNORMAL LOW (ref 4.22–5.81)
RDW: 15.9 % — ABNORMAL HIGH (ref 11.5–15.5)
WBC: 9.1 K/uL (ref 4.0–10.5)
nRBC: 0 % (ref 0.0–0.2)

## 2024-05-30 LAB — PREPARE RBC (CROSSMATCH)

## 2024-05-30 LAB — PHOSPHORUS: Phosphorus: 2.1 mg/dL — ABNORMAL LOW (ref 2.5–4.6)

## 2024-05-30 LAB — MAGNESIUM: Magnesium: 1.9 mg/dL (ref 1.7–2.4)

## 2024-05-30 LAB — SURGICAL PCR SCREEN
MRSA, PCR: NEGATIVE
Staphylococcus aureus: NEGATIVE

## 2024-05-30 LAB — HEPARIN LEVEL (UNFRACTIONATED): Heparin Unfractionated: 0.27 [IU]/mL — ABNORMAL LOW (ref 0.30–0.70)

## 2024-05-30 SURGERY — VIDEO ASSISTED THORACOSCOPY (VATS)/ LOBECTOMY
Anesthesia: General | Site: Chest

## 2024-05-30 MED ORDER — ORAL CARE MOUTH RINSE: 15.0000 mL | Freq: Once | OROMUCOSAL | Status: AC

## 2024-05-30 MED ORDER — SODIUM CHLORIDE (PF) 0.9 % IJ SOLN
INTRAMUSCULAR | Status: AC
  Filled 2024-05-30: qty 50

## 2024-05-30 MED ORDER — ACETAMINOPHEN 500 MG PO TABS
1000.0000 mg | ORAL_TABLET | Freq: Four times a day (QID) | ORAL | Status: DC
  Administered 2024-05-31 – 2024-06-01 (×7): 1000 mg via ORAL
  Filled 2024-05-30 (×10): qty 2

## 2024-05-30 MED ORDER — POTASSIUM PHOSPHATES 15 MMOLE/5ML IV SOLN
30.0000 mmol | Freq: Once | INTRAVENOUS | Status: AC
  Administered 2024-05-30: 30 mmol via INTRAVENOUS
  Filled 2024-05-30 (×2): qty 10

## 2024-05-30 MED ORDER — ONDANSETRON HCL 4 MG/2ML IJ SOLN: 4.0000 mg | Freq: Three times a day (TID) | INTRAMUSCULAR | Status: DC | PRN

## 2024-05-30 MED ORDER — ONDANSETRON HCL 4 MG/2ML IJ SOLN
INTRAMUSCULAR | Status: DC | PRN
  Administered 2024-05-30: 4 mg via INTRAVENOUS

## 2024-05-30 MED ORDER — METOPROLOL SUCCINATE ER 50 MG PO TB24
50.0000 mg | ORAL_TABLET | Freq: Every day | ORAL | Status: DC
  Administered 2024-05-31 – 2024-06-04 (×5): 50 mg via ORAL
  Filled 2024-05-30 (×5): qty 1

## 2024-05-30 MED ORDER — HYDROMORPHONE HCL 1 MG/ML IJ SOLN
INTRAMUSCULAR | Status: AC
  Filled 2024-05-30: qty 1

## 2024-05-30 MED ORDER — PANTOPRAZOLE SODIUM 40 MG PO TBEC
40.0000 mg | DELAYED_RELEASE_TABLET | Freq: Every day | ORAL | Status: DC
  Administered 2024-05-31 – 2024-06-06 (×7): 40 mg via ORAL
  Filled 2024-05-30 (×7): qty 1

## 2024-05-30 MED ORDER — FENTANYL CITRATE (PF) 100 MCG/2ML IJ SOLN
INTRAMUSCULAR | Status: AC
  Filled 2024-05-30: qty 2

## 2024-05-30 MED ORDER — TRAMADOL HCL 50 MG PO TABS
50.0000 mg | ORAL_TABLET | Freq: Four times a day (QID) | ORAL | Status: DC | PRN
  Administered 2024-06-04 – 2024-06-06 (×2): 50 mg via ORAL
  Filled 2024-05-30 (×2): qty 1

## 2024-05-30 MED ORDER — SUGAMMADEX SODIUM 200 MG/2ML IV SOLN
INTRAVENOUS | Status: DC | PRN
  Administered 2024-05-30: 200 mg via INTRAVENOUS

## 2024-05-30 MED ORDER — LIDOCAINE 2% (20 MG/ML) 5 ML SYRINGE
INTRAMUSCULAR | Status: DC | PRN
  Administered 2024-05-30: 80 mg via INTRAVENOUS

## 2024-05-30 MED ORDER — LACTATED RINGERS IV SOLN: INTRAVENOUS | Status: DC | PRN

## 2024-05-30 MED ORDER — BUPIVACAINE LIPOSOME 1.3 % IJ SUSP
INTRAMUSCULAR | Status: AC
  Filled 2024-05-30: qty 20

## 2024-05-30 MED ORDER — ACETAMINOPHEN 10 MG/ML IV SOLN
INTRAVENOUS | Status: DC | PRN
  Administered 2024-05-30: 1000 mg via INTRAVENOUS

## 2024-05-30 MED ORDER — ACETAMINOPHEN 160 MG/5ML PO SOLN
1000.0000 mg | Freq: Four times a day (QID) | ORAL | Status: DC
  Administered 2024-06-01: 1000 mg via ORAL
  Filled 2024-05-30: qty 40.6

## 2024-05-30 MED ORDER — DROPERIDOL 2.5 MG/ML IJ SOLN
0.6250 mg | Freq: Once | INTRAMUSCULAR | Status: DC | PRN
Start: 2024-05-30 — End: 2024-05-30

## 2024-05-30 MED ORDER — 0.9 % SODIUM CHLORIDE (POUR BTL) OPTIME
TOPICAL | Status: DC | PRN
  Administered 2024-05-30: 2000 mL

## 2024-05-30 MED ORDER — FENTANYL CITRATE (PF) 50 MCG/ML IJ SOSY
25.0000 ug | PREFILLED_SYRINGE | INTRAMUSCULAR | Status: DC | PRN
  Administered 2024-05-30 – 2024-05-31 (×3): 50 ug via INTRAVENOUS
  Filled 2024-05-30 (×3): qty 1

## 2024-05-30 MED ORDER — POTASSIUM CHLORIDE CRYS ER 20 MEQ PO TBCR
40.0000 meq | EXTENDED_RELEASE_TABLET | Freq: Once | ORAL | Status: DC
  Filled 2024-05-30: qty 2

## 2024-05-30 MED ORDER — SUCCINYLCHOLINE CHLORIDE 200 MG/10ML IV SOSY
PREFILLED_SYRINGE | INTRAVENOUS | Status: DC | PRN
  Administered 2024-05-30: 120 mg via INTRAVENOUS

## 2024-05-30 MED ORDER — KETOROLAC TROMETHAMINE 15 MG/ML IJ SOLN
15.0000 mg | Freq: Four times a day (QID) | INTRAMUSCULAR | Status: AC
  Administered 2024-05-30 – 2024-05-31 (×5): 15 mg via INTRAVENOUS
  Filled 2024-05-30 (×5): qty 1

## 2024-05-30 MED ORDER — SODIUM CHLORIDE FLUSH 0.9 % IV SOLN
INTRAVENOUS | Status: DC | PRN
  Administered 2024-05-30: 25 mL

## 2024-05-30 MED ORDER — FENTANYL CITRATE (PF) 250 MCG/5ML IJ SOLN
INTRAMUSCULAR | Status: AC
  Filled 2024-05-30: qty 5

## 2024-05-30 MED ORDER — HYDROMORPHONE HCL 1 MG/ML IJ SOLN
0.2500 mg | INTRAMUSCULAR | Status: DC | PRN
  Administered 2024-05-30: 0.5 mg via INTRAVENOUS

## 2024-05-30 MED ORDER — OXYCODONE HCL 5 MG PO TABS
5.0000 mg | ORAL_TABLET | ORAL | Status: DC | PRN
  Administered 2024-05-31 – 2024-06-01 (×3): 10 mg via ORAL
  Filled 2024-05-30 (×3): qty 2

## 2024-05-30 MED ORDER — ONDANSETRON HCL 4 MG/2ML IJ SOLN: 4.0000 mg | Freq: Four times a day (QID) | INTRAMUSCULAR | Status: DC | PRN

## 2024-05-30 MED ORDER — BUPIVACAINE HCL (PF) 0.5 % IJ SOLN
INTRAMUSCULAR | Status: AC
Start: 2024-05-30 — End: 2024-05-30
  Filled 2024-05-30: qty 30

## 2024-05-30 MED ORDER — ROCURONIUM BROMIDE 10 MG/ML (PF) SYRINGE
PREFILLED_SYRINGE | INTRAVENOUS | Status: DC | PRN
  Administered 2024-05-30: 50 mg via INTRAVENOUS

## 2024-05-30 MED ORDER — PROPOFOL 10 MG/ML IV BOLUS
INTRAVENOUS | Status: DC | PRN
  Administered 2024-05-30: 100 mg via INTRAVENOUS

## 2024-05-30 MED ORDER — SENNOSIDES-DOCUSATE SODIUM 8.6-50 MG PO TABS
1.0000 | ORAL_TABLET | Freq: Every day | ORAL | Status: DC
  Administered 2024-05-30 – 2024-06-05 (×5): 1 via ORAL
  Filled 2024-05-30 (×5): qty 1

## 2024-05-30 MED ORDER — ENOXAPARIN SODIUM 40 MG/0.4ML IJ SOSY: 40.0000 mg | PREFILLED_SYRINGE | INTRAMUSCULAR | Status: DC

## 2024-05-30 MED ORDER — CHLORHEXIDINE GLUCONATE 0.12 % MT SOLN: 15.0000 mL | Freq: Once | OROMUCOSAL | Status: AC

## 2024-05-30 MED ORDER — CHLORHEXIDINE GLUCONATE 0.12 % MT SOLN
OROMUCOSAL | Status: AC
  Administered 2024-05-30: 15 mL via OROMUCOSAL
  Filled 2024-05-30: qty 15

## 2024-05-30 MED ORDER — FENTANYL CITRATE (PF) 250 MCG/5ML IJ SOLN
INTRAMUSCULAR | Status: DC | PRN
  Administered 2024-05-30: 50 ug via INTRAVENOUS
  Administered 2024-05-30: 100 ug via INTRAVENOUS
  Administered 2024-05-30 (×4): 50 ug via INTRAVENOUS

## 2024-05-30 MED ORDER — PHENYLEPHRINE HCL-NACL 20-0.9 MG/250ML-% IV SOLN
INTRAVENOUS | Status: DC | PRN
  Administered 2024-05-30: 30 ug/min via INTRAVENOUS

## 2024-05-30 MED ORDER — LACTATED RINGERS IV SOLN: INTRAVENOUS | Status: DC

## 2024-05-30 SURGICAL SUPPLY — 68 items
BLADE CLIPPER SURG (BLADE) ×2 IMPLANT
CANISTER SUCTION 3000ML PPV (SUCTIONS) ×2 IMPLANT
CLIP APPLIE ROT 10 11.4 M/L (STAPLE) IMPLANT
CLIP TI MEDIUM 6 (CLIP) ×2 IMPLANT
CNTNR URN SCR LID CUP LEK RST (MISCELLANEOUS) ×4 IMPLANT
CONN ST 1/4X3/8 BEN (MISCELLANEOUS) IMPLANT
CONN Y 3/8X3/8X3/8 BEN (MISCELLANEOUS) IMPLANT
COVER SURGICAL LIGHT HANDLE (MISCELLANEOUS) IMPLANT
DEFOGGER SCOPE WARM SEASHARP (MISCELLANEOUS) IMPLANT
DERMABOND ADVANCED .7 DNX12 (GAUZE/BANDAGES/DRESSINGS) IMPLANT
DRAIN CHANNEL 19F RND (DRAIN) IMPLANT
DRAIN CHANNEL 28F RND 3/8 FF (WOUND CARE) IMPLANT
DRAPE CV SPLIT W-CLR ANES SCRN (DRAPES) ×2 IMPLANT
DRAPE SURG ORHT 6 SPLT 77X108 (DRAPES) ×2 IMPLANT
ELECT BLADE 6.5 EXT (BLADE) ×2 IMPLANT
ELECTRODE REM PT RTRN 9FT ADLT (ELECTROSURGICAL) ×2 IMPLANT
FELT TEFLON 1X6 (MISCELLANEOUS) IMPLANT
GAUZE 4X4 16PLY ~~LOC~~+RFID DBL (SPONGE) ×2 IMPLANT
GAUZE SPONGE 4X4 12PLY STRL (GAUZE/BANDAGES/DRESSINGS) ×2 IMPLANT
GLOVE SS BIOGEL STRL SZ 7.5 (GLOVE) ×2 IMPLANT
GLOVE SURG SIGNA 7.5 PF LTX (GLOVE) ×4 IMPLANT
GOWN STRL REUS W/ TWL LRG LVL3 (GOWN DISPOSABLE) ×4 IMPLANT
GOWN STRL REUS W/ TWL XL LVL3 (GOWN DISPOSABLE) ×2 IMPLANT
HEMOSTAT SURGICEL 2X14 (HEMOSTASIS) IMPLANT
KIT BASIN OR (CUSTOM PROCEDURE TRAY) ×2 IMPLANT
KIT SUCTION CATH 14FR (SUCTIONS) IMPLANT
KIT TURNOVER KIT B (KITS) ×2 IMPLANT
NDL HYPO 25GX1X1/2 BEV (NEEDLE) ×2 IMPLANT
NDL SPNL 22GX7 QUINCKE BK (NEEDLE) IMPLANT
NEEDLE HYPO 25GX1X1/2 BEV (NEEDLE) ×2 IMPLANT
NEEDLE SPNL 22GX7 QUINCKE BK (NEEDLE) IMPLANT
PACK CHEST (CUSTOM PROCEDURE TRAY) ×2 IMPLANT
PAD ARMBOARD POSITIONER FOAM (MISCELLANEOUS) ×4 IMPLANT
POUCH ENDO CATCH II 15MM (MISCELLANEOUS) IMPLANT
SCISSORS LAP 5X35 DISP (ENDOMECHANICALS) IMPLANT
SEALANT PROGEL (MISCELLANEOUS) IMPLANT
SEALANT SURG COSEAL 4ML (VASCULAR PRODUCTS) IMPLANT
SEALANT SURG COSEAL 8ML (VASCULAR PRODUCTS) IMPLANT
SHEARS HARMONIC HDI 20CM (ELECTROSURGICAL) IMPLANT
SOLN 0.9% NACL POUR BTL 1000ML (IV SOLUTION) ×6 IMPLANT
SOLN STERILE WATER BTL 1000 ML (IV SOLUTION) ×2 IMPLANT
SOLUTION ANTFG W/FOAM PAD STRL (MISCELLANEOUS) ×2 IMPLANT
SPONGE INTESTINAL PEANUT (DISPOSABLE) IMPLANT
SPONGE T-LAP 18X18 ~~LOC~~+RFID (SPONGE) ×8 IMPLANT
SPONGE T-LAP 4X18 ~~LOC~~+RFID (SPONGE) ×2 IMPLANT
SPONGE TONSIL 1 RF SGL (DISPOSABLE) ×2 IMPLANT
SUT PDS AB 3-0 SH 27 (SUTURE) IMPLANT
SUT PROLENE 4-0 RB1 .5 CRCL 36 (SUTURE) IMPLANT
SUT SILK 1 MH (SUTURE) ×4 IMPLANT
SUT SILK 1 TIES 10X30 (SUTURE) ×2 IMPLANT
SUT SILK 2 0 SH (SUTURE) IMPLANT
SUT SILK 2 0SH CR/8 30 (SUTURE) IMPLANT
SUT SILK 3 0 SH 30 (SUTURE) IMPLANT
SUT SILK 3 0SH CR/8 30 (SUTURE) ×2 IMPLANT
SUT VIC AB 1 CTX36XBRD ANBCTR (SUTURE) ×2 IMPLANT
SUT VIC AB 2-0 CTX 36 (SUTURE) ×2 IMPLANT
SUT VIC AB 3-0 MH 27 (SUTURE) IMPLANT
SUT VIC AB 3-0 X1 27 (SUTURE) ×2 IMPLANT
SYR 10ML LL (SYRINGE) ×2 IMPLANT
SYR 20ML LL LF (SYRINGE) ×4 IMPLANT
SYSTEM BAG RETRIEVAL 10MM (BASKET) IMPLANT
SYSTEM SAHARA CHEST DRAIN ATS (WOUND CARE) ×2 IMPLANT
TAPE CLOTH 4X10 WHT NS (GAUZE/BANDAGES/DRESSINGS) ×2 IMPLANT
TOWEL GREEN STERILE (TOWEL DISPOSABLE) ×2 IMPLANT
TOWEL GREEN STERILE FF (TOWEL DISPOSABLE) ×2 IMPLANT
TRAY FOLEY MTR SLVR 16FR STAT (SET/KITS/TRAYS/PACK) ×2 IMPLANT
TROCAR XCEL BLADELESS 5X75MML (TROCAR) ×2 IMPLANT
TROCAR XCEL NON-BLD 5MMX100MML (ENDOMECHANICALS) IMPLANT

## 2024-05-30 NOTE — Plan of Care (Signed)
 Heparin gtt discontinued at 0500 11/6.

## 2024-05-30 NOTE — Transfer of Care (Signed)
 Immediate Anesthesia Transfer of Care Note  Patient: David [name]  Procedure(s) Performed: VIDEO ASSISTED THORACOSCOPY (VATS)/ LOBECTOMY (Chest) BLOCK, NERVE, INTERCOSTAL (Left: Chest)  Patient Location: PACU  Anesthesia Type:General  Level of Consciousness: awake, oriented, and drowsy  Airway & Oxygen Therapy: Patient connected to face mask oxygen  Post-op Assessment: Report given to RN, Post -op Vital signs reviewed and stable, and Patient moving all extremities  Post vital signs: Reviewed and stable  Last Vitals:  Vitals Value Taken Time  BP 156/65 05/30/24 17:13  Temp 36.7 C 05/30/24 17:13  Pulse 75 05/30/24 17:27  Resp 17 05/30/24 17:27  SpO2 99 % 05/30/24 17:27  Vitals shown include unfiled device data.  Last Pain:  Vitals:   05/30/24 1224  TempSrc:   PainSc: 0-No pain         Complications: No notable events documented.

## 2024-05-30 NOTE — Brief Op Note (Signed)
 05/28/2024 - 05/30/2024  5:01 PM  PATIENT:  David [name]  73 y.o. male  PRE-OPERATIVE DIAGNOSIS:  Large mediastinal mass  POST-OPERATIVE DIAGNOSIS:  Large mediastinal mass  PROCEDURE:  LEFT VIDEO ASSISTED THORACOSCOPY (VATS) FOR BIOPSY OF MEDIASTINAL MASS BLOCK  SURGEON:  Surgeons and Role:    Kerrin Elspeth BROCKS, MD - Primary  PHYSICIAN ASSISTANT: Kyla Donald PA-C  ANESTHESIA:   general  EBL:  25 mL   BLOOD ADMINISTERED:none  DRAINS: 19 Blake drain placed in the left chest   LOCAL MEDICATIONS USED:  OTHER Exparel   SPECIMEN:  Source of Specimen:  Mediastinal mass  DISPOSITION OF SPECIMEN:  Pathology-frozen and permanent  COUNTS CORRECT:  YES  DICTATION: .Dragon Dictation  PLAN OF CARE: Admit to inpatient   PATIENT DISPOSITION:  PACU - hemodynamically stable.   Delay start of Pharmacological VTE agent (>24hrs) due to surgical blood loss or risk of bleeding: yes

## 2024-05-30 NOTE — Assessment & Plan Note (Addendum)
 Anterior mediastinal mass, biopsy planned for today, possible transfer to [facility] s/p biopsy. ID recommend removal of PICC line, messaged ID about keeping PICC line until tomorrow. Daughter requested FMLA for work, confirmed with nursing they will print and provide daughter with copy at bedside.   - CT Surg biopsy planned for today, NPO  - PICC line removal 11/07 once completed ctx/dapto for discitis  - Heparin gtt discontinued at 0500 - f/u Heme/Onc recs - f/u RD for malnutrition  - Phos + Mag add-on to r/o refeeding syndrome before repleting K+  - LR maintenance 125 mL/hr  - PTH-RP  - PRN zofran  for N&V - AM: BMP, CBC - Fall precautions

## 2024-05-30 NOTE — Progress Notes (Signed)
 Daily Progress Note Intern Pager: [phone]  Patient name: David [name] Medical record number: [id number] Date of birth: 1951/06/24 Age: 73 y.o. Gender: male  Primary Care Provider: [facility], [address] Consultants: Heme/Onc, CT surgery Code Status: Full code, confirmed with patient Preferred Emergency Contact: Contact Information     Name Relation Home Work Mobile   David [name] Daughter [phone]  [phone]      Other Contacts   None on File      Pt Overview and Major Events to Date:  11/04: Admitted to FMTS  Assessment and Plan: David [name] is a 73 year old male presenting with new onset aphasia and generalized weakness found to have a new mediastinal mass with hypercalcemia concerning for malignancy.  Patient is scheduled for biopsy of the mass and may transfer to [facility] pending heme-onc evaluation postbiopsy. Assessment & Plan Hypercalcemia Mediastinal mass Anterior mediastinal mass, biopsy planned for today, possible transfer to [facility] s/p biopsy. ID recommend removal of PICC line, messaged ID about keeping PICC line until tomorrow. Daughter requested FMLA for work, confirmed with nursing they will print and provide daughter with copy at bedside.   - CT Surg biopsy planned for today, NPO  - PICC line removal 11/07 once completed ctx/dapto for discitis  - Heparin gtt discontinued at 0500 - f/u Heme/Onc recs - f/u RD for malnutrition  - Phos + Mag add-on to r/o refeeding syndrome before repleting K+  - LR maintenance 125 mL/hr  - PTH-RP  - PRN zofran  for N&V - AM: BMP, CBC - Fall precautions Discitis of cervical region Per ID, restart antibiotics, will complete 11/7 - Continue IV ctx/dapto (11/6 - ) - PRN home tramadol for pain control  - Miralax and Sena on for BM  Paroxysmal atrial fibrillation (HCC) Noted on long term monitoring outpatient recently. Scheduled for follow up with cards later this month to discuss management.  - continuous tele  -  discuss anticoagulation to reduce risk of stroke    FEN/GI: NPO  PPx: Lovenox Dispo: Telemetry  Subjective:  Patient found in bed with daughter at bedside.  Per daughter, intermittent confusion yesterday, though today patient is doing better.  Patient is A&Ox4.  Family consulted on Eliquis and confirmed was discontinued early this morning for biopsy planned today.  Objective: Temp:  [98.1 F (36.7 C)-98.6 F (37 C)] 98.6 F (37 C) (11/06 0541) Pulse Rate:  [72-91] 91 (11/06 0541) Resp:  [18-20] 18 (11/06 0541) BP: (140-159)/(54-70) 150/70 (11/06 0541) SpO2:  [95 %-98 %] 95 % (11/06 0541) Weight:  [79.1 kg] 79.1 kg (11/06 0500)  Physical Exam: General: Chronically ill-appearing, no acute distress, cachetic Cardio: Regular rate, regular rhythm, no murmurs on exam. Pulm: Clear, no wheezing, no crackles. No increased work of breathing Abdominal: bowel sounds present, soft, non-tender, non-distended Extremities: no peripheral edema  Neuro: alert and oriented x4, speech normal in content, no facial asymmetry, strength intact and equal bilaterally in UE and LE, pupils equal and reactive to light.   Laboratory: Most recent CBC Lab Results  Component Value Date   WBC 9.1 05/30/2024   HGB 9.4 (L) 05/30/2024   HCT 29.4 (L) 05/30/2024   MCV 87.2 05/30/2024   PLT 228 05/30/2024   Most recent BMP    Latest Ref Rng & Units 05/30/2024    4:00 AM  BMP  Glucose 70 - 99 mg/dL 898   BUN 8 - 23 mg/dL 12   Creatinine 9.38 - 1.24 mg/dL 9.07   Sodium 864 -  145 mmol/L 138   Potassium 3.5 - 5.1 mmol/L 3.1   Chloride 98 - 111 mmol/L 103   CO2 22 - 32 mmol/L 24   Calcium 8.9 - 10.3 mg/dL 9.9      Imaging/Diagnostic Tests: CT-scan of abdomen and pelvis: Radiology finding -enlarged left periaortic and left external iliac chain lymph nodes most consistent with metastatic disease.   CT-scan of the chest: Radiology finding-large anterior mediastinal mass approximately 5.0 x 8.0 x 11.4 cm  partially encasing the proximal right brachiocephalic and left common carotid arteries, with marked narrowing of the left brachiocephalic vein and compression of the SVC.  Enlarged mediastinal lymph nodes including high paratracheal and right hilar nodes.  Subcentimeter thyroid  nodules and a 15 mm peripherally calcified thyroid  isthmus nodule.  Elodie Palma, MD 05/30/2024, 8:00 AM  PGY-1, [facility] FPTS Intern pager: [phone], text pages welcome Secure chat group [facility] Teaching Service

## 2024-05-30 NOTE — Anesthesia Procedure Notes (Signed)
 Arterial Line Insertion Start/End11/12/2023 1:00 PM, 05/30/2024 1:15 PM Performed by: Zelphia Norleen HERO, CRNA  Patient location: Pre-op. Preanesthetic checklist: patient identified, IV checked, site marked, risks and benefits discussed, surgical consent, monitors and equipment checked, pre-op evaluation, timeout performed and anesthesia consent Lidocaine  1% used for infiltration Right, radial was placed Catheter size: 20 G Hand hygiene performed  and maximum sterile barriers used   Attempts: 1 Procedure performed using ultrasound to evaluate access site. Ultrasound Notes:relevant anatomy identified, ultrasound used to visualize needle entry, vessel patent under ultrasound and image(s) printed for medical record. Following insertion, dressing applied and Biopatch. Post procedure assessment: normal and unchanged  Patient tolerated the procedure well with no immediate complications.

## 2024-05-30 NOTE — Assessment & Plan Note (Addendum)
 Per ID, restart antibiotics, will complete 11/7 - Continue IV ctx/dapto (11/6 - ) - PRN home tramadol for pain control  - Miralax and Sena on for BM

## 2024-05-30 NOTE — Plan of Care (Signed)
 FMTS Brief Progress Note  S: Patient was seen and evaluated at bedside.  Multiple family members including daughter at bedside.  Patient is comfortably lying supine on hospital bed and interactive and laughing with family members.  He has some reported tenderness at biopsy site to left lateral chest but no other complaints at this time.  Very minimal serosanguineous output from chest tube.  O: BP (!) 134/57 (BP Location: Left Arm)   Pulse 74   Temp 98.3 F (36.8 C) (Oral)   Resp 20   Ht 6' (1.829 m)   Wt 73.9 kg   SpO2 97%   BMI 22.10 kg/m   GEN: Comfortable appearing male in hospital bed interactive with family members and myself CV: RRR, no murmurs, 2+radial pulses RESP: CTAB, normal work of breathing, biopsy site to mid left lateral chest covered with clean gauze and bandage without evidence of leakage of blood or discharge with mild surrounding tenderness to palpation  A/P: - Continue plan as noted in day team progress note - Pain management with Tylenol  1000 mg every 6 hours scheduled, Toradol  15 mg IM every 6 hours scheduled, tramadol 50 mg every 6 hours as needed for mild pain, oxycodone  5 to 10 mg every 4 hours as needed for moderate pain, and IV fentanyl  25 to 50 mcg every 2 hours as needed for severe pain - Orders reviewed. Labs for AM, which were adjusted as needed.  - If condition changes, plan includes pain management, page and evaluation by FMTS.   Lupie Credit, DO 05/30/2024, 8:44 PM PGY-1, [facility] Night Resident  Please page [phone] with questions.

## 2024-05-30 NOTE — Plan of Care (Signed)
  Problem: Education: Goal: Knowledge of General Education information will improve Description: Including pain rating scale, medication(s)/side effects and non-pharmacologic comfort measures Outcome: Not Applicable   Problem: Health Behavior/Discharge Planning: Goal: Ability to manage health-related needs will improve Outcome: Not Applicable   Problem: Clinical Measurements: Goal: Ability to maintain clinical measurements within normal limits will improve Outcome: Not Applicable Goal: Will remain free from infection Outcome: Not Applicable Goal: Diagnostic test results will improve Outcome: Not Applicable Goal: Respiratory complications will improve Outcome: Not Applicable Goal: Cardiovascular complication will be avoided Outcome: Not Applicable   Problem: Activity: Goal: Risk for activity intolerance will decrease Outcome: Not Applicable   Problem: Nutrition: Goal: Adequate nutrition will be maintained Outcome: Not Applicable   Problem: Coping: Goal: Level of anxiety will decrease Outcome: Not Applicable   Problem: Elimination: Goal: Will not experience complications related to bowel motility Outcome: Not Applicable Goal: Will not experience complications related to urinary retention Outcome: Not Applicable   Problem: Safety: Goal: Ability to remain free from injury will improve Outcome: Not Applicable   Problem: Skin Integrity: Goal: Risk for impaired skin integrity will decrease Outcome: Not Applicable

## 2024-05-30 NOTE — Assessment & Plan Note (Deleted)
 Noted on long term monitoring outpatient recently. Scheduled for follow up with cards later this month to discuss management.  - continuous tele  - discuss anticoagulation to reduce risk of stroke

## 2024-05-30 NOTE — Op Note (Signed)
 NAMETAIWAN, TALCOTT MEDICAL RECORD NO: [id number] ACCOUNT NO: [id number] DATE OF BIRTH: [birth year] FACILITY: [facility] LOCATION: [facility] PHYSICIAN: Elspeth BROCKS. Kerrin, MD  Operative Report   DATE OF PROCEDURE: 05/30/2024   PREOPERATIVE DIAGNOSIS: Anterior mediastinal mass.  POSTOPERATIVE DIAGNOSIS: Anterior mediastinal mass.  PROCEDURE: Left VATS biopsy of anterior mediastinal mass.  SURGEON: Elspeth BROCKS. Kerrin, MD  ASSISTANT: Kyla Donald, PA  ANESTHESIA: General.  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  CLINICAL NOTE: Mr. Blakeman is a 73 year old gentleman who was found to have a large anterior mediastinal mass encasing the great vessels. He was admitted and advised to undergo biopsy of the mass, either Chamberlain procedure or VATS. After examining his anatomy, it was felt that VATS would be a better option. The indications, risks, benefits, and alternatives were discussed in detail with the patient. He understood and accepted the risks and agreed to proceed.  OPERATIVE NOTE: Mr. Landy was brought to the operating room on 05/30/2024. He had induction of general anesthesia and was intubated with a double-lumen endotracheal tube. Intravenous antibiotics were administered. Sequential compression devices were  placed on the calves for DVT prophylaxis. He was placed in a right lateral decubitus position. Single lung ventilation of the right lung was initiated and was tolerated well throughout the procedure. The left lung was prepped and draped in the usual sterile fashion.  A time-out was performed. A solution containing 20 mL of liposomal bupivacaine  and 30 mL of 0.5% bupivacaine  was prepared. This solution was used for local at the incision sites. A small incision was made in the 7th interspace in the midaxillary line. A 5-mm port was inserted and the thoracoscope was advanced into the chest. There was  good isolation of the left lung. The mass was immediately visible. A small working incision was made in the 4th interspace anterolaterally through the skin and subcutaneous tissue. The muscle was separated and the intercostal muscles were divided. The bottom edge of the mass was grasped and a biopsy was taken using electrocautery for hemostasis. The first biopsy was sent for frozen section. 2 additional larger  biopsies were taken for permanent pathology and flow cytometry. A 19-French Blake drain was inserted through the original port incision. A final inspection was made of the biopsy site and there was no ongoing bleeding. Dual lung ventilation was resumed. The wound was closed in standard fashion. All sponge, needle, and instrument counts were correct at the end of the procedure. The patient was extubated in the operating room and taken to the post-anesthetic care unit in good condition.    SUJ D: 05/30/2024 4:59:10 pm T: 05/30/2024 8:33:00 pm  JOB: [id number]/ [id number]   Addendum  Corrected spelling of Donielle Zimmerman's first name.  Experienced assistance was necessary for this case.  Kyla Donald assisted with camera management, retraction of tissues, suctioning and wound closure  Benicio Manna C. Kerrin, MD [facility] [phone]

## 2024-05-30 NOTE — Discharge Instructions (Addendum)
 Dear David [name],   Thank you so much for allowing us  to be part of your care!  You were admitted to [facility] for difficulty swallowing, weakness, and confusion. You were found to have high calcium levels with a mass in your chest. A biopsy of the mass was removed and you have been recovering well with stabilization of your calcium levels with fluids. Please follow up for the results of the biopsy results outpatient. A foley catheter has been placed due to urinary retention which was removed on day of discharge, please follow up with [facility] outpatient.    POST-HOSPITAL & CARE INSTRUCTIONS Please go to appointment at [facility] for removal of your Foley Catheter on 12/17 at 12:30 PM Please let PCP/Specialists know of any changes that were made.  Please see medications section of this packet for any medication changes.   DOCTOR'S APPOINTMENT & FOLLOW UP CARE INSTRUCTIONS  Future Appointments  Date Time Provider Location Address  07/10/2024  12:30 PM Donnice Brooks, MD [facility] [address] [place], [place], [place] [address]   Future Appointments  Date Time Provider Department Center  06/11/2024  2:30 PM Rutha Manuelita HERO, PA-C [facility]    RETURN PRECAUTIONS:   Take care and be well!  Family Medicine Teaching Service  [place]  [facility]  [address] [place], [place] [address] [phone]   Video-Assisted Thoracic Surgery, Care After After a thoracic surgery, it's common to have some pain and soreness in your chest. Or pain when you breathe in or cough. You may also have: Trouble pooping. Tiredness. Trouble sleeping. Follow these instructions at home: Medicines Take your medicines only as told. If you were given antibiotics, take them as told. Do not stop taking them even if you start to feel better. If you have pain, take pain medicine before your pain gets very bad. This is important. Doing this will  help you breathe and cough more comfortably. You may need to take steps to help treat or prevent trouble pooping (constipation), such as: Taking medicines to help you poop. Eating foods high in fiber, like beans, whole grains, and fresh fruits and vegetables. Drinking more fluids as told. Ask your health care provider if it's safe to drive or use machines while taking your medicine. Incision care  Take care of the cuts in your chest as told. Make sure you: Wash your hands with soap and water for at least 20 seconds before and after you change your bandage. If you can't use soap and water, use hand sanitizer. Change your bandage. Leave stitches or skin glue alone. Leave tape strips alone unless you're told to take them off. You may trim the edges of the tape strips if they curl up. Check the cuts on your chest every day for signs of infection. Check for: Redness, swelling, or pain. Fluid or blood. Warmth. Pus or a bad smell. Keep your bandage clean and dry until it is taken off. Activity If you were given a sedative, do not drive or use machines until you're told it's safe. A sedative can make you sleepy. Rest as told. Get up to take short walks at least every 2 hours during the day. This helps you breathe better and keeps your blood flowing. Ask for help if you feel weak or unsteady. Avoid activities that use your chest muscles for at least 3-4 weeks. Ask if it's OK for you to lift. Do not take baths,  swim, or use a hot tub until you're told it's OK. Ask if you can shower. Ask what things are safe for you to do at home. Ask when you can go back to work or school. Preventing lung infection  Take deep breaths and cough often. This opens your lungs and helps clear mucus. Doing this helps prevent lung infection (pneumonia). Use an incentive spirometer if told. This tool shows how much you fill your lungs with each breath. Coughing may hurt less if you try to support your chest. Try one of  these when you cough: Hold a pillow on your chest. Place both hands flat on your cut or cuts from surgery. Do not smoke, vape, or use nicotine or tobacco. Avoid secondhand smoke. Stay away when people are smoking. General instructions If you have a chest tube, care for it as told. A chest tube is also called a drain. Do not travel by airplane during the 2 weeks after your chest tube is taken out, or until your doctor says that this is safe. Your provider may give you more instructions. Make sure you know what you can and can't do. Contact a doctor if: You have any signs of infection in any of your cuts from surgery. Your skin, lips, or fingernails become blue. You have fast breathing. You make high-pitched whistling sounds when you breathe (wheeze). Get help right away if: You have fast, pounding heartbeat. You cough up blood or pus. You are short of breath. You have trouble breathing or shortness of breath that doesn't go away when you rest. You have sudden chest pain that doesn't go away when you rest. These symptoms may be an emergency. Call 911 right away. Do not wait to see if the symptoms will go away. Do not drive yourself to the hospital. This information is not intended to replace advice given to you by your health care provider. Make sure you discuss any questions you have with your health care provider. Document Revised: 01/17/2023 Document Reviewed: 01/17/2023 Elsevier Patient Education  2024 [employer].  Information on my medicine - ELIQUIS (apixaban)  Why was Eliquis prescribed for you? Eliquis was prescribed for you to reduce the risk of a blood clot forming that can cause a stroke if you have a medical condition called atrial fibrillation (a type of irregular heartbeat).  What do You need to know about Eliquis ? Take your Eliquis TWICE DAILY - one tablet in the morning and one tablet in the evening with or without food. If you have difficulty swallowing the  tablet whole please discuss with your pharmacist how to take the medication safely.  Take Eliquis exactly as prescribed by your doctor and DO NOT stop taking Eliquis without talking to the doctor who prescribed the medication.  Stopping may increase your risk of developing a stroke.  Refill your prescription before you run out.  After discharge, you should have regular check-up appointments with your healthcare provider that is prescribing your Eliquis.  In the future your dose may need to be changed if your kidney function or weight changes by a significant amount or as you get older.  What do you do if you miss a dose? If you miss a dose, take it as soon as you remember on the same day and resume taking twice daily.  Do not take more than one dose of ELIQUIS at the same time to make up a missed dose.  Important Safety Information A possible side effect of Eliquis is  bleeding. You should call your healthcare provider right away if you experience any of the following: Bleeding from an injury or your nose that does not stop. Unusual colored urine (red or dark brown) or unusual colored stools (red or black). Unusual bruising for unknown reasons. A serious fall or if you hit your head (even if there is no bleeding).  Some medicines may interact with Eliquis and might increase your risk of bleeding or clotting while on Eliquis. To help avoid this, consult your healthcare provider or pharmacist prior to using any new prescription or non-prescription medications, including herbals, vitamins, non-steroidal anti-inflammatory drugs (NSAIDs) and supplements.  This website has more information on Eliquis (apixaban): http://www.eliquis.com/eliquis/home

## 2024-05-30 NOTE — Plan of Care (Signed)
 Problem: Education: Goal: Knowledge of General Education information will improve Description: Including pain rating scale, medication(s)/side effects and non-pharmacologic comfort measures Outcome: Not Applicable   Problem: Health Behavior/Discharge Planning: Goal: Ability to manage health-related needs will improve Outcome: Not Applicable   Problem: Clinical Measurements: Goal: Ability to maintain clinical measurements within normal limits will improve Outcome: Not Applicable Goal: Will remain free from infection Outcome: Not Applicable Goal: Diagnostic test results will improve Outcome: Not Applicable Goal: Respiratory complications will improve Outcome: Not Applicable Goal: Cardiovascular complication will be avoided Outcome: Not Applicable   Problem: Activity: Goal: Risk for activity intolerance will decrease Outcome: Not Applicable   Problem: Nutrition: Goal: Adequate nutrition will be maintained Outcome: Not Applicable   Problem: Coping: Goal: Level of anxiety will decrease Outcome: Not Applicable   Problem: Elimination: Goal: Will not experience complications related to bowel motility Outcome: Not Applicable Goal: Will not experience complications related to urinary retention Outcome: Not Applicable   Problem: Safety: Goal: Ability to remain free from injury will improve Outcome: Not Applicable   Problem: Skin Integrity: Goal: Risk for impaired skin integrity will decrease Outcome: Not Applicable

## 2024-05-30 NOTE — Anesthesia Preprocedure Evaluation (Signed)
 Anesthesia Evaluation  Patient identified by MRN, date of birth, ID band Patient awake    Reviewed: Allergy & Precautions, NPO status , Patient's Chart, lab work & pertinent test results  History of Anesthesia Complications Negative for: history of anesthetic complications  Airway Mallampati: I  TM Distance: >3 FB Neck ROM: Full    Dental  (+) Teeth Intact, Dental Advisory Given   Pulmonary    breath sounds clear to auscultation       Cardiovascular  Rhythm:Regular Rate:Normal     Neuro/Psych    GI/Hepatic   Endo/Other    Renal/GU      Musculoskeletal   Abdominal   Peds  Hematology   Anesthesia Other Findings   Reproductive/Obstetrics                              Anesthesia Physical Anesthesia Plan  ASA: 2  Anesthesia Plan: General   Post-op Pain Management:    Induction: Intravenous  PONV Risk Score and Plan: 2 and Ondansetron , Dexamethasone and Treatment may vary due to age or medical condition  Airway Management Planned: Double Lumen EBT  Additional Equipment: ClearSight  Intra-op Plan:   Post-operative Plan: Extubation in OR  Informed Consent:      Dental advisory given  Plan Discussed with: CRNA  Anesthesia Plan Comments:         Anesthesia Quick Evaluation

## 2024-05-30 NOTE — Assessment & Plan Note (Signed)
 Noted on long term monitoring outpatient recently. Scheduled for follow up with cards later this month to discuss management.  - continuous tele  - discuss anticoagulation to reduce risk of stroke

## 2024-05-30 NOTE — Anesthesia Procedure Notes (Signed)
 Procedure Name: Intubation Date/Time: 05/30/2024 4:07 PM  Performed by: Jerl Donald LABOR, CRNAPre-anesthesia Checklist: Patient identified, Emergency Drugs available, Suction available and Patient being monitored Patient Re-evaluated:Patient Re-evaluated prior to induction Oxygen Delivery Method: Circle System Utilized Preoxygenation: Pre-oxygenation with 100% oxygen Induction Type: IV induction Ventilation: Mask ventilation without difficulty Laryngoscope Size: Glidescope and 3 Grade View: Grade I Tube type: Oral Endobronchial tube: Left, EBT position confirmed by auscultation and Double lumen EBT and 41 Fr Number of attempts: 1 Airway Equipment and Method: Stylet and Video-laryngoscopy Placement Confirmation: ETT inserted through vocal cords under direct vision, positive ETCO2 and breath sounds checked- equal and bilateral Secured at: 30 cm Tube secured with: Tape Dental Injury: Teeth and Oropharynx as per pre-operative assessment

## 2024-05-30 NOTE — Interval H&P Note (Signed)
 History and Physical Interval Note: Patient seen and examined, records reviewed, imagers reviewed. Large mediastinal mass Needs tissue diagnosis to guide therapy. Anatomy not great for [place], will proceed with left VATS for biopsy Patient aware of risks and benefits  05/30/2024 3:20 PM  Braylynn Ghan  has presented today for surgery, with the diagnosis of mediastinal mass.  The various methods of treatment have been discussed with the patient and family. After consideration of risks, benefits and other options for treatment, the patient has consented to  Procedure(s) with comments: VIDEO ASSISTED THORACOSCOPY (VATS)/ LOBECTOMY (N/A) - [place] OR VATS FOR BIOPSY OF MEDIASTINAL MASS as a surgical intervention.  The patient's history has been reviewed, patient examined, no change in status, stable for surgery.  I have reviewed the patient's chart and labs.  Questions were answered to the patient's satisfaction.     Elspeth JAYSON Millers

## 2024-05-31 ENCOUNTER — Inpatient Hospital Stay (HOSPITAL_COMMUNITY)

## 2024-05-31 ENCOUNTER — Encounter (HOSPITAL_COMMUNITY): Payer: Self-pay | Admitting: Thoracic Surgery (Cardiothoracic Vascular Surgery)

## 2024-05-31 DIAGNOSIS — E43 Unspecified severe protein-calorie malnutrition: Secondary | ICD-10-CM | POA: Insufficient documentation

## 2024-05-31 DIAGNOSIS — R39198 Other difficulties with micturition: Secondary | ICD-10-CM | POA: Insufficient documentation

## 2024-05-31 DIAGNOSIS — R339 Retention of urine, unspecified: Secondary | ICD-10-CM

## 2024-05-31 DIAGNOSIS — M4640 Discitis, unspecified, site unspecified: Secondary | ICD-10-CM | POA: Diagnosis not present

## 2024-05-31 DIAGNOSIS — G8912 Acute post-thoracotomy pain: Secondary | ICD-10-CM

## 2024-05-31 DIAGNOSIS — J9859 Other diseases of mediastinum, not elsewhere classified: Secondary | ICD-10-CM | POA: Diagnosis not present

## 2024-05-31 DIAGNOSIS — Z902 Acquired absence of lung [part of]: Secondary | ICD-10-CM

## 2024-05-31 LAB — COMPREHENSIVE METABOLIC PANEL WITH GFR
ALT: 10 U/L (ref 0–44)
AST: 13 U/L — ABNORMAL LOW (ref 15–41)
Albumin: 2.6 g/dL — ABNORMAL LOW (ref 3.5–5.0)
Alkaline Phosphatase: 111 U/L (ref 38–126)
Anion gap: 13 (ref 5–15)
BUN: 21 mg/dL (ref 8–23)
CO2: 21 mmol/L — ABNORMAL LOW (ref 22–32)
Calcium: 8.9 mg/dL (ref 8.9–10.3)
Chloride: 100 mmol/L (ref 98–111)
Creatinine, Ser: 0.98 mg/dL (ref 0.61–1.24)
GFR, Estimated: >60 mL/min (ref >60)
Glucose, Bld: 106 mg/dL — ABNORMAL HIGH (ref 70–99)
Potassium: 3.7 mmol/L (ref 3.5–5.1)
Sodium: 134 mmol/L — ABNORMAL LOW (ref 135–145)
Total Bilirubin: 0.7 mg/dL (ref 0.0–1.2)
Total Protein: 5.8 g/dL — ABNORMAL LOW (ref 6.5–8.1)

## 2024-05-31 LAB — BASIC METABOLIC PANEL WITH GFR
Anion gap: 11 (ref 5–15)
BUN: 17 mg/dL (ref 8–23)
CO2: 24 mmol/L (ref 22–32)
Calcium: 9.3 mg/dL (ref 8.9–10.3)
Chloride: 103 mmol/L (ref 98–111)
Creatinine, Ser: 0.93 mg/dL (ref 0.61–1.24)
GFR, Estimated: >60 mL/min (ref >60)
Glucose, Bld: 114 mg/dL — ABNORMAL HIGH (ref 70–99)
Potassium: 3.7 mmol/L (ref 3.5–5.1)
Sodium: 138 mmol/L (ref 135–145)

## 2024-05-31 LAB — CBC
HCT: 28.7 % — ABNORMAL LOW (ref 39.0–52.0)
Hemoglobin: 9.4 g/dL — ABNORMAL LOW (ref 13.0–17.0)
MCH: 28.3 pg (ref 26.0–34.0)
MCHC: 32.8 g/dL (ref 30.0–36.0)
MCV: 86.4 fL (ref 80.0–100.0)
Platelets: 242 K/uL (ref 150–400)
RBC: 3.32 MIL/uL — ABNORMAL LOW (ref 4.22–5.81)
RDW: 16.1 % — ABNORMAL HIGH (ref 11.5–15.5)
WBC: 14.1 K/uL — ABNORMAL HIGH (ref 4.0–10.5)
nRBC: 0 % (ref 0.0–0.2)

## 2024-05-31 LAB — PHOSPHORUS
Phosphorus: 4 mg/dL (ref 2.5–4.6)
Phosphorus: 2.5 mg/dL (ref 2.5–4.6)

## 2024-05-31 LAB — MAGNESIUM
Magnesium: 1.7 mg/dL (ref 1.7–2.4)
Magnesium: 2.1 mg/dL (ref 1.7–2.4)

## 2024-05-31 MED ORDER — DAPTOMYCIN-SODIUM CHLORIDE 700-0.9 MG/100ML-% IV SOLN
700.0000 mg | Freq: Once | INTRAVENOUS | Status: AC
  Administered 2024-06-01: 700 mg via INTRAVENOUS
  Filled 2024-05-31: qty 100

## 2024-05-31 MED ORDER — ENOXAPARIN SODIUM 80 MG/0.8ML IJ SOSY
70.0000 mg | PREFILLED_SYRINGE | Freq: Two times a day (BID) | INTRAMUSCULAR | Status: DC
  Administered 2024-05-31 – 2024-06-01 (×2): 70 mg via SUBCUTANEOUS
  Filled 2024-05-31 (×2): qty 0.7

## 2024-05-31 MED ORDER — MIRTAZAPINE 7.5 MG PO TABS
15.0000 mg | ORAL_TABLET | Freq: Every day | ORAL | Status: DC
  Administered 2024-05-31 – 2024-06-05 (×6): 15 mg via ORAL
  Filled 2024-05-31 (×6): qty 2

## 2024-05-31 MED ORDER — THIAMINE MONONITRATE 100 MG PO TABS
100.0000 mg | ORAL_TABLET | Freq: Every day | ORAL | Status: AC
  Administered 2024-05-31 – 2024-06-06 (×7): 100 mg via ORAL
  Filled 2024-05-31 (×7): qty 1

## 2024-05-31 MED ORDER — ADULT MULTIVITAMIN W/MINERALS CH
1.0000 | ORAL_TABLET | Freq: Every day | ORAL | Status: DC
  Administered 2024-05-31 – 2024-06-06 (×7): 1 via ORAL
  Filled 2024-05-31 (×7): qty 1

## 2024-05-31 MED ORDER — TAMSULOSIN HCL 0.4 MG PO CAPS
0.4000 mg | ORAL_CAPSULE | Freq: Every day | ORAL | Status: DC
  Administered 2024-05-31 – 2024-06-06 (×7): 0.4 mg via ORAL
  Filled 2024-05-31 (×7): qty 1

## 2024-05-31 MED ORDER — MAGNESIUM SULFATE 2 GM/50ML IV SOLN
2.0000 g | Freq: Once | INTRAVENOUS | Status: AC
  Administered 2024-05-31: 2 g via INTRAVENOUS
  Filled 2024-05-31: qty 50

## 2024-05-31 NOTE — Anesthesia Postprocedure Evaluation (Signed)
 Anesthesia Post Note  Patient: David [name]  Procedure(s) Performed: VIDEO ASSISTED THORACOSCOPY (VATS)/ LOBECTOMY (Chest) BLOCK, NERVE, INTERCOSTAL (Left: Chest)     Patient location during evaluation: PACU Anesthesia Type: General Level of consciousness: awake and alert Pain management: pain level controlled Vital Signs Assessment: post-procedure vital signs reviewed and stable Respiratory status: spontaneous breathing, nonlabored ventilation, respiratory function stable and patient connected to nasal cannula oxygen Cardiovascular status: blood pressure returned to baseline and stable Postop Assessment: no apparent nausea or vomiting Anesthetic complications: no   No notable events documented.  Last Vitals:  Vitals:   05/31/24 0800 05/31/24 0842  BP: (!) 112/51 (!) 112/51  Pulse: 69 72  Resp: 16   Temp: 36.9 C   SpO2: 96%     Last Pain:  Vitals:   05/31/24 1239  TempSrc:   PainSc: 3                  [name]

## 2024-05-31 NOTE — Assessment & Plan Note (Addendum)
-   f/u bladder scan - Flomax

## 2024-05-31 NOTE — Progress Notes (Signed)
 Chest tube removed with no complications. Will continue to monitor.

## 2024-05-31 NOTE — Progress Notes (Signed)
 Physical Therapy Treatment Patient Details Name: David [name] MRN: [id number] DOB: Oct 21, 1950 Today's Date: 05/31/2024   History of Present Illness 73 y.o. male presenting to ED 10/21 with generalized weakness and worsening aphasia for the last 72 hours. 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 Calcium was found to be 13.1. 11/6 s/p VATS with bx of L mediastinal mass. 11/7 L CT removed in AM. PMH: s/p C5-C6-C7 decompressive laminectomy with evacuation of cervical epidural abscess 9/26, HTN, GERD, colon polyp, and skin cancer.    PT Comments  Pt received in supine, agreeable to therapy session and with good participation and fair tolerance for transfer training. Pt quick to fatigue with pre-gait standing tasks and pivot to chair, and appears to be mainly limited due to c/o pain at L flank/ribs and sternal areas, RN notified. Pt SpO2 WFL on RA standing and seated in chair, SpO2 92% and above, RN notified pt on RA at end of session. Pt continues to benefit from PT services to progress toward functional mobility goals, plan to work on gait training with RW vs rollator and chair follow for safety in next session.     If plan is discharge home, recommend the following: A little help with walking and/or transfers;A little help with bathing/dressing/bathroom;Assistance with cooking/housework;Direct supervision/assist for medications management;Direct supervision/assist for financial management;Assist for transportation;Help with stairs or ramp for entrance;Supervision due to cognitive status   Can travel by private vehicle        Equipment Recommendations  None recommended by PT    Recommendations for Other Services       Precautions / Restrictions Precautions Precautions: Fall Recall of Precautions/Restrictions: Intact Precaution/Restrictions Comments: recent fall day PTA, watch O2; L flank and midsternal pain after procedure and tube  removal. Restrictions Weight Bearing Restrictions Per Provider Order: No     Mobility  Bed Mobility Overal bed mobility: Needs Assistance Bed Mobility: Rolling, Supine to Sit Rolling: Contact guard assist (partial)   Supine to sit: Mod assist, HOB elevated, Used rails     General bed mobility comments: Pt able to manage LE off bed, needs modA for bringing trunk to upright, attempted to roll to L side prior to sitting up but too much pain to L flank/sternum despite guarding with lung pillow, so needing heavy modA to raise trunk    Transfers Overall transfer level: Needs assistance Equipment used: 1 person hand held assist Transfers: Sit to/from Stand, Bed to chair/wheelchair/BSC Sit to Stand: Mod assist   Step pivot transfers: Mod assist, +2 safety/equipment       General transfer comment: HHA and sidesteps toward chair on his L side; SpO2 WFL on RA while pt mobilizing, so left him on RA in chair, RN/family notified and agreeable. Pain with pivot transfer and pre-gait tasks limiting his standing tolerance.    Ambulation/Gait Ambulation/Gait assistance: Mod assist, +2 safety/equipment   Assistive device: 1 person hand held assist       Pre-gait activities: standing hip flexion x10 reps in front of chair after step pivot prior to pt c/o pain/fatigue and requesting to sit down     Stairs             Wheelchair Mobility     Tilt Bed    Modified Rankin (Stroke Patients Only)       Balance Overall balance assessment: Needs assistance Sitting-balance support: Bilateral upper extremity supported, Feet supported Sitting balance-Leahy Scale: Fair   Postural control: Posterior lean Standing balance  support: Single extremity supported, During functional activity Standing balance-Leahy Scale: Poor Standing balance comment: consistent modA with HHA for pre-gait tasks, pt requesting BUE support but only utilizing single UE for stability mostly, along with therapist  arm supporting behind his back (defer gait belt 2/2 pt back/rib pain)                            Communication Communication Communication: Impaired Factors Affecting Communication: Difficulty expressing self;Other (comment) (requires increased time for processing due to pain while mobilizing)  Cognition Arousal: Alert Behavior During Therapy: WFL for tasks assessed/performed   PT - Cognitive impairments: Problem solving, Sequencing, Safety/Judgement                       PT - Cognition Comments: Pt A&O to self, location, situation, not further assessed; family present before/after session, left for pt privacy during PT session. Pt slightly distracted while mobilizing due to pain at bx and sternal sites, but motivated to get OOB and with good participation as able. Following commands: Impaired Following commands impaired: Follows one step commands with increased time, Follows multi-step commands with increased time    Cueing Cueing Techniques: Verbal cues, Gestural cues, Visual cues  Exercises Other Exercises Other Exercises: standing BLE AROM: hip flexion x10 reps wtih BUE support Other Exercises: seated BLE AROM: hip flexion x10 reps ea    General Comments General comments (skin integrity, edema, etc.): RN notified condom cath does not appear tightly attached, may need to be switched to primofit or other system. SpO2/HR WFL on RA, RN notified      Pertinent Vitals/Pain Pain Assessment Pain Assessment: 0-10 Pain Score: 5  Pain Location: mid-sternal and L flank/ribcage where bx done and where CT removed earlier in the day. Pain Descriptors / Indicators: Discomfort, Grimacing, Guarding, Sore Pain Intervention(s): Limited activity within patient's tolerance, Monitored during session, Repositioned, Patient requesting pain meds-RN notified, Ice applied    Home Living                          Prior Function            PT Goals (current goals can now  be found in the care plan section) Acute Rehab PT Goals Patient Stated Goal: Less pain and to move better so I can go home. PT Goal Formulation: With patient/family Time For Goal Achievement: 06/12/24 Progress towards PT goals: Progressing toward goals    Frequency    Min 2X/week      PT Plan      Co-evaluation              AM-PAC PT 6 Clicks Mobility   Outcome Measure  Help needed turning from your back to your side while in a flat bed without using bedrails?: A Little Help needed moving from lying on your back to sitting on the side of a flat bed without using bedrails?: A Lot Help needed moving to and from a bed to a chair (including a wheelchair)?: A Lot Help needed standing up from a chair using your arms (e.g., wheelchair or bedside chair)?: A Lot Help needed to walk in hospital room?: Total (pain limiting today) Help needed climbing 3-5 steps with a railing? : Total 6 Click Score: 11    End of Session Equipment Utilized During Treatment: Oxygen (weaned to RA after pivot to chair) Activity Tolerance: Patient tolerated treatment well;Patient  limited by pain;Patient limited by fatigue Patient left: in chair;with call bell/phone within reach;with chair alarm set;Other (comment) (family x3 returning to his room) Nurse Communication: Mobility status;Patient requests pain meds;Other (comment) (weaned to RA; needs different condom cath or primofit) PT Visit Diagnosis: Unsteadiness on feet (R26.81);Muscle weakness (generalized) (M62.81);History of falling (Z91.81);Difficulty in walking, not elsewhere classified (R26.2)     Time: 8291-8268 PT Time Calculation (min) (ACUTE ONLY): 23 min  Charges:    $Therapeutic Exercise: 8-22 mins $Therapeutic Activity: 8-22 mins PT General Charges $$ ACUTE PT VISIT: 1 Visit                     Sydney Hasten P., PTA Acute Rehabilitation Services Secure Chat Preferred 9a-5:30pm Office: [phone]    Connell HERO [facility] 05/31/2024, 5:52  PM

## 2024-05-31 NOTE — Progress Notes (Signed)
 Daily Progress Note Intern Pager: [phone]  Patient name: David [name] Medical record number: [id number] Date of birth: [birth year] Age: 73 y.o. Gender: male  Primary Care Provider: [facility], [address] Consultants: Heme/Onc, CT surgery Code Status: Full code, confirmed with patient Preffered Emergency Contact: Contact Information     Name Relation Home Work Mobile   [place] Daughter [phone]  [phone]      Other Contacts   None on File      Pt Overview and Major Events to Date:  11/4: Admitted to [facility]MTS 11/06: Left VATS biopsy of anterior mediastinal mass  Assessment and Plan: David [name] is a 73 year old male with a PMHx of discitis on oral antibiotics presenting with new onset aphasia and generalized weakness found to have a new mediatinal mass and hypercalcemia.  Assessment & Plan Hypercalcemia Mediastinal mass S/p biopsy of anterior mediastinal mass, possible transfer to [facility] s/p biopsy. Daughter provided with note. Calcium 9.3 - PICC line removal 11/07 once completed ctx/dapto for discitis  - f/u Heme/Onc recs and clarify transfer plans - f/u Phos, Mag 1800  - LR maintenance 125 mL/hr  - PRN zofran  for N&V - AM: BMP, CBC - Fall precautions Discitis of cervical region Per ID, restart antibiotics, will complete 11/7 - Continue IV ctx/dapto (11/6), last dose 1400 - PRN home tramadol for pain control  - Miralax and Sena on for BM  Paroxysmal atrial fibrillation (HCC) Noted on long term monitoring outpatient recently. Scheduled for follow up with cards later this month to discuss management - f/u CTS with anticoagulant plan   - continuous tele   Difficulty urinating - f/u bladder scan - Flomax Severe malnutrition Per RD, ensure high protein, though this was recommended via chart review, have them see the patient and update the recs -RD consult, appreciate recs   FEN/GI: Full diet PPx: Lovenox Dispo:Med-Surg  Subjective:  Found sitting up in  bed with both daughters at bedside, eating jello.  Currently having chest tube removed by nurse  Objective: Temp:  [97.6 F (36.4 C)-98.6 F (37 C)] 98.3 F (36.8 C) (11/07 0258) Pulse Rate:  [60-95] 63 (11/07 0258) Resp:  [11-20] 19 (11/06 2250) BP: (110-159)/(49-106) 110/49 (11/07 0258) SpO2:  [92 %-99 %] 95 % (11/07 0258) Weight:  [73.9 kg] 73.9 kg (11/07 0401)  Physical Exam: General: Chronically ill-appearing, no acute distress, cachectic Cardio: Regular rate, regular rhythm, no murmurs on exam. Pulm: Clear, no wheezing, no crackles. No increased work of breathing, biopsy site to medial left lateral chest covered with gauze no evidence of leakage.  Abdominal: bowel sounds present, soft, non-tender, non-distended Extremities: no peripheral edema  Neuro: alert and oriented x3,   Laboratory: Most recent CBC Lab Results  Component Value Date   WBC 14.1 (H) 05/31/2024   HGB 9.4 (L) 05/31/2024   HCT 28.7 (L) 05/31/2024   MCV 86.4 05/31/2024   PLT 242 05/31/2024   Most recent BMP    Latest Ref Rng & Units 05/31/2024    2:41 AM  BMP  Glucose 70 - 99 mg/dL 885   BUN 8 - 23 mg/dL 17   Creatinine 9.38 - 1.24 mg/dL 9.06   Sodium 864 - 854 mmol/L 138   Potassium 3.5 - 5.1 mmol/L 3.7   Chloride 98 - 111 mmol/L 103   CO2 22 - 32 mmol/L 24   Calcium 8.9 - 10.3 mg/dL 9.3     Other pertinent labs: Mg/Phos WNL    Elodie Palma, MD 05/31/2024,  7:17 AM  PGY-1, [facility] FPTS Intern pager: [phone], text pages welcome Secure chat group [facility] Teaching Service

## 2024-05-31 NOTE — Progress Notes (Signed)
   05/31/24 9078  Spiritual Encounters  Type of Visit Initial  Care provided to: Patient;Family  Conversation partners present during encounter Nurse  Reason for visit Advance directives  OnCall Visit No   This chaplain visited the patient and present family. Educated them about an Proofreader. They have already received the paperwork and will have it completed on Monday. They know to ask the nurse to page a chaplain to assist in completing the document.

## 2024-05-31 NOTE — Assessment & Plan Note (Addendum)
 S/p biopsy of anterior mediastinal mass, possible transfer to [facility] s/p biopsy. Daughter provided with note. Calcium 9.3 - PICC line removal 11/07 once completed ctx/dapto for discitis  - f/u Heme/Onc recs and clarify transfer plans - f/u Phos, Mag 1800  - LR maintenance 125 mL/hr  - PRN zofran  for N&V - AM: BMP, CBC - Fall precautions

## 2024-05-31 NOTE — Assessment & Plan Note (Addendum)
 Per RD, ensure high protein, though this was recommended via chart review, have them see the patient and update the recs -RD consult, appreciate recs

## 2024-05-31 NOTE — Progress Notes (Addendum)
      [address] [place] [employer] [address]             [phone]      1 Day Post-Op Procedure(s) (LRB): VIDEO ASSISTED THORACOSCOPY (VATS)/ LOBECTOMY (N/A) BLOCK, NERVE, INTERCOSTAL (Left) Subjective: Patient reports he has pain when he moves  Objective: Vital signs in last 24 hours: Temp:  [97.6 F (36.4 C)-98.6 F (37 C)] 98.3 F (36.8 C) (11/07 0258) Pulse Rate:  [60-95] 63 (11/07 0258) Cardiac Rhythm: Normal sinus rhythm (11/06 2108) Resp:  [11-20] 19 (11/06 2250) BP: (110-159)/(49-106) 110/49 (11/07 0258) SpO2:  [92 %-99 %] 95 % (11/07 0258) Weight:  [73.9 kg] 73.9 kg (11/07 0401)  Hemodynamic parameters for last 24 hours:    Intake/Output from previous day: 11/06 0701 - 11/07 0700 In: 500 [I.V.:500] Out: 630 [Urine:600; Blood:25; Chest Tube:5] Intake/Output this shift: No intake/output data recorded.  General appearance: alert, cooperative, and no distress Neurologic: intact Heart: regular rate and rhythm, S1, S2 normal, no murmur, click, rub or gallop Lungs: clear to auscultation bilaterally Abdomen: soft, non-tender Wound: Clean and dry dressing in place, scant drainage from chest tube  Lab Results: Recent Labs    05/30/24 0400 05/31/24 0241  WBC 9.1 14.1*  HGB 9.4* 9.4*  HCT 29.4* 28.7*  PLT 228 242   BMET:  Recent Labs    05/30/24 0400 05/31/24 0241  NA 138 138  K 3.1* 3.7  CL 103 103  CO2 24 24  GLUCOSE 101* 114*  BUN 12 17  CREATININE 0.92 0.93  CALCIUM 9.9 9.3    PT/INR:  Recent Labs    05/29/24 1808  LABPROT 16.7*  INR 1.3*   ABG    Component Value Date/Time   TCO2 31 05/28/2024 1432   CBG (last 3)  Recent Labs    05/28/24 1348  GLUCAP 120*    Assessment/Plan: S/P Procedure(s) (LRB): VIDEO ASSISTED THORACOSCOPY (VATS)/ LOBECTOMY (N/A) BLOCK, NERVE, INTERCOSTAL (Left)  Neuro: Pain this AM, only received scheduled Tylenol , 1 dose of Oxycodone  and 1 dose of fentanyl  early this AM. Continue  current pain regimen.  CV: Stable vital signs. NSR and BP controlled. Per Dr. Kerrin hold anticoagulation for full 24 hours. May restart after 6pm tonight.   Pulm: Saturating well on 2L [place]. Drain output 5cc/24hrs, serosanguinous. CXR without obvious pneumothorax, final read pending. No air leak. Can likely d/c drain today. Encourage IS and ambulation. Wean oxygen as tolerated for O2>90%  ID: Likely reactive leukocytosis, WBC 14.1. Afebrile. Will clinically monitor.   Renal: Patient reports he required straight cath due to not being able to void last night, has not voided since. External catheter in place. Will start flomax. Bladder scan this AM, may require another I&O cath.   DVT Prophylaxis: Lovenox  Dispo: Likely d/c drain today. Encourage ambulation. If stable after drain removed we can likely sign off.    LOS: 3 days    Con GORMAN [place], PA-C 05/31/2024

## 2024-05-31 NOTE — Progress Notes (Signed)
 PT Cancellation Note  Patient Details Name: David [name] MRN: [id number] DOB: 13-Feb-1951   Cancelled Treatment:    Reason Eval/Treat Not Completed: (P) Patient at procedure or test/unavailable (pt leaving for Xray) Will continue efforts per PT plan of care as schedule permits.   Saylee Sherrill M Dominion Kathan 05/31/2024, 11:59 AM

## 2024-05-31 NOTE — Assessment & Plan Note (Addendum)
 Per ID, restart antibiotics, will complete 11/7 - Continue IV ctx/dapto (11/6), last dose 1400 - PRN home tramadol for pain control  - Miralax and Sena on for BM

## 2024-05-31 NOTE — Plan of Care (Signed)
Problem: Education: Goal: Knowledge of disease or condition will improve Outcome: Progressing Goal: Knowledge of the prescribed therapeutic regimen will improve Outcome: Progressing   Problem: Clinical Measurements: Goal: Postoperative complications will be avoided or minimized Outcome: Progressing

## 2024-05-31 NOTE — Assessment & Plan Note (Addendum)
 Noted on long term monitoring outpatient recently. Scheduled for follow up with cards later this month to discuss management - f/u CTS with anticoagulant plan   - continuous tele

## 2024-05-31 NOTE — Progress Notes (Signed)
 PHARMACY - ANTICOAGULATION CONSULT NOTE  Pharmacy Consult for enoxaparin Indication: atrial fibrillation  Allergies  Allergen Reactions   Bee Venom Anaphylaxis and Swelling   Honey Bee Venom Anaphylaxis and Swelling    Patient Measurements: Height: 6' (182.9 cm) Weight: 73.9 kg (162 lb 14.7 oz) IBW/kg (Calculated) : 77.6 HEPARIN DW (KG): 73.9  Vital Signs: Temp: 98.4 F (36.9 C) (11/07 0800) Temp Source: Oral (11/07 0800) BP: 112/51 (11/07 0842) Pulse Rate: 72 (11/07 0842)  Labs: Recent Labs    05/28/24 1350 05/28/24 1432 05/28/24 1716 05/28/24 2335 05/29/24 1808 05/29/24 1809 05/30/24 0400 05/31/24 0241  HGB 11.2*   < >  --    < >  --  9.6* 9.4* 9.4*  HCT 34.4*   < >  --    < >  --  30.4* 29.4* 28.7*  PLT 270  --   --    < >  --  239 228 242  APTT 33  --   --   --   --   --   --   --   LABPROT 16.0*  --   --   --  16.7*  --   --   --   INR 1.2  --   --   --  1.3*  --   --   --   HEPARINUNFRC  --   --   --   --  <0.10*  --  0.27*  --   CREATININE 1.00   < >  --    < >  --  1.00 0.92 0.93  TROPONINIHS 5  --  6  --   --   --   --   --    < > = values in this interval not displayed.    Estimated Creatinine Clearance: 72.8 mL/min (by C-G formula based on SCr of 0.93 mg/dL).   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 Pharmacy consulted to start IV heparin.   CBC stable. Hgb down 11 > 9.9, diluational following aggressive IVF for hypercalcemia treatment (s/p 2L NS + LR @125mL /hr). No signs/symptoms bleeding reported. Heparin held for biopsy. Per confirmation with CTS, can resume anticoagulation 24 hours post-biopsy. Will resume this evening.  Goal of Therapy:  Heparin level 0.3-0.7 units/ml Monitor platelets by anticoagulation protocol: Yes   Plan:  Start enoxaparin 70 mg Q12h at  22:00 Check anti-Xa levels as needed while on enoxaparin Resume DOAC closer to discharge once all procedures have been completed Continue to monitor H&H and platelets  Thank you for allowing pharmacy to be a part of this patient's care.  Shelba Collier, PharmD, BCPS Clinical Pharmacist

## 2024-05-31 NOTE — Progress Notes (Signed)
 Patient was bladder scanned showing a total of . A in/out cath was placed and a total of of urine was received. Will continue to monitor.

## 2024-05-31 NOTE — Progress Notes (Signed)
 OT Cancellation Note  Patient Details Name: David [name] MRN: [id number] DOB: 1950/09/17   Cancelled Treatment:    Reason Eval/Treat Not Completed: Patient at procedure or test/ unavailable (X Ray) OT will continue to follow acutely.  David [name] David [name] 05/31/2024, 11:48 AM  David [name] OTR/L Acute Rehabilitation Services Office: [phone]

## 2024-05-31 NOTE — Progress Notes (Signed)
 Initial Nutrition Assessment  DOCUMENTATION CODES:   Severe malnutrition in context of chronic illness (new diagnosis utilizing new NFPE and interview findings)  INTERVENTION:  Liberalize diet to regular diet from heart-healthy diet to encourage PO intake. The patient is at risk for refeeding syndrome. Add Thiamine 100 mg PO daily for 7 days. Add Multivitamin PO daily. Monitor magnesium, potassium, and phosphorus daily for at least 3 days, with replacement per protocol. Ordered labs. Continue Ensure Plus HP BID. Each supplement provides 350 Kcals and 20 grams of protein.   NUTRITION DIAGNOSIS:   Severe Malnutrition related to chronic illness as evidenced by energy intake < or equal to 75% for > or equal to 1 month, severe muscle depletion, moderate fat depletion, percent weight loss.   GOAL:   Patient will meet greater than or equal to 90% of their needs   MONITOR:   PO intake, Supplement acceptance, Weight trends, Labs  REASON FOR ASSESSMENT:   Consult Assessment of nutrition requirement/status  ASSESSMENT:   Patient presented with new onset aphasia and generalized weakness and was found to have new mediastinal mass and hypercalcemia. He is s/p video-assisted thoracoscopy (VATs) and lobectomy 11/6. PMH significant for discitis, Afib, sleep apnea, penile carcinoma, denies DM that is charted in PMH.  The patient had his chest tube removed today. Visited the patient with his daughter Sharyne at bedside. The patient had decreased appetite and was only eating bites of food with 1 Premier Protein daily for about 2 months. He intentionally started eating healthier in August when he was 237 lbs and got down to 195 lbs in September. Any weight loss after that has been unintentional. He tells me his appetite has improved for the first time today and he is about to order his first meal since admission. He had only been drinking the Ensure and eating jello. He states the mirtazapine is new  to him and was just ordered by provider this admission. He denies having diabetes, which is listed in past medical history.  Scheduled Meds:  acetaminophen   1,000 mg Oral Q6H   Or   acetaminophen  (TYLENOL ) oral liquid 160 mg/5 mL  1,000 mg Oral Q6H   Chlorhexidine  Gluconate Cloth  6 each Topical Daily   feeding supplement  237 mL Oral BID BM   ketorolac   15 mg Intravenous Q6H   metoprolol  succinate  50 mg Oral Daily   mirtazapine  15 mg Oral QHS   pantoprazole  40 mg Oral Daily   senna-docusate  1 tablet Oral QHS   tamsulosin  0.4 mg Oral Daily  Magnesium sulfate 2 g IV 11/7 Potassium phosphate 30 mmol IV 11/6  Continuous Infusions:  DAPTOmycin Stopped (05/29/24 1725)    Diet Order             Diet Heart Room service appropriate? Yes; Fluid consistency: Thin  Diet effective now                  Meal Intake: 0%  Labs:     Latest Ref Rng & Units 05/31/2024    2:41 AM 05/30/2024    4:00 AM 05/29/2024    6:09 PM  CMP  Glucose 70 - 99 mg/dL 885  898  883   BUN 8 - 23 mg/dL 17  12  12    Creatinine 0.61 - 1.24 mg/dL 9.06  9.07  8.99   Sodium 135 - 145 mmol/L 138  138  138   Potassium 3.5 - 5.1 mmol/L 3.7  3.1  3.6   Chloride 98 - 111 mmol/L 103  103  101   CO2 22 - 32 mmol/L 24  24  26    Calcium 8.9 - 10.3 mg/dL 9.3  9.9  89.5   Total Protein 6.5 - 8.1 g/dL  6.1  6.4   Total Bilirubin 0.0 - 1.2 mg/dL  0.5  0.5   Alkaline Phos 38 - 126 U/L  119  134   AST 15 - 41 U/L  15  17   ALT 0 - 44 U/L  12  14      I/O: -400 mL since admit   NUTRITION - FOCUSED PHYSICAL EXAM:  Flowsheet Row Most Recent Value  Orbital Region Mild depletion  Upper Arm Region Moderate depletion  Thoracic and Lumbar Region Mild depletion  Buccal Region Severe depletion  Temple Region Severe depletion  Clavicle Bone Region Moderate depletion  Clavicle and Acromion Bone Region Moderate depletion  Scapular Bone Region Moderate depletion  Dorsal Hand No depletion  Patellar Region Moderate  depletion  Anterior Thigh Region Severe depletion  Posterior Calf Region Unable to assess  [bilateral SCDs]  Edema (RD Assessment) Mild  [non-pitting BLE]  Hair Reviewed  Eyes Reviewed  Mouth Other (Comment)  [missing teeth]  Skin Reviewed  Nails Reviewed    EDUCATION NEEDS:   No education needs have been identified at this time  Skin:  Skin Assessment: Skin Integrity Issues: Skin Integrity Issues:: Incisions Incisions: chest/neck  Last BM:  11/2  Height:   Ht Readings from Last 1 Encounters:  05/30/24 6' (1.829 m)    Weight:   UBW 195 lbs/88.6 Kg in 03/2024 after intentional weight loss 15 Kg (17%) unintentional loss in 1 month since  Ideal Body Weight:  81 kg  BMI:  Body mass index is 22.1 kg/m.  Estimated Nutritional Needs:  Kcal:  2100-2400 Protein:  110-130 Fluid:  >2 L    Leverne Ruth, MS, RDN, LDN [place] See AMION for contact information

## 2024-06-01 ENCOUNTER — Inpatient Hospital Stay (HOSPITAL_COMMUNITY)

## 2024-06-01 DIAGNOSIS — J95811 Postprocedural pneumothorax: Secondary | ICD-10-CM

## 2024-06-01 DIAGNOSIS — R339 Retention of urine, unspecified: Secondary | ICD-10-CM | POA: Diagnosis not present

## 2024-06-01 DIAGNOSIS — E43 Unspecified severe protein-calorie malnutrition: Secondary | ICD-10-CM | POA: Insufficient documentation

## 2024-06-01 DIAGNOSIS — J9859 Other diseases of mediastinum, not elsewhere classified: Secondary | ICD-10-CM | POA: Diagnosis not present

## 2024-06-01 DIAGNOSIS — Z789 Other specified health status: Secondary | ICD-10-CM

## 2024-06-01 LAB — BASIC METABOLIC PANEL WITH GFR
Anion gap: 9 (ref 5–15)
BUN: 17 mg/dL (ref 8–23)
CO2: 24 mmol/L (ref 22–32)
Calcium: 8.4 mg/dL — ABNORMAL LOW (ref 8.9–10.3)
Chloride: 102 mmol/L (ref 98–111)
Creatinine, Ser: 0.83 mg/dL (ref 0.61–1.24)
GFR, Estimated: >60 mL/min (ref >60)
Glucose, Bld: 128 mg/dL — ABNORMAL HIGH (ref 70–99)
Potassium: 4 mmol/L (ref 3.5–5.1)
Sodium: 135 mmol/L (ref 135–145)

## 2024-06-01 LAB — COMPREHENSIVE METABOLIC PANEL WITH GFR
ALT: 10 U/L (ref 0–44)
AST: 14 U/L — ABNORMAL LOW (ref 15–41)
Albumin: 2.5 g/dL — ABNORMAL LOW (ref 3.5–5.0)
Alkaline Phosphatase: 110 U/L (ref 38–126)
Anion gap: 14 (ref 5–15)
BUN: 17 mg/dL (ref 8–23)
CO2: 22 mmol/L (ref 22–32)
Calcium: 8.7 mg/dL — ABNORMAL LOW (ref 8.9–10.3)
Chloride: 102 mmol/L (ref 98–111)
Creatinine, Ser: 0.87 mg/dL (ref 0.61–1.24)
GFR, Estimated: >60 mL/min (ref >60)
Glucose, Bld: 91 mg/dL (ref 70–99)
Potassium: 3.3 mmol/L — ABNORMAL LOW (ref 3.5–5.1)
Sodium: 138 mmol/L (ref 135–145)
Total Bilirubin: 0.6 mg/dL (ref 0.0–1.2)
Total Protein: 6 g/dL — ABNORMAL LOW (ref 6.5–8.1)

## 2024-06-01 LAB — URINALYSIS, COMPLETE (UACMP) WITH MICROSCOPIC
Bilirubin Urine: NEGATIVE
Glucose, UA: NEGATIVE mg/dL
Ketones, ur: NEGATIVE mg/dL
Nitrite: NEGATIVE
Protein, ur: NEGATIVE mg/dL
Specific Gravity, Urine: 1.019 (ref 1.005–1.030)
pH: 5 (ref 5.0–8.0)

## 2024-06-01 LAB — CBC WITH DIFFERENTIAL/PLATELET
Abs Immature Granulocytes: 0.05 K/uL (ref 0.00–0.07)
Basophils Absolute: 0 K/uL (ref 0.0–0.1)
Basophils Relative: 0 %
Eosinophils Absolute: 0.1 K/uL (ref 0.0–0.5)
Eosinophils Relative: 1 %
HCT: 27.3 % — ABNORMAL LOW (ref 39.0–52.0)
Hemoglobin: 9 g/dL — ABNORMAL LOW (ref 13.0–17.0)
Immature Granulocytes: 1 %
Lymphocytes Relative: 18 %
Lymphs Abs: 1.8 K/uL (ref 0.7–4.0)
MCH: 28 pg (ref 26.0–34.0)
MCHC: 33 g/dL (ref 30.0–36.0)
MCV: 85 fL (ref 80.0–100.0)
Monocytes Absolute: 0.9 K/uL (ref 0.1–1.0)
Monocytes Relative: 9 %
Neutro Abs: 7.5 K/uL (ref 1.7–7.7)
Neutrophils Relative %: 71 %
Platelets: 210 K/uL (ref 150–400)
RBC: 3.21 MIL/uL — ABNORMAL LOW (ref 4.22–5.81)
RDW: 16.3 % — ABNORMAL HIGH (ref 11.5–15.5)
WBC: 10.4 K/uL (ref 4.0–10.5)
nRBC: 0 % (ref 0.0–0.2)

## 2024-06-01 LAB — MAGNESIUM
Magnesium: 1.7 mg/dL (ref 1.7–2.4)
Magnesium: 1.9 mg/dL (ref 1.7–2.4)

## 2024-06-01 LAB — PHOSPHORUS
Phosphorus: 1.9 mg/dL — ABNORMAL LOW (ref 2.5–4.6)
Phosphorus: 2.1 mg/dL — ABNORMAL LOW (ref 2.5–4.6)

## 2024-06-01 MED ORDER — BARRIER CREAM NON-SPECIFIED
1.0000 | TOPICAL_CREAM | Freq: Every day | TOPICAL | Status: DC
  Administered 2024-06-01 – 2024-06-06 (×6): 1 via TOPICAL
  Filled 2024-06-01: qty 1

## 2024-06-01 MED ORDER — POTASSIUM PHOSPHATES 15 MMOLE/5ML IV SOLN
15.0000 mmol | Freq: Once | INTRAVENOUS | Status: AC
  Administered 2024-06-01: 15 mmol via INTRAVENOUS
  Filled 2024-06-01: qty 5

## 2024-06-01 MED ORDER — POLYETHYLENE GLYCOL 3350 17 G PO PACK
17.0000 g | PACK | Freq: Every day | ORAL | Status: DC
  Administered 2024-06-01 – 2024-06-03 (×3): 17 g via ORAL
  Filled 2024-06-01 (×3): qty 1

## 2024-06-01 MED ORDER — SODIUM PHOSPHATES 45 MMOLE/15ML IV SOLN
15.0000 mmol | Freq: Once | INTRAVENOUS | Status: AC
  Administered 2024-06-01: 15 mmol via INTRAVENOUS
  Filled 2024-06-01: qty 5

## 2024-06-01 MED ORDER — MAGNESIUM SULFATE 2 GM/50ML IV SOLN
2.0000 g | Freq: Once | INTRAVENOUS | Status: AC
  Administered 2024-06-01: 2 g via INTRAVENOUS
  Filled 2024-06-01: qty 50

## 2024-06-01 MED ORDER — APIXABAN 5 MG PO TABS
5.0000 mg | ORAL_TABLET | Freq: Two times a day (BID) | ORAL | Status: DC
  Administered 2024-06-01 – 2024-06-06 (×11): 5 mg via ORAL
  Filled 2024-06-01 (×11): qty 1

## 2024-06-01 MED ORDER — POTASSIUM CHLORIDE CRYS ER 20 MEQ PO TBCR
40.0000 meq | EXTENDED_RELEASE_TABLET | Freq: Once | ORAL | Status: AC
  Administered 2024-06-01: 40 meq via ORAL
  Filled 2024-06-01: qty 2

## 2024-06-01 NOTE — Assessment & Plan Note (Addendum)
 Ca of 8.7 today, stable from yesterday at 8.9. - Surgical biopsy results pending - Heme/Onc following, appreciate recommendations - Pain regimen: Tylenol  1000 mg q6h, tizanidine 4 mg q8h prn; CTS d/c oxycodone  and fentanyl  today 11/8 - Zofran  PRN for N/V - Fluids: maintenance LR at 125 mL/hr - Labs: Mag, Phos, CMP, CBC - Bowel regimen: Miralax prn, Senna daily -> scheduling Miralax daily - Fall precautions - Delirium precautions ordered today - PT/OT eval and treat

## 2024-06-01 NOTE — Assessment & Plan Note (Addendum)
 Failed bladder scan x3 11/7 and Foley was placed. This morning, family concerned about possible UTI given increased confusion, though when I saw him patient alert and appropriately responding to questions and back at baseline per family; some suprapubic tenderness on exam which could be related to retention and Foley, otherwise no s/s of UTI.  - Consider UA if patient's symptoms worsening - Flomax 0.4 mg daily - Plan to keep Foley 5 days; will plan to call [facility] office to arrange follow-up for removal/eval if pt discharging home

## 2024-06-01 NOTE — Assessment & Plan Note (Addendum)
 Noted outpatient; fu with cards later this month for management. - Pt on Tele  - Toprol  50 mg daily for rhythm and rate control  - Restarting Eliquis 5 mg BID today; okay per CTS note 11/7

## 2024-06-01 NOTE — Progress Notes (Signed)
 Occupational Therapy Treatment Patient Details Name: David [name] MRN: [id number] DOB: Apr 07, 1951 Today's Date: 06/01/2024   History of present illness 73 y.o. male presenting to ED 10/21 with generalized weakness and worsening aphasia for the last 72 hours. 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 Calcium was found to be 13.1. 11/6 s/p VATS with bx of L mediastinal mass. 11/7 L CT removed in AM. PMH: s/p C5-C6-C7 decompressive laminectomy with evacuation of cervical epidural abscess 9/26, HTN, GERD, colon polyp, and skin cancer.   OT comments  Patient demonstrating good gains with OT treatment. Patient continues to require mod assist for bed mobility but increased sit to stands and transfers to min assist with one person HHA. Patient able to stand at sink for grooming tasks but required seated rest break to complete. Patient asked to return to bed at end of session.  Discharge recommendations continue to be appropriate for David [name] with family to assist.  Acute OT to continue to follow.       If plan is discharge home, recommend the following:  A lot of help with bathing/dressing/bathroom;Assistance with cooking/housework;Direct supervision/assist for medications management;Direct supervision/assist for financial management;Assist for transportation;Help with stairs or ramp for entrance;A little help with walking and/or transfers   Equipment Recommendations  BSC/3in1 (other TBD based on pt's progress)    Recommendations for Other Services      Precautions / Restrictions Precautions Precautions: Fall Recall of Precautions/Restrictions: Intact Precaution/Restrictions Comments: recent fall day PTA Restrictions Weight Bearing Restrictions Per Provider Order: No       Mobility Bed Mobility Overal bed mobility: Needs Assistance Bed Mobility: Supine to Sit, Sit to Supine     Supine to sit: Mod assist, HOB elevated, Used rails Sit to supine: Mod  assist   General bed mobility comments: required assistance with trunk an scooting to EOB and assistance with BLEs to return to supine    Transfers Overall transfer level: Needs assistance Equipment used: 1 person hand held assist Transfers: Sit to/from Stand, Bed to chair/wheelchair/BSC Sit to Stand: Min assist           General transfer comment: HHA to stand to EOB and to ambulate short distance to sink.     Balance Overall balance assessment: Needs assistance Sitting-balance support: Bilateral upper extremity supported, Feet supported Sitting balance-Leahy Scale: Fair Sitting balance - Comments: EOB   Standing balance support: Single extremity supported, During functional activity Standing balance-Leahy Scale: Poor Standing balance comment: CGA to min assist for balance                           ADL either performed or assessed with clinical judgement   ADL Overall ADL's : Needs assistance/impaired Eating/Feeding: Set up   Grooming: Wash/dry hands;Wash/dry face;Oral care;Brushing hair;Contact guard assist;Standing;Sitting Grooming Details (indicate cue type and reason): able to perform grooming tasks standing at sink but completed grooming tasks seated due to fatigue Upper Body Bathing: Contact guard assist;Standing       Upper Body Dressing : Contact guard assist;Sitting                     General ADL Comments: decreased assistance needed with self care tasks with increased standing tolerance with ADLs    Extremity/Trunk Assessment              Vision       Perception     Praxis  Communication Communication Communication: No apparent difficulties   Cognition Arousal: Alert Behavior During Therapy: WFL for tasks assessed/performed Cognition: Cognition impaired   Orientation impairments: Time     Attention impairment (select first level of impairment): Selective attention   OT - Cognition Comments: alert and oriented with  improved cognition and STM                 Following commands: Impaired Following commands impaired: Follows one step commands with increased time, Follows multi-step commands with increased time      Cueing   Cueing Techniques: Verbal cues, Gestural cues, Visual cues  Exercises      Shoulder Instructions       General Comments      Pertinent Vitals/ Pain       Pain Assessment Pain Assessment: No/denies pain Pain Intervention(s): Monitored during session  Home Living                                          Prior Functioning/Environment              Frequency  Min 2X/week        Progress Toward Goals  OT Goals(current goals can now be found in the care plan section)  Progress towards OT goals: Progressing toward goals  Acute Rehab OT Goals Patient Stated Goal: to feel better OT Goal Formulation: With patient/family Time For Goal Achievement: 06/12/24 Potential to Achieve Goals: Good ADL Goals Pt Will Perform Grooming: with contact guard assist;standing Pt Will Perform Upper Body Bathing: with supervision;sitting Pt Will Perform Lower Body Bathing: with contact guard assist;sitting/lateral leans;sit to/from stand Pt Will Perform Lower Body Dressing: with contact guard assist;sitting/lateral leans;sit to/from stand Pt Will Transfer to Toilet: with min assist;ambulating;bedside commode (with least restrictive AD) Pt Will Perform Toileting - Clothing Manipulation and hygiene: with contact guard assist;sit to/from stand  Plan      Co-evaluation                 AM-PAC OT 6 Clicks Daily Activity     Outcome Measure   Help from another person eating meals?: A Little Help from another person taking care of personal grooming?: A Little Help from another person toileting, which includes using toliet, bedpan, or urinal?: A Lot Help from another person bathing (including washing, rinsing, drying)?: A Lot Help from another person  to put on and taking off regular upper body clothing?: A Little Help from another person to put on and taking off regular lower body clothing?: A Lot 6 Click Score: 15    End of Session Equipment Utilized During Treatment: Gait belt  OT Visit Diagnosis: Muscle weakness (generalized) (M62.81);Other symptoms and signs involving cognitive function   Activity Tolerance Patient tolerated treatment well;Patient limited by fatigue   Patient Left in bed;with call bell/phone within reach;with family/visitor present   Nurse Communication Mobility status        Time: 8985-8948 OT Time Calculation (min): 37 min  Charges: OT General Charges $OT Visit: 1 Visit OT Treatments $Self Care/Home Management : 23-37 mins  Dick Laine, OTA Acute Rehabilitation Services  Office [phone]   Jeb LITTIE Laine 06/01/2024, 1:57 PM

## 2024-06-01 NOTE — Assessment & Plan Note (Addendum)
 Reflux: Protonix 40 mg daily

## 2024-06-01 NOTE — Assessment & Plan Note (Addendum)
 Reports appetite improved on Remeron - RD following, appreciate recommendations - Ensure BID between meals - Multivitamin daily - Thiamine 100 mg daily - Remeron 15 mg daily at bedtime - Monitoring labs for refeeding

## 2024-06-01 NOTE — Progress Notes (Addendum)
      [address] [employer] [address]             [phone]      2 Days Post-Op Procedure(s) (LRB): VIDEO ASSISTED THORACOSCOPY (VATS)/ LOBECTOMY (N/A) BLOCK, NERVE, INTERCOSTAL (Left) Subjective: Patient with no new complaints but family bedside reports he has been confused  Objective: Vital signs in last 24 hours: Temp:  [98.3 F (36.8 C)-98.8 F (37.1 C)] 98.7 F (37.1 C) (11/08 0700) Pulse Rate:  [70-85] 85 (11/08 0700) Cardiac Rhythm: Normal sinus rhythm (11/08 0740) Resp:  [16-20] 18 (11/08 0700) BP: (126-151)/(43-60) 151/43 (11/08 0700) SpO2:  [92 %-96 %] 93 % (11/08 0700) Weight:  [82 kg] 82 kg (11/08 0317)  Hemodynamic parameters for last 24 hours:    Intake/Output from previous day: 11/07 0701 - 11/08 0700 In: -  Out: 800 [Urine:800] Intake/Output this shift: Total I/O In: -  Out: 1000 [Urine:1000]  General appearance: alert, cooperative, and no distress Neurologic: intact and A&Ox4 Heart: regular rate and rhythm, S1, S2 normal, no murmur, click, rub or gallop Lungs: clear to auscultation bilaterally Abdomen: soft, non-tender; bowel sounds normal; no masses,  no organomegaly Extremities: extremities normal, atraumatic, no cyanosis or edema Wound: Clean and dry without sign of infection  Lab Results: Recent Labs    05/31/24 0241 06/01/24 0314  WBC 14.1* 10.4  HGB 9.4* 9.0*  HCT 28.7* 27.3*  PLT 242 210   BMET:  Recent Labs    05/31/24 2007 06/01/24 0314  NA 134* 138  K 3.7 3.3*  CL 100 102  CO2 21* 22  GLUCOSE 106* 91  BUN 21 17  CREATININE 0.98 0.87  CALCIUM 8.9 8.7*    PT/INR:  Recent Labs    05/29/24 1808  LABPROT 16.7*  INR 1.3*   ABG    Component Value Date/Time   TCO2 31 05/28/2024 1432   CBG (last 3)  No results for input(s): GLUCAP in the last 72 hours.  Assessment/Plan: S/P Procedure(s) (LRB): VIDEO ASSISTED THORACOSCOPY (VATS)/ LOBECTOMY (N/A) BLOCK, NERVE, INTERCOSTAL  (Left)  Neuro: Pain controlled this AM. Family reports some confusion this AM but patient is A&Ox4 on my arrival. May be due to pain medication will d/c fentanyl  and oxycodone .    CV: Stable vital signs. NSR. Hx of afib, ok to start anticoagulation if needed from CT surgery perspective.    Pulm: Saturating well on RA. CT removed yesterday. CXR with 5% stable left apical pneumothorax. Encourage IS and ambulation.   ID: Likely reactive leukocytosis resolved.  Renal: Patient required foley placement and family reports some mild confusion this AM. On flomax, will get UA. Management per primary team.   ` DVT Prophylaxis: Lovenox   Dispo: CXR shows stable small left apical pneumothorax, likely developed when CT was removed. Follow up appointment has been made and DC instructions have been placed in AVS. We will follow for pathology results, currently pending. We will sign off. Please call for any further questions.    LOS: 4 days    Con GORMAN Bend, PA-C 06/01/2024 Patient seen and examined, agree with above Path pending  Elspeth BROCKS. Kerrin, MD [facility] [phone]

## 2024-06-01 NOTE — Progress Notes (Signed)
 Daily Progress Note Intern Pager: [phone]  Patient name: David [name] Medical record number: [id number] Date of birth: 01/27/1951 Age: 73 y.o. Gender: male  Primary Care Provider: Health, [address] Consultants: Heme/Onc, CT surgery Code Status: Full  Pt Overview and Major Events to Date:  11/4: Admitted to FMTS 11/6: Left VATS biopsy of anterior mediastinal mass  Medical Decision Making:  Kyjuan Gause is a 73 y.o. 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 Admitted for weakness and aphasia and found to have new mediastinal mass with hypercalcemia. Underwent biopsy of mass, pathology pending. Calcium continues to improve with fluids.   Oncology messaged re: whether patient will transfer to [facility] soon, or if he can go home to await biopsy results; per Dr. Norleen Kidney, pt can follow-up with oncology outpatient and does not have a need for inpatient treatment at [facility].  Daughter mentioned bedsores to my attending; this was not mentioned during my visit and so I did not assess this, but have asked nurse to check. Assessment & Plan Hypercalcemia Mediastinal mass Ca of 8.7 today, stable from yesterday at 8.9. - Surgical biopsy results pending - Heme/Onc following, appreciate recommendations - Pain regimen: Tylenol  1000 mg q6h, tizanidine 4 mg q8h prn; CTS d/c oxycodone  and fentanyl  today 11/8 - Zofran  PRN for N/V - Fluids: maintenance LR at 125 mL/hr - Labs: Mag, Phos, CMP, CBC - Bowel regimen: Miralax prn, Senna daily -> scheduling Miralax daily - Fall precautions - Delirium precautions ordered today - PT/OT eval and treat Pneumothorax after biopsy Xray done 11/7 with 5% left apical pneumothorax. - Repeat CXR today to follow; read pending Discitis Patient continued on abx per ID during admission. Missed dose of dapto earlier in admission, planning for last dose today 11/8 via PIV (PICC removed yesterday 11/7). - Abx: S/p IV CTX (completed 11/7); to  complete IV Daptomycin today 11/8 Paroxysmal atrial fibrillation (HCC) Noted outpatient; fu with cards later this month for management. - Pt on Tele  - Toprol  50 mg daily for rhythm and rate control  - Restarting Eliquis 5 mg BID today; okay per CTS note 11/7 Difficulty urinating Urinary retention Failed bladder scan x3 11/7 and Foley was placed. This morning, family concerned about possible UTI given increased confusion, though when I saw him patient alert and appropriately responding to questions and back at baseline per family; some suprapubic tenderness on exam which could be related to retention and Foley, otherwise no s/s of UTI.  - Consider UA if patient's symptoms worsening - Flomax 0.4 mg daily - Plan to keep Foley 5 days; will plan to call [facility] office to arrange follow-up for removal/eval if pt discharging home Severe malnutrition Reports appetite improved on Remeron - RD following, appreciate recommendations - Ensure BID between meals - Multivitamin daily - Thiamine 100 mg daily - Remeron 15 mg daily at bedtime - Monitoring labs for refeeding Hypokalemia  Hypomagnesemia  Hypophosphatemia K of 3.3 this am, repleted with 40 mEq K Cl PO. Mag of 1.7 this am, repleted with 2g Mag IV. Phos of 1.9 this am, repleted with 15 mmol K Phos IV. - Will recheck BMP, Mag and Phos at 1800 Chronic health problem Reflux: Protonix 40 mg daily  FEN/GI: Regular PPx: Eliquis Dispo: Potentially home with [place] tomorrow pending able to return home. Barriers include restarting Eliquis today, clarifying treatment plan with oncology.  Subjective:  Patient seen along with one of his daughters, who is at bedside.  Patient reports he is doing well overall today.  He is eating better with Remeron.  Did report some burning at his IV infusion site earlier today, though this was resolved by the end of our visit and suspect related to getting IV electrolytes repleted.  Otherwise no concerns from  patient.  Patient's daughter does report that patient was a little more confused than usual this morning, though he is back at his baseline per her report.  At time of my visit.  She was wondering about need for UA to check for UTI.  Patient does not report any burning or pain with peeing, increased need to pee, abdominal pain, nausea/vomiting, back pain.  Objective: Temp:  [97.8 F (36.6 C)-98.8 F (37.1 C)] 97.8 F (36.6 C) (11/08 1142) Pulse Rate:  [70-85] 78 (11/08 1142) Resp:  [16-20] 20 (11/08 1142) BP: (119-151)/(43-60) 119/44 (11/08 1142) SpO2:  [92 %-96 %] 93 % (11/08 0700) Weight:  [82 kg] 82 kg (11/08 0317) Physical Exam: General: Patient lying in bed with head of bed elevated, no acute distress. Cardiovascular: Regular rate and rhythm, no murmurs/rubs/gallops. Respiratory: Normal work of breathing on room air. Clear to auscultation bilaterally; no wheezes, crackles. Abdomen: Bowel sounds present and normoactive bilaterally. Soft, nondistended.  Suprapubic tenderness to palpation, no rebound, no guarding. Extremities: Skin warm, dry. No bilateral lower extremity edema. Derm: Surgical site of left flank below armpit with scar that is somewhat erythematous, no edema, no warmth, no drainage.  Port site covered with bandage, no drainage seen through bandage.  Laboratory: Most recent CBC Lab Results  Component Value Date   WBC 10.4 06/01/2024   HGB 9.0 (L) 06/01/2024   HCT 27.3 (L) 06/01/2024   MCV 85.0 06/01/2024   PLT 210 06/01/2024   Most recent BMP    Latest Ref Rng & Units 06/01/2024    3:14 AM  BMP  Glucose 70 - 99 mg/dL 91   BUN 8 - 23 mg/dL 17   Creatinine 9.38 - 1.24 mg/dL 9.12   Sodium 864 - 854 mmol/L 138   Potassium 3.5 - 5.1 mmol/L 3.3   Chloride 98 - 111 mmol/L 102   CO2 22 - 32 mmol/L 22   Calcium 8.9 - 10.3 mg/dL 8.7   Phos 1.9, Mag 1.7   Larraine Palma, MD 06/01/2024, 1:23 PM  PGY-1, [facility] FPTS Intern pager: [phone],  text pages welcome Secure chat group [facility] Teaching Service

## 2024-06-01 NOTE — Assessment & Plan Note (Signed)
 Xray done 11/7 with 5% left apical pneumothorax. - Repeat CXR today to follow; read pending

## 2024-06-01 NOTE — Assessment & Plan Note (Addendum)
 K of 3.3 this am, repleted with 40 mEq K Cl PO. Mag of 1.7 this am, repleted with 2g Mag IV. Phos of 1.9 this am, repleted with 15 mmol K Phos IV. - Will recheck BMP, Mag and Phos at 1800

## 2024-06-01 NOTE — Plan of Care (Signed)
  Problem: Education: Goal: Knowledge of disease or condition will improve Outcome: Progressing   Problem: Activity: Goal: Risk for activity intolerance will decrease Outcome: Progressing   Problem: Pain Management: Goal: Pain level will decrease Outcome: Progressing

## 2024-06-01 NOTE — Progress Notes (Signed)
 05/31/24 2100 Foley was inserted per MD order. CNA assist. It appeared to leak, so 11cc used to fill foley balloon. Draining well, but intermittently still leaking from urethra.

## 2024-06-01 NOTE — Assessment & Plan Note (Addendum)
 Patient continued on abx per ID during admission. Missed dose of dapto earlier in admission, planning for last dose today 11/8 via PIV (PICC removed yesterday 11/7). - Abx: S/p IV CTX (completed 11/7); to complete IV Daptomycin today 11/8

## 2024-06-02 DIAGNOSIS — R339 Retention of urine, unspecified: Secondary | ICD-10-CM | POA: Diagnosis not present

## 2024-06-02 DIAGNOSIS — J9859 Other diseases of mediastinum, not elsewhere classified: Secondary | ICD-10-CM | POA: Diagnosis not present

## 2024-06-02 LAB — CBC
HCT: 26.4 % — ABNORMAL LOW (ref 39.0–52.0)
Hemoglobin: 8.6 g/dL — ABNORMAL LOW (ref 13.0–17.0)
MCH: 27.7 pg (ref 26.0–34.0)
MCHC: 32.6 g/dL (ref 30.0–36.0)
MCV: 84.9 fL (ref 80.0–100.0)
Platelets: 219 K/uL (ref 150–400)
RBC: 3.11 MIL/uL — ABNORMAL LOW (ref 4.22–5.81)
RDW: 16.4 % — ABNORMAL HIGH (ref 11.5–15.5)
WBC: 8.8 K/uL (ref 4.0–10.5)
nRBC: 0 % (ref 0.0–0.2)

## 2024-06-02 LAB — COMPREHENSIVE METABOLIC PANEL WITH GFR
ALT: 10 U/L (ref 0–44)
AST: 13 U/L — ABNORMAL LOW (ref 15–41)
Albumin: 2.4 g/dL — ABNORMAL LOW (ref 3.5–5.0)
Alkaline Phosphatase: 110 U/L (ref 38–126)
Anion gap: 13 (ref 5–15)
BUN: 13 mg/dL (ref 8–23)
CO2: 23 mmol/L (ref 22–32)
Calcium: 8.4 mg/dL — ABNORMAL LOW (ref 8.9–10.3)
Chloride: 102 mmol/L (ref 98–111)
Creatinine, Ser: 0.85 mg/dL (ref 0.61–1.24)
GFR, Estimated: >60 mL/min (ref >60)
Glucose, Bld: 92 mg/dL (ref 70–99)
Potassium: 3.7 mmol/L (ref 3.5–5.1)
Sodium: 138 mmol/L (ref 135–145)
Total Bilirubin: 0.5 mg/dL (ref 0.0–1.2)
Total Protein: 5.7 g/dL — ABNORMAL LOW (ref 6.5–8.1)

## 2024-06-02 LAB — TYPE AND SCREEN
ABO/RH(D): B POS
Antibody Screen: NEGATIVE
Unit division: 0
Unit division: 0

## 2024-06-02 LAB — BPAM RBC
Blood Product Expiration Date: 202511262359
Blood Product Expiration Date: 202511292359
Unit Type and Rh: 7300
Unit Type and Rh: 7300

## 2024-06-02 LAB — PHOSPHORUS: Phosphorus: 2.6 mg/dL (ref 2.5–4.6)

## 2024-06-02 LAB — MAGNESIUM: Magnesium: 1.7 mg/dL (ref 1.7–2.4)

## 2024-06-02 MED ORDER — MAGNESIUM SULFATE 2 GM/50ML IV SOLN
2.0000 g | Freq: Once | INTRAVENOUS | Status: AC
  Administered 2024-06-02: 2 g via INTRAVENOUS
  Filled 2024-06-02: qty 50

## 2024-06-02 MED ORDER — POTASSIUM CHLORIDE 20 MEQ PO PACK
40.0000 meq | PACK | Freq: Once | ORAL | Status: AC
  Administered 2024-06-02: 40 meq via ORAL
  Filled 2024-06-02: qty 2

## 2024-06-02 MED ORDER — ACETAMINOPHEN 500 MG PO TABS
1000.0000 mg | ORAL_TABLET | Freq: Four times a day (QID) | ORAL | Status: DC | PRN
  Administered 2024-06-02: 1000 mg via ORAL
  Filled 2024-06-02 (×2): qty 2

## 2024-06-02 MED ORDER — METOPROLOL TARTRATE 5 MG/5ML IV SOLN
5.0000 mg | Freq: Once | INTRAVENOUS | Status: AC
  Administered 2024-06-02: 5 mg via INTRAVENOUS
  Filled 2024-06-02: qty 5

## 2024-06-02 NOTE — Progress Notes (Signed)
 Physical Therapy Treatment Patient Details Name: David [name] MRN: [id number] DOB: 03-16-1951 Today's Date: 06/02/2024   History of Present Illness 73 y.o. male presenting to ED 10/21 with generalized weakness and worsening aphasia for the last 72 hours. 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 Calcium was found to be 13.1. 11/6 s/p VATS with bx of L mediastinal mass. 11/7 L CT removed in AM. PMH: s/p C5-C6-C7 decompressive laminectomy with evacuation of cervical epidural abscess 9/26, HTN, GERD, colon polyp, and skin cancer.    PT Comments  Patient eager to get better. Transfers with CGA with cues for safest use of RW. Ambulated 180 ft with RW and CGA with sats 94% on RA and HRmax 116. Had +2 follow with chair in case pt got too fatigued, however he was able to walk back to his room without having to sit/ride. States he feels ready to discharge home from a mobility perspective.   If plan is discharge home, recommend the following: A little help with walking and/or transfers;A little help with bathing/dressing/bathroom;Assistance with cooking/housework;Direct supervision/assist for medications management;Direct supervision/assist for financial management;Assist for transportation;Help with stairs or ramp for entrance;Supervision due to cognitive status   Can travel by private vehicle        Equipment Recommendations  None recommended by PT    Recommendations for Other Services       Precautions / Restrictions Precautions Precautions: Fall Recall of Precautions/Restrictions: Intact Precaution/Restrictions Comments: recent fall day PTA Restrictions Weight Bearing Restrictions Per Provider Order: No     Mobility  Bed Mobility               General bed mobility comments: up on BSC on arrival    Transfers Overall transfer level: Needs assistance Equipment used: Rolling walker (2 wheels) Transfers: Sit to/from Stand Sit to Stand:  Contact guard assist           General transfer comment: vc for hand placement; no physical assist    Ambulation/Gait Ambulation/Gait assistance: +2 safety/equipment, Contact guard assist (chair follow (ultimately not needed)) Gait Distance (Feet): 180 Feet Assistive device: Rolling walker (2 wheels) Gait Pattern/deviations: Step-through pattern, Decreased stride length   Gait velocity interpretation: <1.8 ft/sec, indicate of risk for recurrent falls   General Gait Details: pt eager to ambulate as he hopes to go home today; pt self-correcting flexed posture   Stairs             Wheelchair Mobility     Tilt Bed    Modified Rankin (Stroke Patients Only)       Balance Overall balance assessment: Needs assistance Sitting-balance support: Feet supported, No upper extremity supported Sitting balance-Leahy Scale: Fair     Standing balance support: During functional activity, Bilateral upper extremity supported Standing balance-Leahy Scale: Poor Standing balance comment: using RW                            Communication Communication Communication: No apparent difficulties  Cognition Arousal: Alert Behavior During Therapy: WFL for tasks assessed/performed                             Following commands: Impaired Following commands impaired: Follows one step commands with increased time, Follows multi-step commands with increased time    Cueing Cueing Techniques: Verbal cues, Gestural cues, Visual cues  Exercises      General Comments General  comments (skin integrity, edema, etc.): HR 112-116; sats 94% on RA      Pertinent Vitals/Pain Pain Assessment Pain Assessment: Faces Faces Pain Scale: No hurt    Home Living                          Prior Function            PT Goals (current goals can now be found in the care plan section) Acute Rehab PT Goals Patient Stated Goal: Less pain and to move better so I can go  home. Time For Goal Achievement: 06/12/24 Potential to Achieve Goals: Fair Progress towards PT goals: Progressing toward goals    Frequency    Min 2X/week      PT Plan      Co-evaluation              AM-PAC PT 6 Clicks Mobility   Outcome Measure  Help needed turning from your back to your side while in a flat bed without using bedrails?: A Little Help needed moving from lying on your back to sitting on the side of a flat bed without using bedrails?: A Lot Help needed moving to and from a bed to a chair (including a wheelchair)?: A Little Help needed standing up from a chair using your arms (e.g., wheelchair or bedside chair)?: A Little Help needed to walk in hospital room?: A Little Help needed climbing 3-5 steps with a railing? : A Little 6 Click Score: 17    End of Session Equipment Utilized During Treatment: Gait belt Activity Tolerance: Patient tolerated treatment well Patient left: in chair;with call bell/phone within reach;with chair alarm set Nurse Communication: Mobility status PT Visit Diagnosis: Unsteadiness on feet (R26.81);Muscle weakness (generalized) (M62.81);History of falling (Z91.81);Difficulty in walking, not elsewhere classified (R26.2)     Time: 8991-8978 PT Time Calculation (min) (ACUTE ONLY): 13 min  Charges:    $Gait Training: 8-22 mins PT General Charges $$ ACUTE PT VISIT: 1 Visit                      Macario RAMAN, PT Acute Rehabilitation Services  Office [phone]    Macario SHAUNNA Soja 06/02/2024, 10:32 AM

## 2024-06-02 NOTE — Progress Notes (Signed)
 Daily Progress Note Intern Pager: [phone]  Patient name: Tellis Spivak Medical record number: [id number] Date of birth: 10/30/1950 Age: 73 y.o. Gender: male  Primary Care Provider: [facility], [address] Consultants: Heme/Onc, CT surgery Code Status: Full, confirmed with patient  Pt Overview and Major Events to Date:  11/4: Admitted to FMTS 11/6: Left VATS biopsy of anterior mediastinal mass  Assessment and Plan: Tamar Lipscomb is a 73 y.o. male with a PMH of discitis (on abx), history of penile cancer (2023). Admitted for weakness and aphasia and found to have a new mediastinal mass with hypercalcemia. Underwent biopsy of mass, results pending. Calcium continues to improve with fluids. Patient has been on scheduled tylenol , will do PRN and monitor for any fever.  Assessment & Plan Hypercalcemia Mediastinal mass Ca of 8.4 today, stable from yesterday at 8.4, corrected Ca 9.3. Per Heme, pt can follow-up with oncology op and does not need transfer to [facility]. No BM this AM, though patient felt as if he had to go. Monitor for fever  - Surgical biopsy results pending - Heme/Onc following, appreciate recommendations - Changed Tylenol  to PRN, monitor for any fever, if fever present will order CBC, CMP, blood cxs, chest xray - Pain regimen: Tylenol  1000 mg q6h PRN, tizanidine 4 mg q8h prn; CTS d/c oxycodone  and fentanyl  today 11/8 - Zofran  PRN for N/V - Fluids: maintenance LR at 125 mL/hr - Labs: Mag, Phos, CMP, CBC - Bowel regimen: Miralax daily, f/u on BM - Fall precautions - Delirium precautions  Pneumothorax after biopsy Xray done 11/7 with 5% left apical pneumothorax. Improved CXR shows pneumothorax slightly decreased in size Discitis Patient continued on abx per ID during admission. Missed dose of dapto earlier in admission, completed IV Daptomycin 11/8 Paroxysmal atrial fibrillation (HCC) Noted outpatient; fu with cards later this month for management. - Pt on Tele  - Toprol  50 mg  daily for rhythm and rate control  - Eliquis 5 mg BID Difficulty urinating Urine retention Failed bladder scan x3 11/7 and Foley was placed. Foley will be removed outpatient with [facility], number provided in d/c instructions. No evidence of UTI on UA.  - Flomax 0.4 mg daily - Plan to keep Foley 5 days - Call [facility] Monday and schedule appt for removal Severe malnutrition Reports appetite continues to improve on Remeron - RD following, appreciate recommendations - Ensure BID between meals - Multivitamin daily - Thiamine 100 mg daily - Remeron 15 mg daily at bedtime - Monitoring labs for refeeding Hypokalemia  Hypomagnesemia  Hypophosphatemia Phos increased to 2.6 from 2.1 -K 3.7, replete with oral potassium this am - Mag of 1.7 this am, replete with 2g Mag IV this am - Daily Mag, Phos, with AM CMP Chronic health problem Reflux: Protonix 40 mg daily   FEN/GI: Regular PPx: Eliquis Dispo: Recommendations are for Home Health at this time  Subjective:  Patient sitting up in bed with daughter at bedside. States he is continuing to feel well, and feels as though he needs to have a BM. No confusion noted by daughter this morning. Uncomfortable on abdominal palpation, but patient states its because of his full bladder.   Objective: Temp:  [97.8 F (36.6 C)-100 F (37.8 C)] 99 F (37.2 C) (11/09 0338) Pulse Rate:  [68-83] 74 (11/09 0338) Resp:  [15-24] 24 (11/09 0338) BP: (111-130)/(44-58) 128/50 (11/09 0338) SpO2:  [94 %-97 %] 94 % (11/09 0338) Weight:  [84.8 kg] 84.8 kg (11/09 0338)  Physical Exam:  General: Cachetic-appearing, no acute distress Cardio: Regular rate, regular rhythm, no murmurs on exam. Pulm: Clear, no wheezing, no crackles. No increased work of breathing Abdominal: bowel sounds present, soft, non-tender, non-distended Extremities: no peripheral edema  Neuro: alert and oriented x3, speech normal in content Derm: Surgical site clean with no  erythema, edema, warmth, or drainage.     Laboratory: Most recent CBC Lab Results  Component Value Date   WBC 8.8 06/02/2024   HGB 8.6 (L) 06/02/2024   HCT 26.4 (L) 06/02/2024   MCV 84.9 06/02/2024   PLT 219 06/02/2024   Most recent BMP    Latest Ref Rng & Units 06/02/2024    3:26 AM  BMP  Glucose 70 - 99 mg/dL 92   BUN 8 - 23 mg/dL 13   Creatinine 9.38 - 1.24 mg/dL 9.14   Sodium 864 - 854 mmol/L 138   Potassium 3.5 - 5.1 mmol/L 3.7   Chloride 98 - 111 mmol/L 102   CO2 22 - 32 mmol/L 23   Calcium 8.9 - 10.3 mg/dL 8.4     Urinalysis: Appearance: Hazy, Bacteria: Rare, Leukocytes: Trace  Imaging/Diagnostic Tests: Chest Xray: Small L pneumothorax decreased in size from previous exam.   Elodie Palma, MD 06/02/2024, 7:38 AM  PGY-1, [facility] FPTS Intern pager: [phone], text pages welcome Secure chat group [facility] Teaching Service

## 2024-06-02 NOTE — Assessment & Plan Note (Signed)
 Noted outpatient; fu with cards later this month for management. - Pt on Tele  - Toprol  50 mg daily for rhythm and rate control  - Eliquis 5 mg BID

## 2024-06-02 NOTE — Assessment & Plan Note (Addendum)
 Ca of 8.4 today, stable from yesterday at 8.4, corrected Ca 9.3. Per Heme, pt can follow-up with oncology op and does not need transfer to [facility]. No BM this AM, though patient felt as if he had to go. Monitor for fever  - Surgical biopsy results pending - Heme/Onc following, appreciate recommendations - Changed Tylenol  to PRN, monitor for any fever, if fever present will order CBC, CMP, blood cxs, chest xray - Pain regimen: Tylenol  1000 mg q6h PRN, tizanidine 4 mg q8h prn; CTS d/c oxycodone  and fentanyl  today 11/8 - Zofran  PRN for N/V - Fluids: maintenance LR at 125 mL/hr - Labs: Mag, Phos, CMP, CBC - Bowel regimen: Miralax daily, f/u on BM - Fall precautions - Delirium precautions

## 2024-06-02 NOTE — Plan of Care (Cosign Needed)
 FMTS Brief Progress Note  S:  Messages through EPIC by bedside RN concerning tachycardia in high 130s. Patient asymptomatic - wife at bedside. Denies palpitation, SOB, or chest pain.    O: BP (!) 161/82   Pulse (!) 131   Temp 99.8 F (37.7 C)   Resp 16   Ht 6' (1.829 m)   Wt 84.8 kg   SpO2 95%   BMI 25.35 kg/m      A/P: Tachycardia - EKG show A-fib w/ RVR in high 130-140s - Given 5 mg IV Metoprolol  once. - Will continue to monitor  Suzen Houston NOVAK, DO 06/02/2024, 10:59 PM PGY-1, [facility] Family Medicine Night Resident  Please page [phone] with questions.

## 2024-06-02 NOTE — Assessment & Plan Note (Addendum)
 Failed bladder scan x3 11/7 and Foley was placed. Foley will be removed outpatient with [facility], number provided in d/c instructions. No evidence of UTI on UA.  - Flomax 0.4 mg daily - Plan to keep Foley 5 days - Call [facility] Monday and schedule appt for removal

## 2024-06-02 NOTE — Assessment & Plan Note (Addendum)
 Xray done 11/7 with 5% left apical pneumothorax. Improved CXR shows pneumothorax slightly decreased in size

## 2024-06-02 NOTE — Assessment & Plan Note (Addendum)
 Reports appetite continues to improve on Remeron - RD following, appreciate recommendations - Ensure BID between meals - Multivitamin daily - Thiamine 100 mg daily - Remeron 15 mg daily at bedtime - Monitoring labs for refeeding

## 2024-06-02 NOTE — Plan of Care (Signed)
  Problem: Activity: Goal: Risk for activity intolerance will decrease Outcome: Progressing   Problem: Education: Goal: Knowledge of the prescribed therapeutic regimen will improve Outcome: Progressing

## 2024-06-02 NOTE — Assessment & Plan Note (Signed)
 Reflux: Protonix 40 mg daily

## 2024-06-02 NOTE — Assessment & Plan Note (Signed)
 Patient continued on abx per ID during admission. Missed dose of dapto earlier in admission, completed IV Daptomycin 11/8

## 2024-06-02 NOTE — Assessment & Plan Note (Addendum)
 Phos increased to 2.6 from 2.1 -K 3.7, replete with oral potassium this am - Mag of 1.7 this am, replete with 2g Mag IV this am - Daily Mag, Phos, with AM CMP

## 2024-06-03 ENCOUNTER — Telehealth (HOSPITAL_COMMUNITY): Payer: Self-pay

## 2024-06-03 ENCOUNTER — Other Ambulatory Visit (HOSPITAL_COMMUNITY): Payer: Self-pay

## 2024-06-03 DIAGNOSIS — E878 Other disorders of electrolyte and fluid balance, not elsewhere classified: Secondary | ICD-10-CM | POA: Insufficient documentation

## 2024-06-03 DIAGNOSIS — J9859 Other diseases of mediastinum, not elsewhere classified: Secondary | ICD-10-CM | POA: Diagnosis not present

## 2024-06-03 LAB — CBC
HCT: 27.1 % — ABNORMAL LOW (ref 39.0–52.0)
Hemoglobin: 9 g/dL — ABNORMAL LOW (ref 13.0–17.0)
MCH: 28 pg (ref 26.0–34.0)
MCHC: 33.2 g/dL (ref 30.0–36.0)
MCV: 84.4 fL (ref 80.0–100.0)
Platelets: 226 K/uL (ref 150–400)
RBC: 3.21 MIL/uL — ABNORMAL LOW (ref 4.22–5.81)
RDW: 16.4 % — ABNORMAL HIGH (ref 11.5–15.5)
WBC: 8.7 K/uL (ref 4.0–10.5)
nRBC: 0 % (ref 0.0–0.2)

## 2024-06-03 LAB — COMPREHENSIVE METABOLIC PANEL WITH GFR
ALT: 12 U/L (ref 0–44)
AST: 12 U/L — ABNORMAL LOW (ref 15–41)
Albumin: 2.3 g/dL — ABNORMAL LOW (ref 3.5–5.0)
Alkaline Phosphatase: 114 U/L (ref 38–126)
Anion gap: 9 (ref 5–15)
BUN: 13 mg/dL (ref 8–23)
CO2: 23 mmol/L (ref 22–32)
Calcium: 8.5 mg/dL — ABNORMAL LOW (ref 8.9–10.3)
Chloride: 105 mmol/L (ref 98–111)
Creatinine, Ser: 0.72 mg/dL (ref 0.61–1.24)
GFR, Estimated: >60 mL/min (ref >60)
Glucose, Bld: 106 mg/dL — ABNORMAL HIGH (ref 70–99)
Potassium: 3.9 mmol/L (ref 3.5–5.1)
Sodium: 137 mmol/L (ref 135–145)
Total Bilirubin: 0.3 mg/dL (ref 0.0–1.2)
Total Protein: 5.8 g/dL — ABNORMAL LOW (ref 6.5–8.1)

## 2024-06-03 LAB — MAGNESIUM: Magnesium: 1.5 mg/dL — ABNORMAL LOW (ref 1.7–2.4)

## 2024-06-03 LAB — PHOSPHORUS: Phosphorus: 1.8 mg/dL — ABNORMAL LOW (ref 2.5–4.6)

## 2024-06-03 MED ORDER — POTASSIUM PHOSPHATES 15 MMOLE/5ML IV SOLN
30.0000 mmol | Freq: Once | INTRAVENOUS | Status: AC
  Administered 2024-06-03: 30 mmol via INTRAVENOUS
  Filled 2024-06-03: qty 10

## 2024-06-03 MED ORDER — POLYETHYLENE GLYCOL 3350 17 G PO PACK
17.0000 g | PACK | Freq: Two times a day (BID) | ORAL | Status: DC
  Administered 2024-06-04 – 2024-06-05 (×3): 17 g via ORAL
  Filled 2024-06-03 (×4): qty 1

## 2024-06-03 MED ORDER — POTASSIUM CHLORIDE CRYS ER 20 MEQ PO TBCR
20.0000 meq | EXTENDED_RELEASE_TABLET | Freq: Once | ORAL | Status: AC
  Administered 2024-06-03: 20 meq via ORAL
  Filled 2024-06-03: qty 1

## 2024-06-03 MED ORDER — MAGNESIUM SULFATE 2 GM/50ML IV SOLN
2.0000 g | Freq: Once | INTRAVENOUS | Status: AC
  Administered 2024-06-03: 2 g via INTRAVENOUS
  Filled 2024-06-03: qty 50

## 2024-06-03 MED ORDER — SODIUM PHOSPHATES 45 MMOLE/15ML IV SOLN
15.0000 mmol | Freq: Once | INTRAVENOUS | Status: AC
  Administered 2024-06-03: 15 mmol via INTRAVENOUS
  Filled 2024-06-03: qty 5

## 2024-06-03 MED ORDER — THIAMINE HCL 100 MG/ML IJ SOLN
500.0000 mg | Freq: Once | INTRAVENOUS | Status: AC
  Administered 2024-06-03: 500 mg via INTRAVENOUS
  Filled 2024-06-03: qty 500

## 2024-06-03 NOTE — Care Management Important Message (Signed)
 Important Message  Patient Details  Name: David [name] MRN: [id number] Date of Birth: 1951-04-25   Important Message Given:  Yes - Medicare IM     Claretta Deed 06/03/2024, 2:39 PM

## 2024-06-03 NOTE — Assessment & Plan Note (Addendum)
 Mod-Sev refeeding syndrome, plan to be more aggressive with repleting, as we have been repleting since 6th.  - Additional 2g Mag IV, 4g total today - Give 30mmol Kphos IV, 45 mEq total - BID ordering Phos, Mag, BMP  - 500 thiamine IV once, retime oral to start tomorrow - RD following, appreciate recommendations - Ensure BID between meals - Multivitamin daily - Remeron 15 mg daily at bedtime

## 2024-06-03 NOTE — Assessment & Plan Note (Addendum)
 Failed bladder scan x3 11/7 and Foley was placed. Foley will be removed outpatient with [facility]. No evidence of UTI on UA.  - Flomax 0.4 mg daily - Plan to keep Foley 5 days - Appointment made [facility] for 11/13

## 2024-06-03 NOTE — Progress Notes (Addendum)
 Daily Progress Note Intern Pager: [phone]  Patient name: David [name] Medical record number: [id number] Date of birth: 01-14-1951 Age: 73 y.o. Gender: male  Primary Care Provider: Health, [address] Consultants: Heme/Onc, CT Surgery Code Status: Full  Pt Overview and Major Events to Date:  11/4: Admitted to FMTS 11/6: L VATS biopsy of anterior mediastinal mass  Assessment and Plan: David [name] is a 73 y.o. male with a PMH of discitis, hx of penile cancer (2023). Admitted for weakness and aphagia and found to have a new mediastinal mass with hypercalcermia. Underwent biopsy of mass, results pending. 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 Will keep until electrolytes stabilize. Monitor HR, consider increasing Metop from 50-100mg  if increases.   Assessment & Plan Hypercalcemia (Resolved: 06/03/2024) Mediastinal mass Ca stable 8.5, No BM this AM. Monitor for emergence of fever now off scheduled tylenol   - Surgical biopsy results pending - Heme/Onc following, appreciate recommendations - Changed Tylenol  to PRN, monitor for any fever, if fever present will order CBC, CMP, blood cxs, chest xray - Pain regimen: Tylenol  1000 mg q6h PRN, tramadol 50mg  q6 PRN; nurse consulted to hold Tylenol  unless fever of 100.4 - Zofran  4mg  q6 PRN for N/V - No BM in days, increase bowel regimen to BID Miralax and SenokotS daily Hypokalemia  Hypomagnesemia  Hypophosphatemia Refeeding syndrome Severe malnutrition Mod-Sev refeeding syndrome, plan to be more aggressive with repleting, as we have been repleting since 6th.  - Additional 2g Mag IV, 4g total today - Give 30mmol Kphos IV, 45 mEq total - BID ordering Phos, Mag, BMP  - 500 thiamine IV once, retime oral to start tomorrow - RD following, appreciate recommendations - Ensure BID between meals - Multivitamin daily - Remeron 15 mg daily at  bedtime Difficulty urinating Urinary retention Failed bladder scan x3 11/7 and Foley was placed. Foley will be removed outpatient with [facility]. No evidence of UTI on UA.  - Flomax 0.4 mg daily - Plan to keep Foley 5 days - Appointment made [facility] for 11/13 Paroxysmal atrial fibrillation (HCC) Tachy overnight with episode of A-fib w/RVR, s/p IV metrox1, convert back to NS - Pt on Tele  - Toprol  XL 50 mg daily for rhythm and rate control  - Eliquis 5 mg BID Chronic health problem Reflux: Protonix 40 mg daily   FEN/GI: Regular Diet PPx: Eliquis Dispo:Home today. Barriers include Electrolyte abnormalities, BM, and monitor for fevers   Subjective:  Sitting upright in bed with daughters at bedside. Continues to feel good, complaint of nose bleed this morning. Consulted at higher risk of nose bleeds on his Eliquis.   Objective: Temp:  [98.6 F (37 C)-99.8 F (37.7 C)] 98.6 F (37 C) (11/10 0337) Pulse Rate:  [74-131] 74 (11/10 0337) Resp:  [16-25] 19 (11/10 0337) BP: (131-161)/(47-82) 137/58 (11/10 0337) SpO2:  [93 %-95 %] 93 % (11/10 0337) Weight:  [81.3 kg] 81.3 kg (11/10 0537)  Physical Exam General: Wee-appearing, no acute distress Cardio: Regular rate, regular rhythm, no murmurs on exam. Pulm: Clear, no wheezing, no crackles. No increased work of breathing Abdominal: bowel sounds present, soft, non-tender, non-distended Extremities: no peripheral edema   Laboratory: Most recent CBC Lab Results  Component Value Date   WBC 8.7 06/03/2024   HGB 9.0 (L) 06/03/2024   HCT 27.1 (L) 06/03/2024   MCV 84.4 06/03/2024   PLT 226 06/03/2024   Most recent BMP  Latest Ref Rng & Units 06/03/2024    3:34 AM  BMP  Glucose 70 - 99 mg/dL 893   BUN 8 - 23 mg/dL 13   Creatinine 9.38 - 1.24 mg/dL 9.27   Sodium 864 - 854 mmol/L 137   Potassium 3.5 - 5.1 mmol/L 3.9   Chloride 98 - 111 mmol/L 105   CO2 22 - 32 mmol/L 23   Calcium 8.9 - 10.3 mg/dL 8.5    Mag:  1.5 Phosh: 2.6  David [name], David [name] 06/03/2024, 7:42 AM  PGY-1, [facility] FPTS Intern pager: [phone], text pages welcome Secure chat group [facility] Teaching Service

## 2024-06-03 NOTE — Assessment & Plan Note (Signed)
 Tachy overnight with episode of A-fib w/RVR, s/p IV metrox1, convert back to NS - Pt on Tele  - Toprol  XL 50 mg daily for rhythm and rate control  - Eliquis 5 mg BID

## 2024-06-03 NOTE — Assessment & Plan Note (Deleted)
 Xray done 11/7 with 5% left apical pneumothorax. Improved CXR shows pneumothorax slightly decreased in size

## 2024-06-03 NOTE — Progress Notes (Signed)
 Mobility Specialist Progress Note;   06/03/24 1417  Mobility  Activity Ambulated with assistance;Pivoted/transferred from chair to bed  Level of Assistance Standby assist, set-up cues, supervision of patient - no hands on  Assistive Device Front wheel walker  Distance Ambulated (ft) 150 ft  Activity Response Tolerated well  Mobility Referral Yes  Mobility visit 1 Mobility  Mobility Specialist Start Time (ACUTE ONLY) 1417  Mobility Specialist Stop Time (ACUTE ONLY) 1429  Mobility Specialist Time Calculation (min) (ACUTE ONLY) 12 min   Pt in chair upon arrival, eager for mobility. Required no physical assistance during ambulation, SV for safety. VSS throughout and no c/o pain when asked. Assisted BLE back into bed once finished. Pt left comfortably in bed with all needs met, alarm on. Family present.   Lauraine Erm Mobility Specialist Please contact via SecureChat or [employer] [phone]

## 2024-06-03 NOTE — Progress Notes (Signed)
 Occupational Therapy Treatment Patient Details Name: David [name] MRN: [id number] DOB: 01-Jul-1951 Today's Date: 06/03/2024   History of present illness 73 y.o. male presenting to ED 10/21 with generalized weakness and worsening aphasia for the last 72 hours. 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 Calcium was found to be 13.1. 11/6 s/p VATS with bx of L mediastinal mass. 11/7 L CT removed in AM. PMH: s/p C5-C6-C7 decompressive laminectomy with evacuation of cervical epidural abscess 9/26, HTN, GERD, colon polyp, and skin cancer.   OT comments  Patient continues to demonstrate good gains with OT treatment for bed mobility, activity tolerance, and transfers.  Patient able to stand at sink and complete grooming tasks before requesting seated break before performing mobility.  Discharge recommendations continue to be appropriate for HHOT to follow. Acute OT to continue to follow to address established goals.       If plan is discharge home, recommend the following:  A lot of help with bathing/dressing/bathroom;Assistance with cooking/housework;Direct supervision/assist for medications management;Direct supervision/assist for financial management;Assist for transportation;Help with stairs or ramp for entrance;A little help with walking and/or transfers   Equipment Recommendations  BSC/3in1 (other TBD based on pt's progress)    Recommendations for Other Services      Precautions / Restrictions Precautions Precautions: Fall Recall of Precautions/Restrictions: Intact Precaution/Restrictions Comments: recent fall day PTA Restrictions Weight Bearing Restrictions Per Provider Order: No       Mobility Bed Mobility Overal bed mobility: Needs Assistance Bed Mobility: Supine to Sit     Supine to sit: Min assist, HOB elevated, Used rails     General bed mobility comments: cues for body mechanics and min assist    Transfers Overall transfer level:  Needs assistance Equipment used: Rolling walker (2 wheels), None Transfers: Sit to/from Stand, Bed to chair/wheelchair/BSC Sit to Stand: Contact guard assist           General transfer comment: CGA to stand from EOB and chair with cues for hand placement.  Able to ambulate to sink from EOB with HHA and RW for longer distances     Balance Overall balance assessment: Needs assistance Sitting-balance support: Feet supported, No upper extremity supported Sitting balance-Leahy Scale: Fair Sitting balance - Comments: EOB   Standing balance support: During functional activity, Bilateral upper extremity supported Standing balance-Leahy Scale: Poor Standing balance comment: reliant on external support when standing                           ADL either performed or assessed with clinical judgement   ADL Overall ADL's : Needs assistance/impaired     Grooming: Wash/dry hands;Wash/dry face;Oral care;Contact guard assist;Standing Grooming Details (indicate cue type and reason): at sink without seated rest break                 Toilet Transfer: Contact guard assist;Rolling walker (2 wheels) Toilet Transfer Details (indicate cue type and reason): simulated           General ADL Comments: demonstrating increased activity tolerance    Extremity/Trunk Assessment              Vision       Perception     Praxis     Communication Communication Communication: No apparent difficulties   Cognition Arousal: Alert Behavior During Therapy: WFL for tasks assessed/performed Cognition: Cognition impaired         Attention impairment (select first level of  impairment): Selective attention                     Following commands: Impaired Following commands impaired: Follows one step commands with increased time, Follows multi-step commands with increased time      Cueing   Cueing Techniques: Verbal cues, Gestural cues, Visual cues  Exercises       Shoulder Instructions       General Comments HR increased to 112 with mobility    Pertinent Vitals/ Pain       Pain Assessment Pain Assessment: Faces Faces Pain Scale: Hurts a little bit Pain Location: left flank/ribs Pain Descriptors / Indicators: Discomfort, Grimacing Pain Intervention(s): Limited activity within patient's tolerance, Monitored during session, Repositioned  Home Living                                          Prior Functioning/Environment              Frequency  Min 2X/week        Progress Toward Goals  OT Goals(current goals can now be found in the care plan section)  Progress towards OT goals: Progressing toward goals  Acute Rehab OT Goals Patient Stated Goal: to go home OT Goal Formulation: With patient/family Time For Goal Achievement: 06/12/24 Potential to Achieve Goals: Good ADL Goals Pt Will Perform Grooming: with contact guard assist;standing Pt Will Perform Upper Body Bathing: with supervision;sitting Pt Will Perform Lower Body Bathing: with contact guard assist;sitting/lateral leans;sit to/from stand Pt Will Perform Lower Body Dressing: with contact guard assist;sitting/lateral leans;sit to/from stand Pt Will Transfer to Toilet: with min assist;ambulating;bedside commode (with least restrictive AD) Pt Will Perform Toileting - Clothing Manipulation and hygiene: with contact guard assist;sit to/from stand  Plan      Co-evaluation                 AM-PAC OT 6 Clicks Daily Activity     Outcome Measure   Help from another person eating meals?: A Little Help from another person taking care of personal grooming?: A Little Help from another person toileting, which includes using toliet, bedpan, or urinal?: A Lot Help from another person bathing (including washing, rinsing, drying)?: A Lot Help from another person to put on and taking off regular upper body clothing?: A Little Help from another person to put on  and taking off regular lower body clothing?: A Lot 6 Click Score: 15    End of Session Equipment Utilized During Treatment: Gait belt;Rolling walker (2 wheels)  OT Visit Diagnosis: Muscle weakness (generalized) (M62.81);Other symptoms and signs involving cognitive function   Activity Tolerance Patient tolerated treatment well;Patient limited by fatigue   Patient Left in chair;with call bell/phone within reach;with family/visitor present   Nurse Communication Mobility status        Time: 8977-8953 OT Time Calculation (min): 24 min  Charges: OT General Charges $OT Visit: 1 Visit OT Treatments $Self Care/Home Management : 8-22 mins $Therapeutic Activity: 8-22 mins  Dick Laine, OTA Acute Rehabilitation Services  Office [phone]   Jeb LITTIE Laine 06/03/2024, 1:21 PM

## 2024-06-03 NOTE — Assessment & Plan Note (Deleted)
 Patient continued on abx per ID during admission. Missed dose of dapto earlier in admission, completed IV Daptomycin 11/8

## 2024-06-03 NOTE — Plan of Care (Signed)
  Problem: Education: Goal: Knowledge of disease or condition will improve Outcome: Progressing   Problem: Activity: Goal: Risk for activity intolerance will decrease Outcome: Progressing   

## 2024-06-03 NOTE — Plan of Care (Signed)
  Problem: Education: Goal: Knowledge of disease or condition will improve Outcome: Progressing Goal: Knowledge of the prescribed therapeutic regimen will improve Outcome: Progressing   Problem: Activity: Goal: Risk for activity intolerance will decrease Outcome: Progressing

## 2024-06-03 NOTE — Assessment & Plan Note (Addendum)
 Ca stable 8.5, No BM this AM. Monitor for emergence of fever now off scheduled tylenol   - Surgical biopsy results pending - Heme/Onc following, appreciate recommendations - Changed Tylenol  to PRN, monitor for any fever, if fever present will order CBC, CMP, blood cxs, chest xray - Pain regimen: Tylenol  1000 mg q6h PRN, tramadol 50mg  q6 PRN; nurse consulted to hold Tylenol  unless fever of 100.4 - Zofran  4mg  q6 PRN for N/V - No BM in days, increase bowel regimen to BID Miralax and SenokotS daily

## 2024-06-03 NOTE — TOC Progression Note (Signed)
 Transition of Care [facility]) - Progression Note    Patient Details  Name: David [name] MRN: [id number] Date of Birth: [birth year]  Transition of Care [facility]) CM/SW Contact  [name], RN Phone Number: 06/03/2024, 12:20 PM  Clinical Narrative:    Janese Sor with [facility] of possible plan to discharge home tomorrow with foley.                     Expected Discharge Plan and Services                                               Social Drivers of Health (SDOH) Interventions SDOH Screenings   Food Insecurity: No Food Insecurity (05/28/2024)  Housing: Low Risk  (05/28/2024)  Transportation Needs: No Transportation Needs (05/28/2024)  Utilities: Not At Risk (05/28/2024)  Social Connections: Moderately Integrated (05/28/2024)  Tobacco Use: Low Risk  (05/30/2024)    Readmission Risk Interventions    05/29/2024    3:49 PM  Readmission Risk Prevention Plan  Post Dischage Appt Complete  Medication Screening Complete  Transportation Screening Complete

## 2024-06-03 NOTE — Telephone Encounter (Signed)
 Pharmacy Patient Advocate Encounter  Insurance verification completed.    The patient is insured through [employer]. Patient has Medicare and is not eligible for a copay card, but may be able to apply for patient assistance or Medicare RX Payment Plan (Patient Must reach out to their plan, if eligible for payment plan), if available.    Ran test claim for Eliquis 5mg  and the current 30 day co-pay is $0.   This test claim was processed through [employer]- copay amounts may vary at other pharmacies due to [employer], or as the patient moves through the different stages of their insurance plan.

## 2024-06-03 NOTE — Assessment & Plan Note (Signed)
 Reflux: Protonix 40 mg daily

## 2024-06-04 DIAGNOSIS — R040 Epistaxis: Secondary | ICD-10-CM

## 2024-06-04 DIAGNOSIS — J9859 Other diseases of mediastinum, not elsewhere classified: Secondary | ICD-10-CM | POA: Diagnosis not present

## 2024-06-04 DIAGNOSIS — R339 Retention of urine, unspecified: Secondary | ICD-10-CM | POA: Diagnosis not present

## 2024-06-04 LAB — PHOSPHORUS: Phosphorus: 2.6 mg/dL (ref 2.5–4.6)

## 2024-06-04 LAB — BASIC METABOLIC PANEL WITH GFR
Anion gap: 10 (ref 5–15)
BUN: 10 mg/dL (ref 8–23)
CO2: 21 mmol/L — ABNORMAL LOW (ref 22–32)
Calcium: 8.3 mg/dL — ABNORMAL LOW (ref 8.9–10.3)
Chloride: 102 mmol/L (ref 98–111)
Creatinine, Ser: 0.72 mg/dL (ref 0.61–1.24)
GFR, Estimated: >60 mL/min (ref >60)
Glucose, Bld: 124 mg/dL — ABNORMAL HIGH (ref 70–99)
Potassium: 4.5 mmol/L (ref 3.5–5.1)
Sodium: 133 mmol/L — ABNORMAL LOW (ref 135–145)

## 2024-06-04 LAB — CBC
HCT: 29.1 % — ABNORMAL LOW (ref 39.0–52.0)
Hemoglobin: 9.3 g/dL — ABNORMAL LOW (ref 13.0–17.0)
MCH: 28 pg (ref 26.0–34.0)
MCHC: 32 g/dL (ref 30.0–36.0)
MCV: 87.7 fL (ref 80.0–100.0)
Platelets: 200 K/uL (ref 150–400)
RBC: 3.32 MIL/uL — ABNORMAL LOW (ref 4.22–5.81)
RDW: 16.4 % — ABNORMAL HIGH (ref 11.5–15.5)
WBC: 8.1 K/uL (ref 4.0–10.5)
nRBC: 0 % (ref 0.0–0.2)

## 2024-06-04 LAB — MAGNESIUM: Magnesium: 1.8 mg/dL (ref 1.7–2.4)

## 2024-06-04 MED ORDER — MAGNESIUM SULFATE 4 GM/100ML IV SOLN: 4.0000 g | Freq: Once | INTRAVENOUS | Status: DC

## 2024-06-04 MED ORDER — METOPROLOL TARTRATE 5 MG/5ML IV SOLN
5.0000 mg | Freq: Once | INTRAVENOUS | Status: AC
  Administered 2024-06-04: 5 mg via INTRAVENOUS
  Filled 2024-06-04: qty 5

## 2024-06-04 MED ORDER — K PHOS MONO-SOD PHOS DI & MONO 155-852-130 MG PO TABS
500.0000 mg | ORAL_TABLET | Freq: Three times a day (TID) | ORAL | Status: AC
  Administered 2024-06-04 (×3): 500 mg via ORAL
  Filled 2024-06-04 (×3): qty 2

## 2024-06-04 MED ORDER — METOPROLOL SUCCINATE ER 100 MG PO TB24
100.0000 mg | ORAL_TABLET | Freq: Every day | ORAL | Status: DC
  Administered 2024-06-05 – 2024-06-06 (×2): 100 mg via ORAL
  Filled 2024-06-04 (×2): qty 1

## 2024-06-04 MED ORDER — METOPROLOL SUCCINATE ER 50 MG PO TB24
50.0000 mg | ORAL_TABLET | Freq: Once | ORAL | Status: AC
  Administered 2024-06-04: 50 mg via ORAL
  Filled 2024-06-04: qty 1

## 2024-06-04 MED ORDER — MAGNESIUM SULFATE 2 GM/50ML IV SOLN
2.0000 g | Freq: Once | INTRAVENOUS | Status: AC
  Administered 2024-06-04: 2 g via INTRAVENOUS
  Filled 2024-06-04: qty 50

## 2024-06-04 MED ORDER — POTASSIUM CHLORIDE CRYS ER 20 MEQ PO TBCR
20.0000 meq | EXTENDED_RELEASE_TABLET | Freq: Two times a day (BID) | ORAL | Status: DC
Start: 2024-06-04 — End: 2024-06-04

## 2024-06-04 NOTE — Assessment & Plan Note (Addendum)
 Monitor refeeding lab closely, will discharge once electrolyte stability maintained. Mag 1.8 - 2g Mag IV - Phos 500 mg tablet  - Daily ordering Phos, Mag, BMP  - Thiamine 500 mg daily - RD following, appreciate recommendations - Ensure BID between meals - Multivitamin daily - Remeron 15 mg daily at bedtime

## 2024-06-04 NOTE — Progress Notes (Signed)
 BRIEF ONCOLOGY NOTE:  David [name]   DOB:09-Sep-1950   FM#:[id number]      ASSESSMENT & PLAN:  David [name] is status post thoracoscopy on 05/31/24.  Path remains pending.  Plan is if this is lymphoma, patient will be evaluated and treatment recommendations will be done by Dr. [facility].  Will continue to monitor for results.        Olam PARAS David Blumer, NP 06/04/2024 8:50 AM    Labs Reviewed:  Lab Results  Component Value Date   WBC 8.1 06/04/2024   HGB 9.3 (L) 06/04/2024   HCT 29.1 (L) 06/04/2024   MCV 87.7 06/04/2024   PLT 200 06/04/2024   Recent Labs    06/01/24 0314 06/01/24 1817 06/02/24 0326 06/03/24 0334 06/04/24 0802  NA 138   < > 138 137 133*  K 3.3*   < > 3.7 3.9 4.5  CL 102   < > 102 105 102  CO2 22   < > 23 23 21*  GLUCOSE 91   < > 92 106* 124*  BUN 17   < > 13 13 10   CREATININE 0.87   < > 0.85 0.72 0.72  CALCIUM 8.7*   < > 8.4* 8.5* 8.3*  GFRNONAA >60   < > >60 >60 >60  PROT 6.0*  --  5.7* 5.8*  --   ALBUMIN 2.5*  --  2.4* 2.3*  --   AST 14*  --  13* 12*  --   ALT 10  --  10 12  --   ALKPHOS 110  --  110 114  --   BILITOT 0.6  --  0.5 0.3  --    < > = values in this interval not displayed.    Studies Reviewed:  Surgical Path pending.

## 2024-06-04 NOTE — Assessment & Plan Note (Addendum)
 Intermittent tachycardia s/p removal foley with increased ambulation  - Additional toprol  xl 50 mg, then toprol  100mg  daily for rhythm and rate control - Eliquis 5 mg BID

## 2024-06-04 NOTE — Progress Notes (Signed)
 Physical Therapy Treatment Patient Details Name: David [name] MRN: [id number] DOB: February 17, 1951 Today's Date: 06/04/2024   History of Present Illness 73 y.o. male presenting to ED 10/21 with generalized weakness and worsening aphasia for the last 72 hours. 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 Calcium was found to be 13.1. 11/6 s/p VATS with bx of L mediastinal mass. 11/7 L CT removed in AM. PMH: s/p C5-C6-C7 decompressive laminectomy with evacuation of cervical epidural abscess 9/26, HTN, GERD, colon polyp, and skin cancer.    PT Comments  Pt received in supine, pt agreeable to gait trial with SPTA. Pt and daughter expressed concerns with pts HR being tachy with exertion earlier today, but VSS on RA throughout. Pt c/o dizziness after transfer to EOB, standing BP taken and found to be WNL. Pt performed 10 reps of foot taps on stool and ambulated 41ft CGA +2 for line mgmt, verbal cues required for improved heel strike on L LE for safety with gait. Pt performed step ups in hallway to simulate home environment, needing Mod A due to weakness in LE, verbal cues required for posture on step. Pt continues to benefit from PT services to progress toward functional mobility goals, continue to recommend HHPT upon DC.    If plan is discharge home, recommend the following: A little help with walking and/or transfers;A little help with bathing/dressing/bathroom;Assistance with cooking/housework;Direct supervision/assist for medications management;Direct supervision/assist for financial management;Assist for transportation;Help with stairs or ramp for entrance;Supervision due to cognitive status   Can travel by private vehicle        Equipment Recommendations  None recommended by PT    Recommendations for Other Services       Precautions / Restrictions Precautions Precautions: Fall Recall of Precautions/Restrictions: Intact Precaution/Restrictions Comments: recent  fall day PTA Restrictions Weight Bearing Restrictions Per Provider Order: No     Mobility  Bed Mobility Overal bed mobility: Needs Assistance Bed Mobility: Supine to Sit, Sit to Supine Rolling: Contact guard assist, Used rails   Supine to sit: HOB elevated, Used rails, Mod assist Sit to supine: Min assist, HOB elevated   General bed mobility comments: Cues for sequencing of bed mobility and assist of LE going from sit to supine    Transfers Overall transfer level: Needs assistance Equipment used: Rolling walker (2 wheels) Transfers: Sit to/from Stand Sit to Stand: Min assist, +2 safety/equipment           General transfer comment: Min A to stand from EOB for line mgmnt and assist with power up due to L flank soreness.    Ambulation/Gait Ambulation/Gait assistance: +2 safety/equipment, Contact guard assist Gait Distance (Feet): 90 Feet Assistive device: Rolling walker (2 wheels) Gait Pattern/deviations: Step-through pattern, Decreased dorsiflexion - left, Knee flexed in stance - left, Decreased stride length       General Gait Details: Pt ambulated 19ft with CGA +2 for line mgmt. Pt expressed LE weakness with gait, stayed near chairs throughout ambulation for safety. Pt HR stayed between 95-100bpm and SpO2 94% and above on RA   Stairs Stairs: Yes Stairs assistance: Mod assist, +2 safety/equipment Stair Management: Two rails, Step to pattern, Backwards, Forwards Number of Stairs: 5 General stair comments: Pt ascended and descended 1 step x 5 reps (7in platform step) requiring Mod A due to LE weakness.   Wheelchair Mobility     Tilt Bed    Modified Rankin (Stroke Patients Only)       Balance Overall balance  assessment: Needs assistance Sitting-balance support: Bilateral upper extremity supported, Feet supported Sitting balance-Leahy Scale: Fair Sitting balance - Comments: EOB   Standing balance support: During functional activity, Bilateral upper  extremity supported, Reliant on assistive device for balance Standing balance-Leahy Scale: Poor Standing balance comment: reliant on external support when standing                            Communication Communication Communication: No apparent difficulties  Cognition Arousal: Alert Behavior During Therapy: WFL for tasks assessed/performed   PT - Cognitive impairments: Safety/Judgement, Sequencing                         Following commands: Intact Following commands impaired: Follows one step commands inconsistently, Follows multi-step commands with increased time    Cueing Cueing Techniques: Verbal cues, Visual cues  Exercises Other Exercises Other Exercises: Standing alternating foot taps on 7 in step x10 reps    General Comments General comments (skin integrity, edema, etc.): Pt HR stayed between 95-100bpm throughout session. Pt BP standing before ambulation 141/63 (87) and 144/58 (83) HR 77 with bed in chair posture HOB 64 degrees. SpO2 94% and above on RA      Pertinent Vitals/Pain Pain Assessment Faces Pain Scale: Hurts a little bit Pain Location: left flank/ribs Pain Descriptors / Indicators: Grimacing, Guarding Pain Intervention(s): Limited activity within patient's tolerance, Monitored during session    Home Living                          Prior Function            PT Goals (current goals can now be found in the care plan section) Acute Rehab PT Goals Patient Stated Goal: Less pain and to move better so I can go home. PT Goal Formulation: With patient/family Time For Goal Achievement: 06/12/24 Progress towards PT goals: Progressing toward goals    Frequency    Min 2X/week      PT Plan      Co-evaluation              AM-PAC PT 6 Clicks Mobility   Outcome Measure  Help needed turning from your back to your side while in a flat bed without using bedrails?: A Little Help needed moving from lying on your back  to sitting on the side of a flat bed without using bedrails?: A Lot Help needed moving to and from a bed to a chair (including a wheelchair)?: A Little Help needed standing up from a chair using your arms (e.g., wheelchair or bedside chair)?: A Little Help needed to walk in hospital room?: A Little Help needed climbing 3-5 steps with a railing? : A Lot 6 Click Score: 16    End of Session Equipment Utilized During Treatment: Gait belt Activity Tolerance: Patient tolerated treatment well Patient left: in bed;with call bell/phone within reach;with family/visitor present (Daughter in room) Nurse Communication: Mobility status;Other (comment) (HR 95-100bpm throughout session) PT Visit Diagnosis: Unsteadiness on feet (R26.81);Muscle weakness (generalized) (M62.81);History of falling (Z91.81);Difficulty in walking, not elsewhere classified (R26.2)     Time: 8552-8484 PT Time Calculation (min) (ACUTE ONLY): 28 min  Charges:    $Gait Training: 8-22 mins $Therapeutic Activity: 8-22 mins PT General Charges $$ ACUTE PT VISIT: 1 Visit  David [name], SPTA  David [name] 06/04/2024, 4:13 PM

## 2024-06-04 NOTE — Assessment & Plan Note (Addendum)
 S/p biopsy, pending pathology report. Per oncology, d/c home when stable for pathology if not available during admission.  - Heme/Onc following, appreciate recommendations - Surgical biopsy results pending - Changed Tylenol  to PRN, monitor for any fever, if fever present will order CBC, CMP, blood cxs, chest xray - Pain regimen: Tylenol  1000 mg q6h PRN, tramadol 50mg  q6 PRN; nurse consulted to hold Tylenol  unless fever of 100.4 - Zofran  4mg  q6 PRN for N/V - Tolerating oral hydration

## 2024-06-04 NOTE — Assessment & Plan Note (Addendum)
 Foley removed, bladder scan pending. Outpatient urology f/u with [facility]  - Flomax 0.4 mg daily - Appointment made [facility] for 11/13

## 2024-06-04 NOTE — Discharge Summary (Incomplete)
 Family Medicine Teaching [facility] Discharge Summary  Patient name: David [name] Medical record number: [id number] Date of birth: April 03, 1951 Age: 73 y.o. Gender: male Date of Admission: 05/28/2024  Date of Discharge: 11/112025 Admitting Physician: Damien Pinal, DO  Primary Care Provider: Health, [facility] Consultants: Heme/Onc  Indication for Hospitalization: Weakness and Aphagia   Discharge Diagnoses/Problem List:  Principal Problem for Admission: Hypercalcemia  Other Problems addressed during stay: Mediastinal mass, transient aphasia, atrial fibrillation, refeeding syndrome, urinary retention, severe malnutrition   Brief Hospital Course:  David [name] is a 73 y.o.male with a history of penile ca, discitis who was admitted to the [facility] at [facility] for Hypercalcemia.  Found to have mediastinal mass during admission.  His hospital course is detailed below:  Hypercalcemia, resolved Mediastinal Mass Presented with new onset aphasia and generalized weakness found to have hypercalcemia with calcium of 13.1 on admission.  Hypercalcemia treated with fluids, calcitonin, zoledronic acid with improvement.  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  Already thoracic surgery was consulted, who performed biopsy of mass on 11/6.  Pathology results are pending at time of discharge.  Heme-onc was also consulted, who recommended outpatient follow-up for further management.  Urinary retention Post biopsy, patient experienced urinary retention with failed bladder scan x 3 and Foley was placed on 11/7.  Also started on Flomax 0.4 mg daily.  At family's request given patient with more confusion from baseline per family report, UA was collected 11/8 without strong evidence of infection.  No further antibiotics were given.  Patient  had foley placed on 11/9 which was removed prior to discharge with bladder scan showing ***. Patient was discharged with an appt with [facility] for follow up s/p foley removal.   Severe malnutrition Patient with decreased appetite and unintentional weight loss.  RD consulted this admission.  Patient supplementing with Ensure between meals, also started on Remeron 15 mg daily at bedtime with improvement in appetite.  Monitored for refeeding, and electrolytes repleted as indicated. On day of discharge, his electrolytes ***.   Other chronic conditions were medically managed with home medications and formulary alternatives as necessary (discitis, PAF, reflux)  PCP Follow-up Recommendations: Follow biopsy results from mediastinal mass Ensure follow-up with outpatient oncology Ensure follow-up with outpatient urology     Results/Tests Pending at Time of Discharge:  Unresulted Labs (From admission, onward)     Start     Ordered   06/04/24 0800  Basic metabolic panel with GFR  2 times daily,   R (with TIMED occurrences)     Question:  Specimen collection method  Answer:  Lab=Lab collect   06/03/24 1033   06/04/24 0800  Phosphorus  2 times daily,   R (with TIMED occurrences)     Question:  Specimen collection method  Answer:  Lab=Lab collect   06/03/24 1033   06/04/24 0800  Magnesium  2 times daily,   R (with TIMED occurrences)     Question:  Specimen collection method  Answer:  Lab=Lab collect   06/03/24 1033   05/30/24 1638  Flow Cytometry Request (Fluid) - Hold for flow at pathologists' discretion.  RELEASE UPON ORDERING,   TIMED       Comments: Specimen 2: Phone [phone]         Previous Biopsy:  no Is the patient on airborne/droplet precautions? No  Clinical History:  mediastinal mass Copy of Report to:  N/A Specimen Disposition: OR Specimen Holding     05/30/24 1638   05/28/24 2200  PTH-related peptide  Once,   R        05/28/24 1857              Disposition: Home Health   Discharge Condition: Stable  Discharge Exam:  Vitals:   06/04/24 0442 06/04/24 0835  BP: (!) 157/71 (!) 149/58  Pulse: 84 84  Resp: (!) 22 20  Temp: 98.7 F (37.1 C) 99 F (37.2 C)  SpO2: 94% 97%   General: Well-appearing, no acute distress Cardio: Regular rate, regular rhythm, no murmurs on exam. Pulm: Clear, no wheezing, no crackles. No increased work of breathing Abdominal: bowel sounds present, soft, non-tender, non-distended Extremities: no peripheral edema  Neuro: alert and oriented x3, speech normal in content, no facial asymmetry  Significant Procedures:  Left VATS Biopsy   Significant Labs and Imaging:  Recent Labs  Lab 06/03/24 0334 06/04/24 0802  WBC 8.7 8.1  HGB 9.0* 9.3*  HCT 27.1* 29.1*  PLT 226 200   Recent Labs  Lab 06/03/24 0334 06/04/24 0802  NA 137 133*  K 3.9 4.5  CL 105 102  CO2 23 21*  GLUCOSE 106* 124*  BUN 13 10  CREATININE 0.72 0.72  CALCIUM 8.5* 8.3*  MG 1.5* 1.8  PHOS 1.8* 2.6  ALKPHOS 114  --   AST 12*  --   ALT 12  --   ALBUMIN 2.3*  --     CT-scan of abdomen and pelvis: Radiology finding -enlarged left periaortic and left external iliac chain lymph nodes most consistent with metastatic disease.   CT-scan of the chest: Radiology finding-large anterior mediastinal mass approximately 5.0 x 8.0 x 11.4 cm partially encasing the proximal right brachiocephalic and left common carotid arteries, with marked narrowing of the left brachiocephalic vein and compression of the SVC.  Enlarged mediastinal lymph nodes including high paratracheal and right hilar nodes.  Subcentimeter thyroid  nodules and a 15 mm peripherally calcified thyroid  isthmus nodule.   Discharge Medications:  Allergies as of 06/04/2024       Reactions   Bee Venom Anaphylaxis, Swelling   Honey Bee Venom Anaphylaxis, Swelling     Med Rec must be completed prior to using this [facility]***       Discharge Instructions: Please  refer to Patient Instructions section of EMR for full details.  Patient was counseled important signs and symptoms that should prompt return to medical care, changes in medications, dietary instructions, activity restrictions, and follow up appointments.   Follow-Up Appointments:  Follow-up Information     Rutha Manuelita HERO, PA-C. Go on 06/11/2024.   Specialties: Physician Assistant, Thoracic Surgery Why: Please arrive by 1:30 pm in order to have a PA/LAT CXR taken PRIOR to office appointment with Manuelita Rutha. CXR located in the same building on the SECOND floor. Appointment time is at 2:30 pm Contact information: [address], [place] [address] [phone]                 Elodie Palma, MD 06/04/2024, 9:14 AM PGY-1, [facility]

## 2024-06-04 NOTE — Progress Notes (Signed)
 5 Days Post-Op Procedure(s) (LRB): VIDEO ASSISTED THORACOSCOPY (VATS)/ LOBECTOMY (N/A) BLOCK, NERVE, INTERCOSTAL (Left) Subjective: No complaints this AM  Objective: Vital signs in last 24 hours: Temp:  [98.2 F (36.8 C)-98.9 F (37.2 C)] 98.7 F (37.1 C) (11/11 0442) Pulse Rate:  [65-87] 84 (11/11 0442) Cardiac Rhythm: Normal sinus rhythm (11/11 0700) Resp:  [18-26] 22 (11/11 0442) BP: (139-158)/(52-71) 157/71 (11/11 0442) SpO2:  [94 %-97 %] 94 % (11/11 0442) Weight:  [81.6 kg] 81.6 kg (11/11 0448)  Hemodynamic parameters for last 24 hours:    Intake/Output from previous day: 11/10 0701 - 11/11 0700 In: 654.5 [P.O.:240; IV Piggyback:414.5] Out: 1600 [Urine:1600] Intake/Output this shift: No intake/output data recorded.  General appearance: alert, cooperative, and no distress Wound: intact  Lab Results: Recent Labs    06/02/24 0326 06/03/24 0334  WBC 8.8 8.7  HGB 8.6* 9.0*  HCT 26.4* 27.1*  PLT 219 226   BMET:  Recent Labs    06/02/24 0326 06/03/24 0334  NA 138 137  K 3.7 3.9  CL 102 105  CO2 23 23  GLUCOSE 92 106*  BUN 13 13  CREATININE 0.85 0.72  CALCIUM 8.4* 8.5*    PT/INR: No results for input(s): LABPROT, INR in the last 72 hours. ABG    Component Value Date/Time   TCO2 31 05/28/2024 1432   CBG (last 3)  No results for input(s): GLUCAP in the last 72 hours.  Assessment/Plan: S/P Procedure(s) (LRB): VIDEO ASSISTED THORACOSCOPY (VATS)/ LOBECTOMY (N/A) BLOCK, NERVE, INTERCOSTAL (Left) - Pain well controlled Path still pending If discharged will see back in office in about a week   LOS: 7 days    Elspeth JAYSON Millers 06/04/2024

## 2024-06-04 NOTE — Assessment & Plan Note (Signed)
 Reflux: Protonix 40 mg daily

## 2024-06-04 NOTE — Plan of Care (Signed)
  Problem: Education: Goal: Knowledge of disease or condition will improve Outcome: Progressing Goal: Knowledge of the prescribed therapeutic regimen will improve Outcome: Progressing   Problem: Activity: Goal: Risk for activity intolerance will decrease Outcome: Progressing   Problem: Cardiac: Goal: Will achieve and/or maintain hemodynamic stability Outcome: Progressing   Problem: Clinical Measurements: Goal: Postoperative complications will be avoided or minimized Outcome: Progressing   Problem: Respiratory: Goal: Respiratory status will improve Outcome: Progressing   Problem: Pain Management: Goal: Pain level will decrease Outcome: Progressing   Problem: Skin Integrity: Goal: Wound healing without signs and symptoms infection will improve Outcome: Progressing

## 2024-06-04 NOTE — Plan of Care (Signed)
 Doing well. Had episode of sneezing out blood clot of his left nostrils twice. None since then.  Scanty dry blood visualized in his nostrils. Otherwise, normal exam.  Daughter requested void trial prior to home d/c. We will remove foley and monitor his urine output closely. Daily progress not to follow.

## 2024-06-04 NOTE — Plan of Care (Signed)
°  Problem: Activity: Goal: Risk for activity intolerance will decrease Outcome: Progressing   Problem: Respiratory: Goal: Respiratory status will improve Outcome: Progressing   Problem: Pain Management: Goal: Pain level will decrease Outcome: Progressing

## 2024-06-04 NOTE — Progress Notes (Signed)
 Daily Progress Note Intern Pager: [phone]  Patient name: David [name] Medical record number: [id number] Date of birth: 1950-12-10 Age: 73 y.o. Gender: male  Primary Care Provider: Health, [address] Consultants: Heme/Onc, CT Surg Code Status: Full  Pt Overview and Major Events to Date:  11/4: Admitted to FMTS 11/6: Thoracoscopy biopsy 11/11: Foley cath removal   Assessment and Plan: David [name] is a 73 y.o. male with a PMH of discitis, hx of penile cancer (2023). Admitted for weakness and aphagia and found to have a new mediastinal mass with hypercalcermia. Underwent biopsy of mass, results pending, will follow o/p with heme/onc. Intermittent tachycardia in the setting of Mag 1.8 and s/p foley removal with ambulation. Will plan to replete Mag with Phos, increase metoprolol  to 100mg ,  and closely monitor electrolytes and Afib prior to discharge.  Assessment & Plan Mediastinal mass S/p biopsy, pending pathology report. Per oncology, d/c home when stable for pathology if not available during admission.  - Heme/Onc following, appreciate recommendations - Surgical biopsy results pending - Changed Tylenol  to PRN, monitor for any fever, if fever present will order CBC, CMP, blood cxs, chest xray - Pain regimen: Tylenol  1000 mg q6h PRN, tramadol 50mg  q6 PRN; nurse consulted to hold Tylenol  unless fever of 100.4 - Zofran  4mg  q6 PRN for N/V - Tolerating oral hydration Hypokalemia  Hypomagnesemia  Hypophosphatemia Refeeding syndrome Severe malnutrition Monitor refeeding lab closely, will discharge once electrolyte stability maintained. Mag 1.8 - 2g Mag IV - Phos 500 mg tablet  - Daily ordering Phos, Mag, BMP  - Thiamine 500 mg daily - RD following, appreciate recommendations - Ensure BID between meals - Multivitamin daily - Remeron 15 mg daily at bedtime Difficulty urinating Urinary retention Foley removed, bladder scan pending. Outpatient urology f/u with [facility]  -  Flomax 0.4 mg daily - Appointment made [facility] for 11/13 Paroxysmal atrial fibrillation (HCC) Intermittent tachycardia s/p removal foley with increased ambulation  - Additional toprol  xl 50 mg, then toprol  100mg  daily for rhythm and rate control - Eliquis 5 mg BID Chronic health problem Reflux: Protonix 40 mg daily    FEN/GI: Regular PPx: Eliquis Dispo:Home with home health pending clinical improvement  of electrolytes   Subjective:  Found sitting up in bed with daughters at bedside. Feels good and is wondering about discharge, explained about electrolytes and wanting stability of those before discharge. Two bowel movements, on last night and one this morning. Foley catheter removed while in room.   Objective: Temp:  [98.2 F (36.8 C)-99 F (37.2 C)] 99 F (37.2 C) (11/11 0835) Pulse Rate:  [65-87] 84 (11/11 0835) Resp:  [18-26] 20 (11/11 0835) BP: (139-158)/(57-71) 149/58 (11/11 0835) SpO2:  [94 %-97 %] 97 % (11/11 0835) Weight:  [81.6 kg] 81.6 kg (11/11 0448)  Physical Exam:  General: Well-appearing, no acute distress Cardio: Increased rate, regular rhythm, no murmurs on exam. Pulm: Clear, no wheezing, no crackles. No increased work of breathing Abdominal: bowel sounds present, soft, non-tender, non-distended Extremities: no peripheral edema   Laboratory: Most recent CBC Lab Results  Component Value Date   WBC 8.1 06/04/2024   HGB 9.3 (L) 06/04/2024   HCT 29.1 (L) 06/04/2024   MCV 87.7 06/04/2024   PLT 200 06/04/2024   Most recent BMP    Latest Ref Rng & Units 06/04/2024    8:02 AM  BMP  Glucose 70 - 99 mg/dL 875   BUN 8 - 23 mg/dL 10   Creatinine 9.38 -  1.24 mg/dL 9.27   Sodium 864 - 854 mmol/L 133   Potassium 3.5 - 5.1 mmol/L 4.5   Chloride 98 - 111 mmol/L 102   CO2 22 - 32 mmol/L 21   Calcium 8.9 - 10.3 mg/dL 8.3    Mag: 1.8 Phos: 2.5  Elodie Palma, MD 06/04/2024, 11:03 AM  PGY-1, [facility] FPTS Intern pager:  [phone], text pages welcome Secure chat group [facility] Teaching Service

## 2024-06-04 NOTE — Progress Notes (Signed)
 PT Cancellation Note  Patient Details Name: David [name] MRN: [id number] DOB: 1951-02-05   Cancelled Treatment:    Reason Eval/Treat Not Completed: (P) Medical issues which prohibited therapy (per RN, pt tachy to 130's-140's bpm resting, she requests PTA return later in the day to work on OOB mobility.) Will continue efforts per PT plan of care as schedule permits.   Connell HERO Tiffiany Beadles 06/04/2024, 9:50 AM

## 2024-06-05 DIAGNOSIS — J9859 Other diseases of mediastinum, not elsewhere classified: Secondary | ICD-10-CM | POA: Diagnosis not present

## 2024-06-05 DIAGNOSIS — E878 Other disorders of electrolyte and fluid balance, not elsewhere classified: Secondary | ICD-10-CM | POA: Diagnosis not present

## 2024-06-05 LAB — BASIC METABOLIC PANEL WITH GFR
Anion gap: 9 (ref 5–15)
BUN: 10 mg/dL (ref 8–23)
CO2: 23 mmol/L (ref 22–32)
Calcium: 8.2 mg/dL — ABNORMAL LOW (ref 8.9–10.3)
Chloride: 104 mmol/L (ref 98–111)
Creatinine, Ser: 0.76 mg/dL (ref 0.61–1.24)
GFR, Estimated: >60 mL/min (ref >60)
Glucose, Bld: 101 mg/dL — ABNORMAL HIGH (ref 70–99)
Potassium: 4.3 mmol/L (ref 3.5–5.1)
Sodium: 136 mmol/L (ref 135–145)

## 2024-06-05 LAB — MAGNESIUM: Magnesium: 1.9 mg/dL (ref 1.7–2.4)

## 2024-06-05 LAB — SURGICAL PATHOLOGY

## 2024-06-05 LAB — PHOSPHORUS: Phosphorus: 3.7 mg/dL (ref 2.5–4.6)

## 2024-06-05 MED ORDER — MAGNESIUM SULFATE IN D5W 1-5 GM/100ML-% IV SOLN
1.0000 g | Freq: Once | INTRAVENOUS | Status: AC
  Administered 2024-06-05: 1 g via INTRAVENOUS
  Filled 2024-06-05: qty 100

## 2024-06-05 NOTE — Assessment & Plan Note (Signed)
 S/p biopsy, pending pathology report. Outpatient f/u with oncology - Heme/Onc following, appreciate recommendations - Changed Tylenol  to PRN, monitor for any fever, if fever present will order CBC, CMP, blood cxs, chest xray - Pain regimen: Tylenol  1000 mg q6h PRN, tramadol 50mg  q6 PRN; nurse consulted to hold Tylenol  unless fever of 100.4 - Zofran  4mg  q6 PRN for N/V - Tolerating oral hydration

## 2024-06-05 NOTE — Assessment & Plan Note (Signed)
 Calcium, Potassium, and Phosphorus normalized, Mag increased slightly from 1.8 to 1.9 - 1g Mag 100 mL @0600  - Daily ordering Phos, Mag, BMP  - Thiamine 500 mg daily - RD following, appreciate recommendations - Ensure BID between meals - Multivitamin daily - Remeron 15 mg daily at bedtime

## 2024-06-05 NOTE — Assessment & Plan Note (Signed)
 Reflux: Protonix 40 mg daily

## 2024-06-05 NOTE — Plan of Care (Signed)
  Problem: Education: Goal: Knowledge of disease or condition will improve Outcome: Progressing   Problem: Activity: Goal: Risk for activity intolerance will decrease Outcome: Progressing   Problem: Skin Integrity: Goal: Wound healing without signs and symptoms infection will improve Outcome: Progressing

## 2024-06-05 NOTE — Progress Notes (Signed)
 Daily Progress Note Intern Pager: [phone]  Patient name: David [name] Medical record number: [id number] Date of birth: 1951-02-26 Age: 73 y.o. Gender: male  Primary Care Provider: Health, [address] Consultants: Heme/Onc, General Surgery, Cardiology  Code Status: Full code, confirmed with patient  Pt Overview and Major Events to Date:  11/4: Admitted to FMTS 11/6: Thoracoscopy biopsy 11/11: Foley cath removal  Assessment and Plan: David [name] is a 73 year old male with a PMHx of cystitis, Hx of penile cancer 2023.  Admitted for weakness and aphasia found to have a mediastinal mass with hypercalcemia.  Underwent biopsy of mass, results pending, will follow-up outpatient with heme/unc.  Multiple episodes of A-fib with RVR, cardiology consulted. Patient will need to be electrolyte repletion free for 48 hours prior to discharge, which will be tomorrow.  Assessment & Plan Mediastinal mass S/p biopsy, pending pathology report. Outpatient f/u with oncology - Heme/Onc following, appreciate recommendations - Changed Tylenol  to PRN, monitor for any fever, if fever present will order CBC, CMP, blood cxs, chest xray - Pain regimen: Tylenol  1000 mg q6h PRN, tramadol 50mg  q6 PRN; nurse consulted to hold Tylenol  unless fever of 100.4 - Zofran  4mg  q6 PRN for N/V - Tolerating oral hydration Hypokalemia  Hypomagnesemia  Hypophosphatemia Refeeding syndrome Severe malnutrition Calcium, Potassium, and Phosphorus normalized, Mag increased slightly from 1.8 to 1.9 - 1g Mag 100 mL @0600  - Daily ordering Phos, Mag, BMP  - Thiamine 500 mg daily - RD following, appreciate recommendations - Ensure BID between meals - Multivitamin daily - Remeron 15 mg daily at bedtime Paroxysmal atrial fibrillation (HCC) Epistaxis A-fib with intermittent RVR. Overnight Lopressor  5mg  IV once > back to NS. Cardiology consulted. No epistaxis today, likely [name] to anticoagulant  - Cardiology consulted, appreciate  recommendations - Toprol  XL 100 mg - Eliquis 5 mg BID - Monitor BP and HR closely - Monitor CBC closely - Monitor bleed closely   Difficulty urinating Urinary retention Outpatient urology f/u with [facility]  - Flomax 0.4 mg daily - Appointment made [facility] for 12/17 - Monitor I's and O's closely  Chronic health problem Reflux: Protonix 40 mg daily   FEN/GI: Regular  PPx: Eliquis Dispo:Home with home health pending clinical improvement . Barriers include electrolyte abnormality.   Subjective:  Sitting up in bed with daughter at bedside. States he has had multiple episodes of BM and urination without difficulty. Still experiencing palpitations and light-headedness intermittently when RVR occurs.   Objective: Temp:  [98.2 F (36.8 C)-99.1 F (37.3 C)] 98.2 F (36.8 C) (11/12 0546) Pulse Rate:  [68-123] 75 (11/12 0546) Resp:  [13-24] 13 (11/12 0546) BP: (130-163)/(58-89) 142/62 (11/12 0546) SpO2:  [94 %-97 %] 95 % (11/12 0546) Weight:  [77.8 kg] 77.8 kg (11/12 0546)  Physical Exam General: Well-appearing, no acute distress Cardio: Increased rate, regular rhythm, no murmurs on exam. Pulm: Clear, no wheezing, no crackles. No increased work of breathing Abdominal: bowel sounds present, soft, non-tender, non-distended Extremities: no peripheral edema   Laboratory: Most recent CBC Lab Results  Component Value Date   WBC 8.1 06/04/2024   HGB 9.3 (L) 06/04/2024   HCT 29.1 (L) 06/04/2024   MCV 87.7 06/04/2024   PLT 200 06/04/2024   Most recent BMP    Latest Ref Rng & Units 06/05/2024    2:11 AM  BMP  Glucose 70 - 99 mg/dL 898   BUN 8 - 23 mg/dL 10   Creatinine 9.38 - 1.24 mg/dL 9.23   Sodium 864 -  145 mmol/L 136   Potassium 3.5 - 5.1 mmol/L 4.3   Chloride 98 - 111 mmol/L 104   CO2 22 - 32 mmol/L 23   Calcium 8.9 - 10.3 mg/dL 8.2    Magnesium: 1.9 Phosphorus: 3.7   Elodie Palma, MD 06/05/2024, 7:08 AM  PGY-1, [facility] Family  Medicine FPTS Intern pager: [phone], text pages welcome Secure chat group [facility] Teaching Service

## 2024-06-05 NOTE — Progress Notes (Signed)
 Mobility Specialist Progress Note;   06/05/24 0940  Mobility  Activity Ambulated with assistance  Level of Assistance Contact guard assist, steadying assist  Assistive Device Front wheel walker  Distance Ambulated (ft) 300 ft  Activity Response Tolerated well  Mobility Referral Yes  Mobility visit 1 Mobility  Mobility Specialist Start Time (ACUTE ONLY) 0941  Mobility Specialist Stop Time (ACUTE ONLY) 0955  Mobility Specialist Time Calculation (min) (ACUTE ONLY) 14 min   Pt eager for mobility. Required MinG assistance during ambulation for safety. VSS throughout. Took 1x seated rest break d/t fatigue, otherwise asx. HR up to 96 bpm w/ exertion. Pt returned back to bed and left with all needs met, alarm on.   Lauraine Erm Mobility Specialist Please contact via SecureChat or [employer] [phone]

## 2024-06-05 NOTE — Assessment & Plan Note (Addendum)
 Outpatient urology f/u with [facility]  - Flomax 0.4 mg daily - Appointment made [facility] for 12/17 - Monitor I's and O's closely

## 2024-06-05 NOTE — Plan of Care (Signed)
  Problem: Education: Goal: Knowledge of disease or condition will improve Outcome: Progressing Goal: Knowledge of the prescribed therapeutic regimen will improve Outcome: Progressing   Problem: Activity: Goal: Risk for activity intolerance will decrease Outcome: Progressing   Problem: Cardiac: Goal: Will achieve and/or maintain hemodynamic stability Outcome: Progressing   Problem: Clinical Measurements: Goal: Postoperative complications will be avoided or minimized Outcome: Progressing   Problem: Respiratory: Goal: Respiratory status will improve Outcome: Progressing   Problem: Pain Management: Goal: Pain level will decrease Outcome: Progressing   Problem: Skin Integrity: Goal: Wound healing without signs and symptoms infection will improve Outcome: Progressing

## 2024-06-05 NOTE — Progress Notes (Signed)
 Physical Therapy Treatment Patient Details Name: David [name] MRN: [id number] DOB: 03/24/1951 Today's Date: 06/05/2024   History of Present Illness 73 y.o. male presenting to ED 10/21 with generalized weakness and worsening aphasia for the last 72 hours. 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 Calcium was found to be 13.1. 11/6 s/p VATS with bx of L mediastinal mass. 11/7 L CT removed in AM. PMH: s/p C5-C6-C7 decompressive laminectomy with evacuation of cervical epidural abscess 9/26, HTN, GERD, colon polyp, and skin cancer.    PT Comments  Pt received in supine with daughter present, pt agreeable to gait and stair trial with SPTA. Pt requires up to minA to stand from surfaces without arm rest, and needed +2 assist (chair follow) to perform gait trial with RW support due to c/o dizziness with standing that waxed and waned. During gait trial, pt HR increased to high 140's bpm, RN notified, HR maintains 130's bpm once pt seated on toilet to rest, he remains symptomatic of dizziness. Seated BP taken and found within normal limits, SpO2 WFL on RA throughout. Pt was informed to pull the call bell in bathroom when he was done, daughter was present, door left cracked and RN was outside of room and notified. Pt continues to benefit from PT services to progress toward functional mobility goals, continue to recommend HHPT upon DC.    If plan is discharge home, recommend the following: A little help with walking and/or transfers;A little help with bathing/dressing/bathroom;Assistance with cooking/housework;Direct supervision/assist for medications management;Direct supervision/assist for financial management;Assist for transportation;Help with stairs or ramp for entrance;Supervision due to cognitive status   Can travel by private vehicle        Equipment Recommendations  None recommended by PT    Recommendations for Other Services       Precautions / Restrictions  Precautions Precautions: Fall Recall of Precautions/Restrictions: Intact Precaution/Restrictions Comments: recent fall day PTA, watch HR Restrictions Weight Bearing Restrictions Per Provider Order: No     Mobility  Bed Mobility Overal bed mobility: Needs Assistance Bed Mobility: Supine to Sit Rolling: Supervision   Supine to sit: Supervision, HOB elevated, Used rails     General bed mobility comments: Pt required increased time to get EOB, able to problem solve to get EOB using rails. Supervision for safety    Transfers Overall transfer level: Needs assistance Equipment used: Rolling walker (2 wheels) Transfers: Sit to/from Stand, Bed to chair/wheelchair/BSC Sit to Stand: +2 safety/equipment, Contact guard assist, Min assist   Step pivot transfers: Min assist       General transfer comment: Pt able to stand from chair with arm rest CGA but requires Min A from soft bed surface without arm rest. Pt c/o dizziness upon standing that got slighty better over time.    Ambulation/Gait Ambulation/Gait assistance: Contact guard assist, +2 safety/equipment Gait Distance (Feet): 130 Feet Assistive device: Rolling walker (2 wheels) Gait Pattern/deviations: Step-through pattern, Decreased dorsiflexion - left, Knee flexed in stance - left, Decreased stride length Gait velocity: decreased Gait velocity interpretation: <1.31 ft/sec, indicative of household ambulator   General Gait Details: Pt ambulated about 98ft before taking a 2-3 minute seated rest break where patient stated he may be dehyrated from lack of water today. Pt then ambulated roughly 62ft more before HR jumped from 80s to 142. Pt took a standing rest break before ambulating the the toilet in room. RN notified and was going to give him a bath and continue to monitor his  vitals. Defered stair trial after HR increased. Chair follow during gait   Stairs         General stair comments: Defered due to HR   Wheelchair  Mobility     Tilt Bed    Modified Rankin (Stroke Patients Only)       Balance Overall balance assessment: Needs assistance Sitting-balance support: Bilateral upper extremity supported, Feet supported Sitting balance-Leahy Scale: Fair Sitting balance - Comments: EOB Postural control: Posterior lean Standing balance support: During functional activity, Bilateral upper extremity supported, Reliant on assistive device for balance Standing balance-Leahy Scale: Poor Standing balance comment: reliant on external support when standing                            Communication Communication Communication: No apparent difficulties  Cognition Arousal: Alert Behavior During Therapy: WFL for tasks assessed/performed   PT - Cognitive impairments: Safety/Judgement                         Following commands: Intact Following commands impaired: Follows multi-step commands inconsistently, Follows multi-step commands with increased time, Follows one step commands with increased time    Cueing Cueing Techniques: Verbal cues  Exercises      General Comments General comments (skin integrity, edema, etc.): Pt HR went from 80s to 142 near the end of gait trial today, defered further ambulation and stairs today. Pt left on commode with daughter in room and RN notified.      Pertinent Vitals/Pain Pain Assessment Pain Assessment: No/denies pain Faces Pain Scale: No hurt Pain Intervention(s): Monitored during session    Home Living                          Prior Function            PT Goals (current goals can now be found in the care plan section) Acute Rehab PT Goals Patient Stated Goal: go home PT Goal Formulation: With patient/family Time For Goal Achievement: 06/12/24 Progress towards PT goals: Progressing toward goals    Frequency    Min 2X/week      PT Plan      Co-evaluation              AM-PAC PT 6 Clicks Mobility   Outcome  Measure  Help needed turning from your back to your side while in a flat bed without using bedrails?: A Little Help needed moving from lying on your back to sitting on the side of a flat bed without using bedrails?: A Little Help needed moving to and from a bed to a chair (including a wheelchair)?: A Little Help needed standing up from a chair using your arms (e.g., wheelchair or bedside chair)?: A Little Help needed to walk in hospital room?: A Little Help needed climbing 3-5 steps with a railing? : A Lot 6 Click Score: 17    End of Session Equipment Utilized During Treatment: Gait belt Activity Tolerance: Patient tolerated treatment well;Treatment limited secondary to medical complications (Comment) (HR 142 at end of gait trial) Patient left: with family/visitor present;Other (comment);with call bell/phone within reach (Patient on commode with wall pull call bell, pt informed 3x to pull when done or if he needed anything, bathroom door left cracked with daughter in room.  RN notified) Nurse Communication: Mobility status;Other (comment) (Pt on commode with wall pull call bell, bathroom door cracked, daughter  in room, HR went to 142 with exertion, and pt ready for a bath) PT Visit Diagnosis: Unsteadiness on feet (R26.81);Muscle weakness (generalized) (M62.81);History of falling (Z91.81);Difficulty in walking, not elsewhere classified (R26.2)     Time: 8767-8744 PT Time Calculation (min) (ACUTE ONLY): 23 min  Charges:    $Gait Training: 8-22 mins $Therapeutic Activity: 8-22 mins                       David [name] JACQUE David [name] David [name] 06/05/2024, 3:26 PM

## 2024-06-05 NOTE — Assessment & Plan Note (Addendum)
 A-fib with intermittent RVR. Overnight Lopressor  5mg  IV once > back to NS. Cardiology consulted. No epistaxis today, likely due to anticoagulant  - Cardiology consulted, appreciate recommendations - Toprol  XL 100 mg - Eliquis 5 mg BID - Monitor BP and HR closely - Monitor CBC closely - Monitor bleed closely

## 2024-06-05 NOTE — Plan of Care (Signed)
°  Problem: Activity: Goal: Risk for activity intolerance will decrease Outcome: Progressing   Problem: Respiratory: Goal: Respiratory status will improve Outcome: Progressing   Problem: Pain Management: Goal: Pain level will decrease Outcome: Progressing

## 2024-06-05 NOTE — Plan of Care (Signed)
 FMTS Interim Progress Note  S: Met with patient and daughter at bedside to discuss biopsy results confirming Classical Hodgkin's Lymphoma. Provided education regarding the diagnosis and discussed the upcoming oncology appointment scheduled for November 20th. Family was understandably emotional; they expressed concerns and asked several questions, which were addressed to the best of my ability. Emotional support provided.  O: BP (!) 129/57 (BP Location: Left Arm)   Pulse 66   Temp 98.6 F (37 C) (Oral)   Resp 20   Ht 6' (1.829 m)   Wt 77.8 kg   SpO2 96%   BMI 23.26 kg/m     A/P: - Continue current plan as outlined in day progress note  Elodie Palma, MD 06/05/2024, 4:23 PM PGY-1, [facility] Family Medicine Service pager [phone]

## 2024-06-06 ENCOUNTER — Other Ambulatory Visit (HOSPITAL_COMMUNITY): Payer: Self-pay

## 2024-06-06 DIAGNOSIS — C8192 Hodgkin lymphoma, unspecified, intrathoracic lymph nodes: Secondary | ICD-10-CM | POA: Diagnosis not present

## 2024-06-06 DIAGNOSIS — D649 Anemia, unspecified: Secondary | ICD-10-CM

## 2024-06-06 DIAGNOSIS — I4891 Unspecified atrial fibrillation: Secondary | ICD-10-CM

## 2024-06-06 DIAGNOSIS — R130 Aphagia: Secondary | ICD-10-CM | POA: Diagnosis not present

## 2024-06-06 LAB — BASIC METABOLIC PANEL WITH GFR
Anion gap: 10 (ref 5–15)
BUN: 10 mg/dL (ref 8–23)
CO2: 21 mmol/L — ABNORMAL LOW (ref 22–32)
Calcium: 8.5 mg/dL — ABNORMAL LOW (ref 8.9–10.3)
Chloride: 105 mmol/L (ref 98–111)
Creatinine, Ser: 0.84 mg/dL (ref 0.61–1.24)
GFR, Estimated: >60 mL/min (ref >60)
Glucose, Bld: 116 mg/dL — ABNORMAL HIGH (ref 70–99)
Potassium: 4.6 mmol/L (ref 3.5–5.1)
Sodium: 136 mmol/L (ref 135–145)

## 2024-06-06 LAB — PHOSPHORUS: Phosphorus: 2.7 mg/dL (ref 2.5–4.6)

## 2024-06-06 LAB — MAGNESIUM: Magnesium: 1.7 mg/dL (ref 1.7–2.4)

## 2024-06-06 MED ORDER — APIXABAN 5 MG PO TABS
5.0000 mg | ORAL_TABLET | Freq: Two times a day (BID) | ORAL | 0 refills | Status: AC
  Filled 2024-06-06: qty 60, 30d supply, fill #0

## 2024-06-06 MED ORDER — POLYETHYLENE GLYCOL 3350 17 GM/SCOOP PO POWD
17.0000 g | Freq: Two times a day (BID) | ORAL | 0 refills | Status: DC
  Filled 2024-06-06: qty 238, 7d supply, fill #0

## 2024-06-06 MED ORDER — METOPROLOL SUCCINATE ER 100 MG PO TB24
100.0000 mg | ORAL_TABLET | Freq: Every day | ORAL | 0 refills | Status: AC
  Filled 2024-06-06: qty 30, 30d supply, fill #0

## 2024-06-06 MED ORDER — PANTOPRAZOLE SODIUM 40 MG PO TBEC
40.0000 mg | DELAYED_RELEASE_TABLET | Freq: Every day | ORAL | 0 refills | Status: AC
  Filled 2024-06-06: qty 30, 30d supply, fill #0

## 2024-06-06 MED ORDER — MIRTAZAPINE 15 MG PO TABS
15.0000 mg | ORAL_TABLET | Freq: Every day | ORAL | 0 refills | Status: DC
  Filled 2024-06-06: qty 30, 30d supply, fill #0

## 2024-06-06 MED ORDER — MAGNESIUM SULFATE 4 GM/100ML IV SOLN
4.0000 g | Freq: Once | INTRAVENOUS | Status: AC
  Administered 2024-06-06: 4 g via INTRAVENOUS
  Filled 2024-06-06: qty 100

## 2024-06-06 MED ORDER — TAMSULOSIN HCL 0.4 MG PO CAPS
0.4000 mg | ORAL_CAPSULE | Freq: Every day | ORAL | 0 refills | Status: DC
  Filled 2024-06-06: qty 30, 30d supply, fill #0

## 2024-06-06 NOTE — Discharge Summary (Signed)
 Family Medicine Teaching [facility] Discharge Summary  Patient name: David [name] Medical record number: [id number] Date of birth: 09-Feb-1951 Age: 73 y.o. Gender: male Date of Admission: 05/28/2024  Date of Discharge: 06/06/2024 Admitting Physician: Damien Pinal, DO  Primary Care Provider: [facility], [facility] Consultants: Heme/Onc, Cardiology, General Surgery  Indication for Hospitalization: Weakness, transient aphagia, hypercalcemia  Discharge Diagnoses/Problem List:  Principal Problem for Admission: Mediastinal Mass Other Problems addressed during stay: PAF, refeeding syndrome/hypokalemia, hypomagnesemia, hypophosphatemia, urinary retention   Brief Hospital Course:  Romaldo Saville is a 73 y.o.male with a history of penile ca, discitis who was admitted to the [facility] Teaching Service at [facility] for Hypercalcemia.  Found to have mediastinal mass during admission.  His hospital course is detailed below:  Hypercalcemia, resolved Mediastinal Mass Presented with new onset aphasia and generalized weakness found to have hypercalcemia with calcium of 13.1 on admission.  Hypercalcemia treated with fluids, calcitonin, zoledronic acid with improvement.  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  Thoracic surgery was consulted, who performed biopsy of mass on 11/6.  Pathology results are pending at time of discharge.  Heme-onc was also consulted, who recommended outpatient follow-up for further management.  Urinary retention Post biopsy, patient experienced urinary retention with failed bladder scan x 3 and Foley was placed on 11/7.  Also started on Flomax 0.4 mg daily.  At family's request given patient with more confusion from baseline per family report, UA was collected 11/8 without strong evidence of infection.  No further antibiotics were  given.  Patient had foley placed on 11/9 which was removed prior to discharge with bladder scan. Patient was discharged with an appt with [facility] for follow up s/p foley removal.   Severe malnutrition with Refeeding Syndrome Patient with decreased appetite and unintentional weight loss.  RD consulted this admission.  Patient supplementing with Ensure between meals, also started on Remeron 15 mg daily at bedtime with improvement in appetite.  Monitored for refeeding, and electrolytes repleted as indicated.   Atrial Fibrillation with RVR Patient experienced multiple episodes of tachycardia causing lightheadedness once he started moving around and eating more. Patient's home metoprolol  was increased from 50 mg to 100 mg with slight improvement. Cardiology was consulted and set up an outpatient appointment for 11/17. At time of discharge, patient was asymptomatic and did not have any sustained episodes of tachycardia.   Other chronic conditions were medically managed with home medications and formulary alternatives as necessary (discitis, PAF, reflux)  PCP Follow-up Recommendations: Ensure follow-up with outpatient oncology Ensure follow-up with outpatient urology  Ensure follow-up with outpatient cardiothoracic surgery Ensure follow-up with cardiology Follow up on electrolytes    Results/Tests Pending at Time of Discharge:  Unresulted Labs (From admission, onward)     Start     Ordered   06/07/24 0500  Basic metabolic panel with GFR  Tomorrow morning,   R       Question:  Specimen collection method  Answer:  Lab=Lab collect   06/06/24 1033   06/07/24 0500  Magnesium  Tomorrow morning,   R       Question:  Specimen collection method  Answer:  Lab=Lab collect   06/06/24 1033   06/07/24 0500  Phosphorus  Tomorrow morning,   R       Question:  Specimen collection method  Answer:  Lab=Lab collect  06/06/24 1033   05/28/24 2200  PTH-related peptide  Once,   R        05/28/24 1857              Disposition: Home  Discharge Condition: Stable  Discharge Exam:  Vitals:   06/06/24 1108 06/06/24 1529  BP: (!) 155/64 127/62  Pulse: 71 74  Resp: 16 17  Temp: 98.5 F (36.9 C) 98.8 F (37.1 C)  SpO2: 97% 93%   Physical Exam per Dr Elodie 06/06/24: General: No acute distress Cardio: Regular rate, regular rhythm, no murmurs on exam. Pulm: Clear, no wheezing, no crackles. No increased work of breathing Abdominal: bowel sounds present, soft, non-tender, non-distended Extremities: no peripheral edema  Neuro: Denies SI, HI  Significant Procedures:  Surgical Pathology: Mediastinal mass, anterior, excision: Classical Hodgkin's and Phoma, nodular sclerosis subtype.  The specimen consists of abundant dense fibrosis/sclerosis with small nodules of lymphoid tissue that contain scattered large atypical cells with irregular nuclear contour and prominent nuclear with Reed-Sternberg type cells.   Significant Labs and Imaging:  No results for input(s): WBC, HGB, HCT, PLT in the last 48 hours. Recent Labs  Lab 06/05/24 0211 06/06/24 1053  NA 136 136  K 4.3 4.6  CL 104 105  CO2 23 21*  GLUCOSE 101* 116*  BUN 10 10  CREATININE 0.76 0.84  CALCIUM 8.2* 8.5*  MG 1.9 1.7  PHOS 3.7 2.7    CT-scan of abdomen and pelvis: Radiology finding -enlarged left periaortic and left external iliac chain lymph nodes most consistent with metastatic disease.   CT-scan of the chest: Radiology finding-large anterior mediastinal mass approximately 5.0 x 8.0 x 11.4 cm partially encasing the proximal right brachiocephalic and left common carotid arteries, with marked narrowing of the left brachiocephalic vein and compression of the SVC.  Enlarged mediastinal lymph nodes including high paratracheal and right hilar nodes.  Subcentimeter thyroid  nodules and a 15 mm peripherally calcified thyroid  isthmus nodule.   Discharge Medications:  Allergies as of 06/06/2024       Reactions    Bee Venom Anaphylaxis, Swelling   Honey Bee Venom Anaphylaxis, Swelling        Medication List     STOP taking these medications    cefTRIAXone  IVPB Commonly known as: ROCEPHIN    daptomycin IVPB Commonly known as: CUBICIN   furosemide 20 MG tablet Commonly known as: LASIX   potassium chloride SA 20 MEQ tablet Commonly known as: KLOR-CON M       TAKE these medications    acetaminophen  500 MG tablet Commonly known as: TYLENOL  Take 1,000 mg by mouth See admin instructions. Take 2 tablets (1000mg ) by mouth every evening (with tramadol) if needed for pain.   Cod Liver Oil Caps Take 1 capsule by mouth every evening.   COQ10 PO Take 1 capsule by mouth daily.   Eliquis 5 MG Tabs tablet Generic drug: apixaban Take 1 tablet (5 mg total) by mouth 2 (two) times daily.   fluorouracil 5 % cream Commonly known as: EFUDEX Apply 1 application  topically 2 (two) times daily as needed (skin spots).   GARLIC PO Take 1 tablet by mouth every evening.   GINGER ROOT PO Take 1 tablet by mouth 2 (two) times daily.   L-GLUTAMINE PO Take 1 tablet by mouth 2 (two) times daily.   Mens 50+ Multivitamin Tabs Take 1 tablet by mouth daily.   metoprolol  succinate 100 MG 24 hr tablet Commonly known as: TOPROL -XL Take 1 tablet (100  mg total) by mouth daily. Take with or immediately following a meal. What changed:  medication strength how much to take when to take this additional instructions   mirtazapine 15 MG tablet Commonly known as: REMERON Take 1 tablet (15 mg total) by mouth at bedtime.   pantoprazole 40 MG tablet Commonly known as: PROTONIX Take 1 tablet (40 mg total) by mouth daily.   polyethylene glycol powder 17 GM/SCOOP powder Commonly known as: GLYCOLAX/MIRALAX Take 17 g by mouth 2 (two) times daily. Dissolve 1 capful (17g) in 4-8 ounces of liquid and take by mouth daily.   PROBIOTIC PO Take 1 capsule by mouth daily.   sennosides-docusate sodium 8.6-50 MG  tablet Commonly known as: SENOKOT-S Take 1 tablet by mouth every evening.   tamsulosin 0.4 MG Caps capsule Commonly known as: FLOMAX Take 1 capsule (0.4 mg total) by mouth daily.   tiZANidine 4 MG capsule Commonly known as: ZANAFLEX Take 4 mg by mouth See admin instructions. Take 1 tablet (4mg ) by mouth every evening if needed for pain, muscle spasms.   traMADol 50 MG tablet Commonly known as: ULTRAM Take 50 mg by mouth See admin instructions. Take 1 tablet (50mg ) by mouth every evening if needed for pain.   VITAMIN B-12 PO Take 1 tablet by mouth daily.   VITAMIN D-3 PO Take 1 capsule by mouth daily.        Discharge Instructions: Please refer to Patient Instructions section of EMR for full details.  Patient was counseled important signs and symptoms that should prompt return to medical care, changes in medications, dietary instructions, activity restrictions, and follow up appointments.   Follow-Up Appointments:  Follow-up Information     Rutha Manuelita HERO, PA-C. Go on 06/11/2024.   Specialties: Physician Assistant, Thoracic Surgery Why: Please arrive by 1:30 pm in order to have a PA/LAT CXR taken PRIOR to office appointment with Manuelita Rutha. CXR located in the same building on the SECOND floor. Appointment time is at 2:30 pm Contact information: [address] [place] [address] [phone]         [facility]) Follow up.   Specialty: Home Health Services Why: home health has been arranged. They will contact you to schedule apt within 48hrt post discharge Contact information: [address] [place]  [address] [phone]                Elodie Palma, MD 06/06/2024, 11:37 AM PGY-1, [facility]   I ave verified that the service and findings are accurately documented in the resident's note above.  Damien Cassis, MD                  06/06/2024, 4:19 PM

## 2024-06-06 NOTE — Assessment & Plan Note (Addendum)
 A-fib with intermittent RVR. Cardiology consulted, will f/u outpatient 11/17.  No epistaxis today, likely due to anticoagulant  - Cardiology consulted, appreciate recommendations - Toprol  XL 100 mg - Eliquis 5 mg BID - Monitor BP and HR closely - Monitor CBC closely - Monitor bleed closely

## 2024-06-06 NOTE — TOC Transition Note (Signed)
 Transition of Care [facility]) - Discharge Note   Patient Details  Name: David [name] MRN: [id number] Date of Birth: [birth year]  [facility] CM/SW Contact:  [name], RN Phone Number: 06/06/2024, 2:42 PM   Clinical Narrative:    [name] is stable to discharge home. Follow up apt on AVS. Notified Kelly with [facility] of discharge.   Final next level of care: Home w Home Health Services Barriers to Discharge: Barriers Resolved   Patient Goals and CMS Choice Patient states their goals for this hospitalization and ongoing recovery are:: return home          Discharge Placement                   home    Discharge Plan and Services Additional resources added to the After Visit Summary for                            [facility] Arranged: PT, OT, Speech Therapy HH Agency: [facility] Date [facility] Agency Contacted: 06/06/24 Time HH Agency Contacted: 1441 Representative spoke with at [facility] Agency: [place]  Social Drivers of Health (SDOH) Interventions SDOH Screenings   Food Insecurity: No Food Insecurity (05/28/2024)  Housing: Low Risk  (05/28/2024)  Transportation Needs: No Transportation Needs (05/28/2024)  Utilities: Not At Risk (05/28/2024)  Social Connections: Moderately Integrated (05/28/2024)  Tobacco Use: Low Risk  (05/30/2024)     Readmission Risk Interventions    06/06/2024    2:42 PM 05/29/2024    3:49 PM  Readmission Risk Prevention Plan  Post Dischage Appt  Complete  Medication Screening  Complete  Transportation Screening Complete Complete  PCP or Specialist Appt within 3-5 Days Complete   HRI or Home Care Consult Complete   Social Work Consult for Recovery Care Planning/Counseling Complete   Palliative Care Screening Not Applicable   Medication Review Ocean[employer]) Complete

## 2024-06-06 NOTE — Assessment & Plan Note (Signed)
 Calcium, Potassium, and Phosphorus normalized, pending morning Mag levels. - Daily ordering Phos, Mag, BMP  - Thiamine 500 mg daily - RD following, appreciate recommendations - Ensure BID between meals - Multivitamin daily - Remeron 15 mg daily at bedtime

## 2024-06-06 NOTE — Progress Notes (Signed)
 David [name]   DOB:06-26-1951   FM#:[id number]      ASSESSMENT & PLAN:  David [name] 73 y.o. male admitted on 05/28/2024 with complaints of symptomatic hypercalcemia and anterior mediastinal mass with SVC.  Pathology confirmed classical Hodgkin's lymphoma.  Medical oncology following closely.  Anterior Mediastinal Mass with abdominal and mediastinal lymphadenopathy SVC involvement -- CT chest done 11/4 showed large anterior mediastinal mass 5.0x8.0x11.4 cm with concern for SVC involvement, enlarged mediastinal lymph nodes.  -- CT abdomen/pelvis done 11/4 showed enlarged left periaortic and left external iliac chain lymph nodes consistent with metastatic disease.  -- MR brain done 11/4 was unremarkable.  -- Cardio thoracic evaluation done.  Pathology done 05/30/2024 confirmed classical Hodgkin's lymphoma.  Discussed results with patient and his daughter today at bedside.  They are agreeable to follow-up with outpatient oncology after discharge. -- Medical Oncology/Dr. Onesimo will see patient in outpatient oncology on 06/13/2024 at [facility].   Hypercalcemia -- Calcium elevated 13.1 on admission 11/4.  Shows response to therapy, 8.5 today.  -- Status post Zometa 4 mg x1.   -- Status post Calcitonin. -- Continue IVF -- Continue to monitor CMP closely   Anemia, normocytic -- Hemoglobin 9.3.  HGB 11.2 on admission. -- No transfusional intervention warranted at this time.  -- Continue to monitor CBC with differential   Aphasia AMS -- Admitted 11/4 with complaints of aphasia and AMS -- MR brain unremarkable -- Show significant improvements.  Answers questions appropriately.   -- Continue supportive care   Discitis, cervical -- Status post surgery on 04/19/24 -- On IV antibiotics   History of SCC of Penis -- Diagnosed 08/2021 and treated.    Afib -- Status post IV heparin.  Now on Eliquis 5 mg p.o. twice daily, tolerating well. -- Monitor closely for bleeding -- Continue close follow with Cardio     Code Status Full   Subjective:  Patient seen awake and alert ambulating in room with assist and with walker.  Reports that he feels much better.  Discussed Hodgkin's diagnosis with patient and patient's daughter who is at bedside.  Objective:   Intake/Output Summary (Last 24 hours) at 06/06/2024 1337 Last data filed at 06/05/2024 2100 Gross per 24 hour  Intake 240 ml  Output --  Net 240 ml     PHYSICAL EXAMINATION: ECOG PERFORMANCE STATUS: 3 - Symptomatic, >50% confined to bed  Vitals:   06/06/24 0911 06/06/24 1108  BP: (!) 144/71 (!) 155/64  Pulse: 85 71  Resp:  16  Temp:  98.5 F (36.9 C)  SpO2:  97%   Filed Weights   06/04/24 0448 06/05/24 0546 06/06/24 0333  Weight: 179 lb 14.4 oz (81.6 kg) 171 lb 8.3 oz (77.8 kg) 179 lb 7.3 oz (81.4 kg)    GENERAL: alert, no distress and comfortable +ambulates with walker SKIN: skin color, texture, turgor are normal, no rashes or significant lesions EYES: normal, conjunctiva are pink and non-injected, sclera clear OROPHARYNX: no exudate, no erythema and lips, buccal mucosa, and tongue normal  NECK: supple, thyroid  normal size, non-tender, without nodularity LYMPH: no palpable lymphadenopathy in the cervical, axillary or inguinal LUNGS: clear to auscultation and percussion with normal breathing effort HEART: regular rate & rhythm and no murmurs and no lower extremity edema ABDOMEN: abdomen soft, non-tender and normal bowel sounds MUSCULOSKELETAL: no cyanosis of digits and no clubbing  PSYCH: alert & oriented x 3 with fluent speech NEURO: no focal motor/sensory deficits   All questions were answered. The patient knows to  call the clinic with any problems, questions or concerns.   The total time spent in the appointment was 40 minutes encounter with patient including review of chart and various tests results, discussions about plan of care and coordination of care plan  Olam JINNY Brunner, NP 06/06/2024 1:37 PM    Labs  Reviewed:  Lab Results  Component Value Date   WBC 8.1 06/04/2024   HGB 9.3 (L) 06/04/2024   HCT 29.1 (L) 06/04/2024   MCV 87.7 06/04/2024   PLT 200 06/04/2024   Recent Labs    06/01/24 0314 06/01/24 1817 06/02/24 0326 06/03/24 0334 06/04/24 0802 06/05/24 0211 06/06/24 1053  NA 138   < > 138 137 133* 136 136  K 3.3*   < > 3.7 3.9 4.5 4.3 4.6  CL 102   < > 102 105 102 104 105  CO2 22   < > 23 23 21* 23 21*  GLUCOSE 91   < > 92 106* 124* 101* 116*  BUN 17   < > 13 13 10 10 10   CREATININE 0.87   < > 0.85 0.72 0.72 0.76 0.84  CALCIUM 8.7*   < > 8.4* 8.5* 8.3* 8.2* 8.5*  GFRNONAA >60   < > >60 >60 >60 >60 >60  PROT 6.0*  --  5.7* 5.8*  --   --   --   ALBUMIN 2.5*  --  2.4* 2.3*  --   --   --   AST 14*  --  13* 12*  --   --   --   ALT 10  --  10 12  --   --   --   ALKPHOS 110  --  110 114  --   --   --   BILITOT 0.6  --  0.5 0.3  --   --   --    < > = values in this interval not displayed.    Studies Reviewed:  DG Chest Port 1 View Result Date: 06/01/2024 CLINICAL DATA:  Pneumothorax. EXAM: PORTABLE CHEST 1 VIEW COMPARISON:  Radiograph yesterday. FINDINGS: Small left pneumothorax is slightly decreased in size from yesterday's exam, just above the posterior third rib. The right upper extremity PICC is been removed. Background hyperinflation and coarse lung markings are unchanged. Stable heart size and mediastinal contours. Persistent blunting of both costophrenic angles. Remote right rib fractures. IMPRESSION: 1. Small left pneumothorax is slightly decreased in size from yesterday's exam. 2. Interval removal of right upper extremity PICC. Electronically Signed   By: Andrea Gasman M.D.   On: 06/01/2024 13:04   DG Chest 2 View Result Date: 05/31/2024 EXAM: 2 VIEW(S) XRAY OF THE CHEST 05/31/2024 11:58:00 AM COMPARISON: 05/31/2024 CLINICAL HISTORY: Pneumothorax FINDINGS: LINES, TUBES AND DEVICES: Right upper extremity PICC in place with tip overlying the superior cavoatrial junction.  Left chest drain removed. LUNGS AND PLEURA: Small left apical pneumothorax, visceral pleural line 1.4 cm from chest wall. Small bilateral pleural effusions. Hyperinflation and interstitial coarsening are nonspecific in this never smoker. No focal pulmonary opacity. No pulmonary edema. HEART AND MEDIASTINUM: No acute abnormality of the cardiac and mediastinal silhouettes. BONES AND SOFT TISSUES: No acute osseous abnormality. IMPRESSION: 1. 5 percent left apical pneumothorax. 2. Small bilateral pleural effusions. Electronically signed by: Rockey Kilts MD 05/31/2024 05:15 PM EST RP Workstation: [id number]   DG Chest Port 1 View Result Date: 05/31/2024 EXAM: 1 VIEW(S) XRAY OF THE CHEST 05/31/2024 06:20:00 AM COMPARISON: 05/30/2024 CLINICAL HISTORY: Pneumothorax FINDINGS: LINES, TUBES  AND DEVICES: Left chest drain in place. Right upper extremity PICC in place with tip at superior cavoatrial junction. LUNGS AND PLEURA: No focal pulmonary opacity. No pulmonary edema. No pleural effusion. No significant pneumothorax. HEART AND MEDIASTINUM: No acute abnormality of the cardiac and mediastinal silhouettes. BONES AND SOFT TISSUES: Chronic right rib fracture deformities. IMPRESSION: 1. No significant pneumothorax. 2. Left chest drain in place. Electronically signed by: Katheleen Faes MD 05/31/2024 11:00 AM EST RP Workstation: [id number]   DG Chest Port 1 View Result Date: 05/30/2024 CLINICAL DATA:  Pneumothorax. EXAM: PORTABLE CHEST 1 VIEW COMPARISON:  Chest radiograph dated 05/15/2024. FINDINGS: Right-sided PICC with tip at the cavoatrial junction. No focal consolidation or pleural effusion, pneumothorax. The cardiac silhouette is within limits. Widening of the mediastinum in keeping with known mass. No acute osseous pathology. IMPRESSION: Right-sided PICC with tip at the cavoatrial junction. No pneumothorax. Electronically Signed   By: Vanetta Chou M.D.   On: 05/30/2024 19:25   CT CHEST W CONTRAST Result Date:  05/28/2024 EXAM: CT CHEST WITH CONTRAST 05/28/2024 09:58:07 PM TECHNIQUE: CT of the chest was performed with the administration of 75 mL of iohexol (OMNIPAQUE) 350 MG/ML injection. Multiplanar reformatted images are provided for review. Automated exposure control, iterative reconstruction, and/or weight based adjustment of the mA/kV was utilized to reduce the radiation dose to as low as reasonably achievable. COMPARISON: None available. CLINICAL HISTORY: Mediastinal mass. FINDINGS: MEDIASTINUM: Heart and pericardium are unremarkable. The central airways are clear. There is an anterior mediastinal mass from the level of the origin of the great vessels extending to the level of the aortic valve. This measures approximately 5.0 x 8.0 x 11.4 cm. This partially encompasses the proximal right brachiocephalic and left common carotid arteries. There is marked narrowing of the distal left brachiocephalic vein and there is also compression of the superior vena cava. Right-sided central venous catheter tip is in the distal SVC. Atherosclerotic calcifications of the aorta and iliac arteries. There is a small hiatal hernia. Appears soft tissue mass extending between the IVC and right greater saphenous artery measuring 3.5 x 2.6 cm. There are subcentimeter hypodense thyroid  nodules. There is a peripherally calcified nodule in the isthmus of the thyroid  measuring 15 mm. LYMPH NODES: Enlarged high right paratracheal lymph nodes measuring up to 10 mm short axis. Large right hilar lymph nodes measure up to 10 mm short axis. No axillary lymphadenopathy. LUNGS AND PLEURA: There is a small amount of atelectasis or scarring in both lung bases and the lingula. The lungs are otherwise clear. No pleural effusion or pneumothorax. SOFT TISSUES/BONES: Revealed right fourth and fifth rib fractures. Degenerative changes of the spine. There is some sclerosis in the lateral left fifth rib, indeterminate. No acute abnormality of the soft tissues.  UPPER ABDOMEN: Limited images of the upper abdomen demonstrates gallstones are present. No other acute abnormality. IMPRESSION: 1. 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 Recommend urgent multidisciplinary evaluation (oncology/thoracic surgery) and tissue diagnosis. 2. Enlarged mediastinal lymph nodes, including high paratracheal and right hilar nodes. 3. Subcentimeter thyroid  nodules and a 15 mm peripherally calcified thyroid  isthmus nodule. Given presumed age 49, recommend non-emergent thyroid  ultrasound due to 1.5 cm nodule. 4. Indeterminate sclerosis at the lateral left fifth rib. Electronically signed by: Greig Pique MD 05/28/2024 10:14 PM EST  RP Workstation: [id number]   CT ABDOMEN PELVIS W CONTRAST Result Date: 05/28/2024 EXAM: CT ABDOMEN AND PELVIS WITH CONTRAST 05/28/2024 07:56:00 PM TECHNIQUE: CT of the abdomen and pelvis was performed with the administration of 75 mL of iohexol (OMNIPAQUE) 350 MG/ML injection. Multiplanar reformatted images are provided for review. Automated exposure control, iterative reconstruction, and/or weight-based adjustment of the mA/kV was utilized to reduce the radiation dose to as low as reasonably achievable. COMPARISON: None available. CLINICAL HISTORY: Metastatic disease evaluation. FINDINGS: LOWER CHEST: No acute abnormality. LIVER: The liver is unremarkable. GALLBLADDER AND BILE DUCTS: Gallbladder is unremarkable. No biliary ductal dilatation. SPLEEN: No acute abnormality. PANCREAS: No acute abnormality. ADRENAL GLANDS: No acute abnormality. KIDNEYS, URETERS AND BLADDER: There is a finding near the fundus measuring 2.5 cm. There is trabeculated bladder wall. No stones in the kidneys or ureters. No hydronephrosis. No  perinephric or periureteral stranding. GI AND BOWEL: Appendix is not visualized. Stomach demonstrates no acute abnormality. There is no bowel obstruction. PERITONEUM AND RETROPERITONEUM: There is presacral edema. No ascites. No free air. VASCULATURE: Aorta is normal in caliber. LYMPH NODES: There are enlarged left periaortic lymph nodes measuring up to 15 mm short axis. There are enlarged left external iliac chain lymph nodes measuring up to 17 mm short axis. REPRODUCTIVE ORGANS: Prostate gland is prominent in size. BONES AND SOFT TISSUES: Degenerative changes affecting spine. No acute osseous abnormality. No focal soft tissue abnormality. IMPRESSION: 1. Enlarged left periaortic and left external iliac chain lymph nodes most consistent with metastatic disease. 2. Trabeculated bladder. 3. Prostatomegaly. 4. Presacral edema. Electronically signed by: Greig Pique MD 05/28/2024 08:10 PM EST RP Workstation: [id number]   MR BRAIN WO CONTRAST Result Date: 05/28/2024 EXAM: MRI BRAIN WITHOUT CONTRAST 05/28/2024 04:33:54 PM TECHNIQUE: Multiplanar multisequence MRI of the head/brain was performed without the administration of intravenous contrast. COMPARISON: CTA head and neck 05/28/2024. CLINICAL HISTORY: Neuro deficit, acute, stroke suspected. Generalized weakness, aphasia, and headache. FINDINGS: BRAIN AND VENTRICLES: There is no evidence of an acute infarct, mass, midline shift, hydrocephalus, or extra-axial fluid collection. There is mild cerebral atrophy. A 7 mm focus of susceptibility in the left parietal lobe may reflect a chronic hemorrhage or small cavernoma. A punctate chronic microhemorrhage is noted in the right occipital lobe. Small T2 hyperintensities in the cerebral white matter bilaterally are nonspecific but compatible with minimal chronic small vessel ischemic disease, not atypical for age. Major intracranial vascular flow voids are preserved. ORBITS: No acute abnormality. SINUSES AND MASTOIDS: No acute  abnormality. BONES AND SOFT TISSUES: Normal marrow signal. No acute soft tissue abnormality. IMPRESSION: 1. Largely unremarkable appearance of the brain for age. No acute intracranial abnormality. Electronically signed by: Dasie Hamburg MD 05/28/2024 04:50 PM EST RP Workstation: [id number]   CT ANGIO HEAD NECK W WO CM Result Date: 05/28/2024 CLINICAL DATA:  Neuro deficit, acute stroke suspected EXAM: CT ANGIOGRAPHY HEAD AND NECK WITH AND WITHOUT CONTRAST TECHNIQUE: Multidetector CT imaging of the head and neck was performed using the standard protocol during bolus administration of intravenous contrast. Multiplanar CT image reconstructions and MIPs were obtained to evaluate the vascular anatomy. Carotid stenosis measurements (when applicable) are obtained utilizing NASCET criteria, using the distal internal carotid diameter as the denominator. RADIATION DOSE REDUCTION: This exam was performed according to the departmental dose-optimization program which includes automated exposure control, adjustment of the mA and/or kV according to patient size and/or use of iterative reconstruction technique. CONTRAST:  75mL OMNIPAQUE IOHEXOL 350 MG/ML SOLN COMPARISON:  None Available. FINDINGS: CT HEAD:  Attenuation in the brain parenchyma is normal. There is no hemorrhage. No acute ischemic changes. No mass lesion. The ventricles are normal. Skull/sinuses/orbits: No significant abnormality. CTA NECK: CTA NECK Aortic arch: No proximal vessel stenosis. Right carotid: Normal Left carotid: Normal Right vertebral: Normal Left vertebral: Normal Soft tissues: No significant abnormality Other comments: There is a right arm PICC line in place. 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 The A1 segment of the anterior cerebral artery is absent. The middle cerebral artery  is normal. Left anterior circulation: The internal carotid artery and middle cerebral arteries are normal. There is an azygos anterior cerebral artery. Posterior circulation: Both vertebral arteries are patent. There is no significant basilar stenosis. Both posterior cerebral arteries are patent without significant stenosis or proximal branch occlusion. No aneurysm. IMPRESSION: 1. No carotid artery stenosis 2. No significant vertebrobasilar or intracranial disease 3. 4.7 x 9.4 x 11 cm anterior mediastinal mass Electronically Signed   By: Nancyann Burns M.D.   On: 05/28/2024 15:57   DG Chest Portable 1 View Result Date: 05/15/2024 CLINICAL DATA:  AFib. EXAM: PORTABLE CHEST 1 VIEW COMPARISON:  05/14/2024 FINDINGS: Right-sided PICC line unchanged. Lungs are adequately inflated. There is no acute airspace process or effusion. Cardiomediastinal silhouette and remainder of the exam is unchanged for IMPRESSION: No acute cardiopulmonary disease. Electronically Signed   By: Toribio Agreste M.D.   On: 05/15/2024 12:32   DG Chest Portable 1 View Result Date: 05/14/2024 CLINICAL DATA:  Bilateral ankle swelling, recent surgery, r/o effusion EXAM: PORTABLE CHEST - 1 VIEW COMPARISON:  08/30/2017 FINDINGS: Right PICC terminating in the mid SVC. Radiopaque metallic structure overlying the left upper lung zone, likely external to the patient. No focal airspace consolidation, pleural effusion, or pneumothorax. Mild cardiomegaly. Tortuous aorta with aortic atherosclerosis. Multilevel thoracic osteophytosis. IMPRESSION: 1. Mild cardiomegaly. No pneumonia or pulmonary edema. 2. Right PICC terminates in the mid SVC. Electronically Signed   By: Rogelia Myers M.D.   On: 05/14/2024 18:36

## 2024-06-06 NOTE — Assessment & Plan Note (Signed)
 Biopsy results came back yesterday as Classical Hodgkin's Lymphoma, Onc. Made an appt for 20th and will f/u outpatient  - Heme/Onc following, appreciate recommendations - Changed Tylenol  to PRN, monitor for any fever, if fever present will order CBC, CMP, blood cxs, chest xray - Pain regimen: Tylenol  1000 mg q6h PRN, tramadol 50mg  q6 PRN; nurse consulted to hold Tylenol  unless fever of 100.4 - Zofran  4mg  q6 PRN for N/V - Tolerating oral hydration

## 2024-06-06 NOTE — Progress Notes (Signed)
 Occupational Therapy Treatment Patient Details Name: David [name] MRN: [id number] DOB: 1951/05/23 Today's Date: 06/06/2024   History of present illness 73 y.o. male presenting to ED 10/21 with generalized weakness and worsening aphasia for the last 72 hours. 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 Calcium was found to be 13.1. 11/6 s/p VATS with bx of L mediastinal mass. 11/7 L CT removed in AM. PMH: s/p C5-C6-C7 decompressive laminectomy with evacuation of cervical epidural abscess 9/26, HTN, GERD, colon polyp, and skin cancer.   OT comments  Patient demonstrating good gains with bed mobility, transfers, and standing at sink. Patient able to reach feet with figure 4 to attend to LB dressing and able to stand at sink with supervision for grooming and UB bathing tasks. Discharge recommendations continue to be appropriate for d/c home with [facility] and family to assist.        If plan is discharge home, recommend the following:  Assistance with cooking/housework;Direct supervision/assist for medications management;Direct supervision/assist for financial management;Assist for transportation;Help with stairs or ramp for entrance;A little help with walking and/or transfers;A little help with bathing/dressing/bathroom   Equipment Recommendations  BSC/3in1    Recommendations for Other Services      Precautions / Restrictions Precautions Precautions: Fall Recall of Precautions/Restrictions: Intact Precaution/Restrictions Comments: recent fall day PTA Restrictions Weight Bearing Restrictions Per Provider Order: No       Mobility Bed Mobility Overal bed mobility: Needs Assistance Bed Mobility: Supine to Sit, Sit to Supine     Supine to sit: Supervision, HOB elevated, Used rails Sit to supine: Supervision, HOB elevated   General bed mobility comments: increased time and rail use to get to EOB    Transfers Overall transfer level: Needs  assistance Equipment used: Rolling walker (2 wheels) Transfers: Sit to/from Stand, Bed to chair/wheelchair/BSC Sit to Stand: Contact guard assist, From elevated surface           General transfer comment: CGA to stand from raised bed and from chair with cues for hand placement     Balance Overall balance assessment: Needs assistance Sitting-balance support: Bilateral upper extremity supported, Feet supported Sitting balance-Leahy Scale: Fair Sitting balance - Comments: EOB   Standing balance support: Single extremity supported, Bilateral upper extremity supported, During functional activity Standing balance-Leahy Scale: Poor Standing balance comment: reliant on external support when standing                           ADL either performed or assessed with clinical judgement   ADL Overall ADL's : Needs assistance/impaired     Grooming: Wash/dry hands;Wash/dry face;Oral care;Supervision/safety;Standing Grooming Details (indicate cue type and reason): at sink without seated rest break     Lower Body Bathing: Supervison/ safety;Sitting/lateral leans Lower Body Bathing Details (indicate cue type and reason): patient able to bathe feet and apply lotion seated on EOB Upper Body Dressing : Set up;Standing Upper Body Dressing Details (indicate cue type and reason): gown for back Lower Body Dressing: Supervision/safety;Sitting/lateral leans Lower Body Dressing Details (indicate cue type and reason): to change socks             Functional mobility during ADLs: Contact guard assist;Rolling walker (2 wheels) General ADL Comments: gains with self care    Extremity/Trunk Assessment              Vision       Perception     Praxis  Communication Communication Communication: No apparent difficulties   Cognition Arousal: Alert Behavior During Therapy: WFL for tasks assessed/performed Cognition: No apparent impairments                                Following commands: Intact Following commands impaired: Follows multi-step commands inconsistently, Follows multi-step commands with increased time, Follows one step commands with increased time      Cueing   Cueing Techniques: Verbal cues  Exercises      Shoulder Instructions       General Comments      Pertinent Vitals/ Pain       Pain Assessment Pain Assessment: Faces Faces Pain Scale: Hurts a little bit Pain Location: left flank/ribs Pain Descriptors / Indicators: Grimacing, Guarding Pain Intervention(s): Monitored during session, Repositioned  Home Living                                          Prior Functioning/Environment              Frequency  Min 2X/week        Progress Toward Goals  OT Goals(current goals can now be found in the care plan section)  Progress towards OT goals: Progressing toward goals  Acute Rehab OT Goals Patient Stated Goal: to go home OT Goal Formulation: With patient/family Time For Goal Achievement: 06/12/24 Potential to Achieve Goals: Good ADL Goals Pt Will Perform Grooming: with contact guard assist;standing Pt Will Perform Upper Body Bathing: with supervision;sitting Pt Will Perform Lower Body Bathing: with contact guard assist;sitting/lateral leans;sit to/from stand Pt Will Perform Lower Body Dressing: with contact guard assist;sitting/lateral leans;sit to/from stand Pt Will Transfer to Toilet: with min assist;ambulating;bedside commode (with least restrictive AD) Pt Will Perform Toileting - Clothing Manipulation and hygiene: with contact guard assist;sit to/from stand  Plan      Co-evaluation                 AM-PAC OT 6 Clicks Daily Activity     Outcome Measure   Help from another person eating meals?: A Little Help from another person taking care of personal grooming?: A Little Help from another person toileting, which includes using toliet, bedpan, or urinal?: A Little Help  from another person bathing (including washing, rinsing, drying)?: A Little Help from another person to put on and taking off regular upper body clothing?: A Little Help from another person to put on and taking off regular lower body clothing?: A Little 6 Click Score: 18    End of Session Equipment Utilized During Treatment: Gait belt;Rolling walker (2 wheels)  OT Visit Diagnosis: Muscle weakness (generalized) (M62.81);Other symptoms and signs involving cognitive function   Activity Tolerance Patient tolerated treatment well   Patient Left in bed;with call bell/phone within reach;with family/visitor present   Nurse Communication Mobility status        Time: 9162-9094 OT Time Calculation (min): 28 min  Charges: OT General Charges $OT Visit: 1 Visit OT Treatments $Self Care/Home Management : 23-37 mins  Dick Laine, OTA Acute Rehabilitation Services  Office [phone]   Jeb LITTIE Laine 06/06/2024, 12:37 PM

## 2024-06-06 NOTE — Assessment & Plan Note (Signed)
 Outpatient urology f/u with [facility]  - Flomax 0.4 mg daily - Appointment made [facility] for 12/17 - Monitor I's and O's closely

## 2024-06-06 NOTE — Assessment & Plan Note (Signed)
 Reflux: Protonix 40 mg daily

## 2024-06-06 NOTE — Progress Notes (Signed)
 Daily Progress Note Intern Pager: [phone]  Patient name: David [name] Medical record number: [id number] Date of birth: 09-26-1950 Age: 73 y.o. Gender: male  Primary Care Provider: [facility] [address] Consultants: Heme/Onc, General Surgery, Cardiology Code Status: Full code, confirmed with patient  Pt Overview and Major Events to Date:  11/4: Admitted to FMTS 11/6: Thoracoscopy biopsy 11/12: Biopsy results: Classical Hodgkin's Lymphoma    Assessment and Plan: David [name] is a 73 year old male with a past medical history of cystitis, history of penile cancer 2023.  Admitted for weakness and patient found to have mediastinal mass with hypercalcemia.  Underwent biopsy mass and results came back yesterday for classical Hodgkin's Lymphoma, sclerosis subtype.  Oncology will follow-up outpatient on 11/20.  Multiple episodes of A-fib with RVR, cardiology consulted and will see outpatient on 11/17.  Assessment & Plan Mediastinal mass Biopsy results came back yesterday as Classical Hodgkin's Lymphoma, Onc. Made an appt for 20th and will f/u outpatient  - Heme/Onc following, appreciate recommendations - Changed Tylenol  to PRN, monitor for any fever, if fever present will order CBC, CMP, blood cxs, chest xray - Pain regimen: Tylenol  1000 mg q6h PRN, tramadol 50mg  q6 PRN; nurse consulted to hold Tylenol  unless fever of 100.4 - Zofran  4mg  q6 PRN for N/V - Tolerating oral hydration Hypokalemia  Hypomagnesemia  Hypophosphatemia Refeeding syndrome Severe malnutrition Calcium, Potassium, and Phosphorus normalized, pending morning Mag levels. - Daily ordering Phos, Mag, BMP  - Thiamine 500 mg daily - RD following, appreciate recommendations - Ensure BID between meals - Multivitamin daily - Remeron 15 mg daily at bedtime Paroxysmal atrial fibrillation (HCC) Epistaxis A-fib with intermittent RVR. Cardiology consulted, will f/u outpatient 11/17.  No epistaxis today, likely due to anticoagulant   - Cardiology consulted, appreciate recommendations - Toprol  XL 100 mg - Eliquis 5 mg BID - Monitor BP and HR closely - Monitor CBC closely - Monitor bleed closely   Difficulty urinating Urinary retention Outpatient urology f/u with [facility]  - Flomax 0.4 mg daily - Appointment made [facility] for 12/17 - Monitor I's and O's closely  Chronic health problem Reflux: Protonix 40 mg daily   FEN/GI: Regular PPx: Eliquis Dispo:Home today. Barriers include electrolyte levels.   Subjective:  Sitting up with daughter at bedside. Both are asking about discharge. Pa  Objective: Temp:  [98.1 F (36.7 C)-99.2 F (37.3 C)] 98.5 F (36.9 C) (11/13 0715) Pulse Rate:  [66-84] 84 (11/13 0715) Resp:  [19-20] 19 (11/13 0715) BP: (112-144)/(57-82) 144/71 (11/13 0715) SpO2:  [92 %-96 %] 96 % (11/13 0715) Weight:  [81.4 kg] 81.4 kg (11/13 0333)  Physical Exam: General: -appearing, no acute distress Cardio: Regular rate, regular rhythm, no murmurs on exam. Pulm: Clear, no wheezing, no crackles. No increased work of breathing Abdominal: bowel sounds present, soft, non-tender, non-distended Extremities: no peripheral edema  Neuro: Denies SI, HI   Laboratory: Most recent CBC Lab Results  Component Value Date   WBC 8.1 06/04/2024   HGB 9.3 (L) 06/04/2024   HCT 29.1 (L) 06/04/2024   MCV 87.7 06/04/2024   PLT 200 06/04/2024   Most recent BMP    Latest Ref Rng & Units 06/05/2024    2:11 AM  BMP  Glucose 70 - 99 mg/dL 898   BUN 8 - 23 mg/dL 10   Creatinine 9.38 - 1.24 mg/dL 9.23   Sodium 864 - 854 mmol/L 136   Potassium 3.5 - 5.1 mmol/L 4.3   Chloride 98 - 111 mmol/L 104  CO2 22 - 32 mmol/L 23   Calcium 8.9 - 10.3 mg/dL 8.2    Imaging/Diagnostic Tests: Surgical Pathology: Mediastinal mass, anterior, excision: Classical Hodgkin's and Phoma, nodular sclerosis subtype.  The specimen consists of abundant dense fibrosis/sclerosis with small nodules of lymphoid tissue  that contain scattered large atypical cells with irregular nuclear contour and prominent nuclear with Reed-Sternberg type cells.    Elodie Palma, MD 06/06/2024, 7:20 AM  PGY-1, [facility] FPTS Intern pager: [phone], text pages welcome Secure chat group [facility] Teaching Service

## 2024-06-06 NOTE — Progress Notes (Signed)
 Physical Therapy Treatment Patient Details Name: David [name] MRN: [id number] DOB: August 10, 1950 Today's Date: 06/06/2024   History of Present Illness 73 y.o. male presenting to ED 10/21 with generalized weakness and worsening aphasia for the last 72 hours. 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 Calcium was found to be 13.1. 11/6 s/p VATS with bx of L mediastinal mass. 11/7 L CT removed in AM. PMH: s/p C5-C6-C7 decompressive laminectomy with evacuation of cervical epidural abscess 9/26, HTN, GERD, colon polyp, and skin cancer.    PT Comments  Pt received in supine, with daughter present. Pt agreeable to perform gait and stairs with SPTA. Pt c/o L arm pain 4/10 stating it was a deep muscle pain. Pt ambulated in hall with RW for 170 ft taking a seated rest break halfway through. VSS throughout.  Pt requires CGA with gait and min A with stairs. Pt has made good progress and will continue to benefit from continued skilled PT in order to increase endurance, pain mgmt, and work towards functional mobility goals.      If plan is discharge home, recommend the following: A little help with walking and/or transfers;A little help with bathing/dressing/bathroom;Assistance with cooking/housework;Direct supervision/assist for medications management;Direct supervision/assist for financial management;Assist for transportation;Help with stairs or ramp for entrance;Supervision due to cognitive status   Can travel by private vehicle        Equipment Recommendations  None recommended by PT    Recommendations for Other Services       Precautions / Restrictions Precautions Precautions: Fall Recall of Precautions/Restrictions: Intact Precaution/Restrictions Comments: recent fall day PTA Restrictions Weight Bearing Restrictions Per Provider Order: No     Mobility  Bed Mobility Overal bed mobility: Needs Assistance Bed Mobility: Supine to Sit     Supine to sit:  Min assist, Used rails, HOB elevated     General bed mobility comments: increased time and requiring hand held assist even after cues to logroll pt still couldnt sit up on his own    Transfers Overall transfer level: Needs assistance Equipment used: Rolling walker (2 wheels) Transfers: Sit to/from Stand, Bed to chair/wheelchair/BSC Sit to Stand: Contact guard assist   Step pivot transfers: Contact guard assist       General transfer comment: Pt stood from EOB to RW and RW <>chair CGA, good recall of sequencing but verbal cue to push of surface instead of pulling up to walker.    Ambulation/Gait Ambulation/Gait assistance: Contact guard assist, Supervision, +2 safety/equipment (Chair follow) Gait Distance (Feet): 170 Feet Assistive device: Rolling walker (2 wheels) Gait Pattern/deviations: Step-through pattern, Decreased dorsiflexion - left, Knee flexed in stance - left, Decreased stride length Gait velocity: decreased     General Gait Details: Pt ambulated 71ft before taking a seated rest break stating his L arm was hurting and he was fatigued. Pt then ambulated 85 feet back to room and sat in recliner. VSS throughout.   Stairs Stairs: Yes Stairs assistance: Min assist, +2 safety/equipment Stair Management: Forwards, Backwards, Step to pattern, One rail Right Number of Stairs: 1 General stair comments: Pt performed one step outside of room and deferred any more   Wheelchair Mobility     Tilt Bed    Modified Rankin (Stroke Patients Only)       Balance Overall balance assessment: Needs assistance Sitting-balance support: Feet supported, No upper extremity supported Sitting balance-Leahy Scale: Fair Sitting balance - Comments: EOB   Standing balance support: During functional activity, Bilateral upper  extremity supported, Single extremity supported Standing balance-Leahy Scale: Poor Standing balance comment: reliant on external support when standing                             Communication Communication Communication: No apparent difficulties  Cognition Arousal: Alert Behavior During Therapy: WFL for tasks assessed/performed   PT - Cognitive impairments: Safety/Judgement                         Following commands: Intact Following commands impaired: Follows multi-step commands inconsistently, Follows multi-step commands with increased time, Follows one step commands with increased time    Cueing Cueing Techniques: Verbal cues  Exercises      General Comments General comments (skin integrity, edema, etc.): Daughter present in room. VSS throughout. Pt sitting in recliner to have lunch when it arrives. Pt c/o L arm pain deep in the muscle pointing at bicep.      Pertinent Vitals/Pain Pain Assessment Pain Assessment: 0-10 Pain Score: 4  Faces Pain Scale: Hurts a little bit Pain Location: L arm deep pain Pain Descriptors / Indicators: Discomfort, Guarding, Sore, Aching Pain Intervention(s): Monitored during session, Limited activity within patient's tolerance    Home Living                          Prior Function            PT Goals (current goals can now be found in the care plan section) Acute Rehab PT Goals Patient Stated Goal: go home PT Goal Formulation: With patient/family Time For Goal Achievement: 06/12/24 Progress towards PT goals: Progressing toward goals    Frequency    Min 2X/week      PT Plan      Co-evaluation              AM-PAC PT 6 Clicks Mobility   Outcome Measure  Help needed turning from your back to your side while in a flat bed without using bedrails?: A Little Help needed moving from lying on your back to sitting on the side of a flat bed without using bedrails?: A Lot Help needed moving to and from a bed to a chair (including a wheelchair)?: A Little Help needed standing up from a chair using your arms (e.g., wheelchair or bedside chair)?: A  Little Help needed to walk in hospital room?: A Little Help needed climbing 3-5 steps with a railing? : A Little 6 Click Score: 17    End of Session Equipment Utilized During Treatment: Gait belt Activity Tolerance: Patient limited by fatigue;Patient limited by pain;Patient tolerated treatment well Patient left: in chair;with call bell/phone within reach;with family/visitor present Nurse Communication: Mobility status PT Visit Diagnosis: Unsteadiness on feet (R26.81);Muscle weakness (generalized) (M62.81);History of falling (Z91.81);Difficulty in walking, not elsewhere classified (R26.2)     Time: 1241-1301 PT Time Calculation (min) (ACUTE ONLY): 20 min  Charges:    $Gait Training: 8-22 mins PT General Charges $$ ACUTE PT VISIT: 1 Visit                     Johnnie Gaynelle JACQUE Johnnie Jaydah Stahle 06/06/2024, 2:51 PM

## 2024-06-07 LAB — PTH-RELATED PEPTIDE: PTH-related peptide: <2 pmol/L

## 2024-06-10 ENCOUNTER — Other Ambulatory Visit: Payer: Self-pay | Admitting: Thoracic Surgery (Cardiothoracic Vascular Surgery)

## 2024-06-10 DIAGNOSIS — J9859 Other diseases of mediastinum, not elsewhere classified: Secondary | ICD-10-CM

## 2024-06-10 LAB — ACID FAST CULTURE WITH REFLEXED SENSITIVITIES (MYCOBACTERIA): Acid Fast Culture: NEGATIVE

## 2024-06-11 ENCOUNTER — Ambulatory Visit
Admission: RE | Admit: 2024-06-11 | Discharge: 2024-06-11 | Disposition: A | Discharge status: Home / Self Care | Source: Ambulatory Visit | Attending: Cardiology | Admitting: Cardiology

## 2024-06-11 ENCOUNTER — Ambulatory Visit (INDEPENDENT_AMBULATORY_CARE_PROVIDER_SITE_OTHER): Payer: Self-pay

## 2024-06-11 VITALS — BP 112/69 | HR 71 | Resp 20 | Wt 180.0 lb

## 2024-06-11 DIAGNOSIS — Z9889 Other specified postprocedural states: Secondary | ICD-10-CM | POA: Diagnosis present

## 2024-06-11 DIAGNOSIS — J9859 Other diseases of mediastinum, not elsewhere classified: Secondary | ICD-10-CM | POA: Diagnosis present

## 2024-06-11 NOTE — Progress Notes (Unsigned)
 [address] [place] [address]             [id number]       HPI:  Patient returns for routine postoperative follow-up having undergone Left VATS biopsy of anterior mediastinal mass on 05/30/2024 with Dr. Kerrin . The patient's early postoperative recovery while in the hospital was notable for having hematology and oncology consulted since pathology of biopsy confirmed Hodgkin's lymphoma.  His hypercalcemia was resolved prior to discharge.  He was stable for discharge home on 06/06/2024.  Since hospital discharge the patient reports that he has been doing ok.  His family did call EMS today prior to this visit due to patient having syncopal episode.  He states that he got up to walk to the kitchen and reported feeling very off/lightheaded and he felt like he was going to fall down.  He was able to sit down and did not fall or hit his head.  EMS assessed him and stated that he might have a possible electrolyte imbalance and to follow-up at an office visit.  He was not taken to the emergency department.  He is currently taking 100 mg of Toprol -XL.  At the time of syncopal episode family reports blood pressures 76/40s.  By the time EMS arrived blood pressure improved to 100s/60.   His incision sites have been healing well and his pain is controlled.  He has noticed some weakness in his right hand which started after surgery and this has been slowly improving with time.  He denies chest pain, shortness of breath and lower leg swelling.   Allergies as of 06/11/2024       Reactions   Bee Venom Anaphylaxis, Swelling   Honey Bee Venom Anaphylaxis, Swelling        Medication List        Accurate as of June 11, 2024  2:39 PM. If you have any questions, ask your nurse or doctor.          acetaminophen  500 MG tablet Commonly known as: TYLENOL  Take 1,000 mg by mouth See admin instructions. Take 2 tablets (1000mg ) by mouth every evening (with tramadol)  if needed for pain.   Cod Liver Oil Caps Take 1 capsule by mouth every evening.   COQ10 PO Take 1 capsule by mouth daily.   Eliquis 5 MG Tabs tablet Generic drug: apixaban Take 1 tablet (5 mg total) by mouth 2 (two) times daily.   fluorouracil 5 % cream Commonly known as: EFUDEX Apply 1 application  topically 2 (two) times daily as needed (skin spots).   GARLIC PO Take 1 tablet by mouth every evening.   GINGER ROOT PO Take 1 tablet by mouth 2 (two) times daily.   L-GLUTAMINE PO Take 1 tablet by mouth 2 (two) times daily.   Mens 50+ Multivitamin Tabs Take 1 tablet by mouth daily.   metoprolol  succinate 100 MG 24 hr tablet Commonly known as: TOPROL -XL Take 1 tablet (100 mg total) by mouth daily. Take with or immediately following a meal.   mirtazapine 15 MG tablet Commonly known as: REMERON Take 1 tablet (15 mg total) by mouth at bedtime.   pantoprazole 40 MG tablet Commonly known as: PROTONIX Take 1 tablet (40 mg total) by mouth daily.   polyethylene glycol powder 17 GM/SCOOP powder Commonly known as: GLYCOLAX/MIRALAX Take 17 g by mouth 2 (two) times daily. Dissolve 1 capful (17g) in 4-8 ounces of liquid and  take by mouth daily.   PROBIOTIC PO Take 1 capsule by mouth daily.   sennosides-docusate sodium 8.6-50 MG tablet Commonly known as: SENOKOT-S Take 1 tablet by mouth every evening.   tamsulosin 0.4 MG Caps capsule Commonly known as: FLOMAX Take 1 capsule (0.4 mg total) by mouth daily.   tiZANidine 4 MG capsule Commonly known as: ZANAFLEX Take 4 mg by mouth See admin instructions. Take 1 tablet (4mg ) by mouth every evening if needed for pain, muscle spasms.   traMADol 50 MG tablet Commonly known as: ULTRAM Take 50 mg by mouth See admin instructions. Take 1 tablet (50mg ) by mouth every evening if needed for pain.   VITAMIN B-12 PO Take 1 tablet by mouth daily.   VITAMIN D-3 PO Take 1 capsule by mouth daily.         ROS  Review of Systems   Constitutional:  Positive for malaise/fatigue.  Respiratory:  Negative for cough and shortness of breath.   Cardiovascular:  Negative for chest pain and leg swelling.  Neurological:  Positive for focal weakness. Negative for speech change and headaches.       Right hand weakness     BP 112/69 (BP Location: Right Arm, Patient Position: Sitting, Cuff Size: Normal)   Pulse 71   Resp 20   Wt 180 lb (81.6 kg)   SpO2 98% Comment: RA  BMI 24.41 kg/m   Physical Exam Constitutional:      Appearance: Normal appearance.  HENT:     Head: Normocephalic and atraumatic.  Cardiovascular:     Rate and Rhythm: Normal rate and regular rhythm.     Heart sounds: Normal heart sounds, S1 normal and S2 normal.  Pulmonary:     Effort: Pulmonary effort is normal.     Breath sounds: Normal breath sounds.  Musculoskeletal:     Right hand: No deformity. Decreased range of motion. Decreased strength of thumb/finger opposition. Normal capillary refill.     Left hand: No deformity. Normal range of motion. Normal strength. Normal capillary refill.  Skin:    General: Skin is warm and dry.      Neurological:     General: No focal deficit present.     Mental Status: He is alert and oriented to person, place, and time.     Cranial Nerves: Cranial nerves 2-12 are intact. No cranial nerve deficit or facial asymmetry.     Motor: No pronator drift.     Coordination: Coordination normal.     Gait: Gait is intact.       Imaging: CLINICAL DATA:  Mediastinal mass.   EXAM: CHEST - 2 VIEW   COMPARISON:  Tray shin of dated 06/01/2024 and CT dated 05/28/2024.   FINDINGS: Triangular opacity over the posterior left lung base on the lateral view may be related to atelectasis secondary to eventration of the left hemidiaphragm. Pneumonia is not excluded. No pleural effusion or pneumothorax. The cardiac silhouette is within limits. Widening of the upper mediastinum in keeping with known mediastinal mass.  No acute osseous pathology.   IMPRESSION: 1. Left lung base atelectasis versus pneumonia. 2. Widening of the upper mediastinum in keeping with known mediastinal mass.     Electronically Signed   By: Vanetta Chou M.D.   On: 06/11/2024 14:16     Assessment/Plan:  S/P video-assisted thoracoscopic surgery (VATS) - We reviewed today's x-ray which shows possible left lung base atelectasis.  Encourage patient to continue use incentive spirometer - BMP ordered to be drawn  today to review electrolyte imbalance.   - He is to continue to try to stay hydrated.  Discussed that he can drink electrolyte supplementation - He should try to continue small meals throughout the day to help regain his appetite. - Syncopal episode today with orthostatic hypotension, recommended that he take 50 mg of metoprolol  in the morning and 50 mg at night to see if this will help with hypotension.  He is to continue to check his blood pressure daily and report readings to his cardiologist.  Alarm symptoms discussed and patient and his family state understanding of proper follow-up - He has visit with oncology for Hodgkin's lymphoma on 06/13/2024  -Follow-up with [facility] in 3 weeks with chest x-ray   Manuelita CHRISTELLA Rough, PA-C 2:39 PM 06/11/24

## 2024-06-12 ENCOUNTER — Other Ambulatory Visit: Payer: Self-pay

## 2024-06-12 LAB — BASIC METABOLIC PANEL WITH GFR
BUN/Creatinine Ratio: 18 (ref 10–24)
BUN: 14 mg/dL (ref 8–27)
CO2: 20 mmol/L (ref 20–29)
Calcium: 9 mg/dL (ref 8.6–10.2)
Chloride: 95 mmol/L — ABNORMAL LOW (ref 96–106)
Creatinine, Ser: 0.79 mg/dL (ref 0.76–1.27)
Glucose: 100 mg/dL — ABNORMAL HIGH (ref 70–99)
Potassium: 4.7 mmol/L (ref 3.5–5.2)
Sodium: 134 mmol/L (ref 134–144)
eGFR: 93 mL/min/1.73 (ref >59)

## 2024-06-12 NOTE — Progress Notes (Signed)
-  Called patient and reviewed BNP results which were normal. -He denies further episode of syncope and orthostatic hypotension -He should continue with metoprolol  50 mg BID

## 2024-06-12 NOTE — Patient Instructions (Signed)
Follow up in 3 weeks with chest xray

## 2024-06-13 ENCOUNTER — Encounter: Payer: Self-pay | Admitting: Hematology

## 2024-06-13 ENCOUNTER — Inpatient Hospital Stay: Attending: Hematology | Admitting: Hematology

## 2024-06-13 ENCOUNTER — Inpatient Hospital Stay

## 2024-06-13 VITALS — BP 139/60 | HR 77 | Temp 97.3°F | Resp 18 | Wt 179.4 lb

## 2024-06-13 DIAGNOSIS — C8118 Nodular sclerosis classical Hodgkin lymphoma, lymph nodes of multiple sites: Secondary | ICD-10-CM

## 2024-06-13 DIAGNOSIS — Z7901 Long term (current) use of anticoagulants: Secondary | ICD-10-CM | POA: Insufficient documentation

## 2024-06-13 DIAGNOSIS — Z79899 Other long term (current) drug therapy: Secondary | ICD-10-CM | POA: Insufficient documentation

## 2024-06-13 DIAGNOSIS — I4891 Unspecified atrial fibrillation: Secondary | ICD-10-CM | POA: Diagnosis not present

## 2024-06-13 DIAGNOSIS — I341 Nonrheumatic mitral (valve) prolapse: Secondary | ICD-10-CM | POA: Insufficient documentation

## 2024-06-13 DIAGNOSIS — Z8549 Personal history of malignant neoplasm of other male genital organs: Secondary | ICD-10-CM | POA: Insufficient documentation

## 2024-06-13 LAB — CBC WITH DIFFERENTIAL (CANCER CENTER ONLY)
Abs Immature Granulocytes: 0.05 K/uL (ref 0.00–0.07)
Basophils Absolute: 0 K/uL (ref 0.0–0.1)
Basophils Relative: 0 %
Eosinophils Absolute: 0.1 K/uL (ref 0.0–0.5)
Eosinophils Relative: 1 %
HCT: 28.7 % — ABNORMAL LOW (ref 39.0–52.0)
Hemoglobin: 9.4 g/dL — ABNORMAL LOW (ref 13.0–17.0)
Immature Granulocytes: 1 %
Lymphocytes Relative: 28 %
Lymphs Abs: 2.6 K/uL (ref 0.7–4.0)
MCH: 27.6 pg (ref 26.0–34.0)
MCHC: 32.8 g/dL (ref 30.0–36.0)
MCV: 84.2 fL (ref 80.0–100.0)
Monocytes Absolute: 1 K/uL (ref 0.1–1.0)
Monocytes Relative: 11 %
Neutro Abs: 5.4 K/uL (ref 1.7–7.7)
Neutrophils Relative %: 59 %
Platelet Count: 367 K/uL (ref 150–400)
RBC: 3.41 MIL/uL — ABNORMAL LOW (ref 4.22–5.81)
RDW: 16.4 % — ABNORMAL HIGH (ref 11.5–15.5)
WBC Count: 9.2 K/uL (ref 4.0–10.5)
nRBC: 0 % (ref 0.0–0.2)

## 2024-06-13 LAB — SEDIMENTATION RATE: Sed Rate: 140 mm/h — ABNORMAL HIGH (ref 0–16)

## 2024-06-13 LAB — CMP (CANCER CENTER ONLY)
ALT: 31 U/L (ref 0–44)
AST: 20 U/L (ref 15–41)
Albumin: 3.7 g/dL (ref 3.5–5.0)
Alkaline Phosphatase: 194 U/L — ABNORMAL HIGH (ref 38–126)
Anion gap: 13 (ref 5–15)
BUN: 12 mg/dL (ref 8–23)
CO2: 24 mmol/L (ref 22–32)
Calcium: 9.7 mg/dL (ref 8.9–10.3)
Chloride: 98 mmol/L (ref 98–111)
Creatinine: 0.68 mg/dL (ref 0.61–1.24)
GFR, Estimated: >60 mL/min (ref >60)
Glucose, Bld: 105 mg/dL — ABNORMAL HIGH (ref 70–99)
Potassium: 4 mmol/L (ref 3.5–5.1)
Sodium: 136 mmol/L (ref 135–145)
Total Bilirubin: 0.4 mg/dL (ref 0.0–1.2)
Total Protein: 7.4 g/dL (ref 6.5–8.1)

## 2024-06-13 LAB — TSH: TSH: 5.32 u[IU]/mL — ABNORMAL HIGH (ref 0.350–4.500)

## 2024-06-13 LAB — MAGNESIUM: Magnesium: 1.9 mg/dL (ref 1.7–2.4)

## 2024-06-13 LAB — HEPATITIS C ANTIBODY: HCV Ab: NONREACTIVE

## 2024-06-13 LAB — HEPATITIS B SURFACE ANTIGEN: Hepatitis B Surface Ag: NONREACTIVE

## 2024-06-13 LAB — PHOSPHORUS: Phosphorus: 3.5 mg/dL (ref 2.5–4.6)

## 2024-06-13 MED ORDER — LIDOCAINE-PRILOCAINE 2.5-2.5 % EX CREA: TOPICAL_CREAM | CUTANEOUS | 3 refills | Status: AC

## 2024-06-13 MED ORDER — ONDANSETRON HCL 8 MG PO TABS
8.0000 mg | ORAL_TABLET | Freq: Three times a day (TID) | ORAL | 1 refills | Status: AC | PRN
Start: 2024-06-13 — End: ?

## 2024-06-13 MED ORDER — DEXAMETHASONE 4 MG PO TABS: ORAL_TABLET | ORAL | 1 refills | Status: AC

## 2024-06-13 MED ORDER — PROCHLORPERAZINE MALEATE 10 MG PO TABS
10.0000 mg | ORAL_TABLET | Freq: Four times a day (QID) | ORAL | 1 refills | Status: AC | PRN
Start: 2024-06-13 — End: ?

## 2024-06-13 NOTE — Progress Notes (Signed)
 START ON PATHWAY REGIMEN - Lymphoma and CLL     A cycle is every 28 days:     Dacarbazine      Doxorubicin      Vinblastine      Nivolumab   **Always confirm dose/schedule in your pharmacy ordering system**  Patient Characteristics: Classic Hodgkin Lymphoma, First Line, Stage III/IV Disease Type: Not Applicable Disease Type: Not Applicable Disease Type: Classic Hodgkin Lymphoma Line of therapy: First Line Intent of Therapy: Curative Intent, Discussed with Patient

## 2024-06-13 NOTE — Progress Notes (Signed)
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 line in place. 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 The A1 segment of the anterior cerebral artery is absent. The middle cerebral artery is normal. Left anterior circulation: The internal carotid artery and middle cerebral arteries are normal. There is an azygos anterior cerebral artery. Posterior circulation: Both vertebral arteries are patent. There is no significant basilar stenosis. Both posterior cerebral arteries are patent without significant stenosis or proximal branch occlusion. No aneurysm. IMPRESSION: 1. No carotid artery stenosis 2. No significant vertebrobasilar or intracranial disease 3. 4.7 x 9.4 x 11 cm anterior  mediastinal mass Electronically Signed   By: Nancyann Burns M.D.   On: 05/28/2024 15:57   DG Chest Portable 1 View Result Date: 05/15/2024 CLINICAL DATA:  AFib. EXAM: PORTABLE CHEST 1 VIEW COMPARISON:  05/14/2024 FINDINGS: Right-sided PICC  line unchanged. Lungs are adequately inflated. There is no acute airspace process or effusion. Cardiomediastinal silhouette and remainder of the exam is unchanged for IMPRESSION: No acute cardiopulmonary disease. Electronically Signed   By: Toribio Agreste M.D.   On: 05/15/2024 12:32   DG Chest Portable 1 View Result Date: 05/14/2024 CLINICAL DATA:  Bilateral ankle swelling, recent surgery, r/o effusion EXAM: PORTABLE CHEST - 1 VIEW COMPARISON:  08/30/2017 FINDINGS: Right PICC terminating in the mid SVC. Radiopaque metallic structure overlying the left upper lung zone, likely external to the patient. No focal airspace consolidation, pleural effusion, or pneumothorax. Mild cardiomegaly. Tortuous aorta with aortic atherosclerosis. Multilevel thoracic osteophytosis. IMPRESSION: 1. Mild cardiomegaly. No pneumonia or pulmonary edema. 2. Right PICC terminates in the mid SVC. Electronically Signed   By: Rogelia Myers M.D.   On: 05/14/2024 18:36    ASSESSMENT & PLAN:   73 y.o. 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 Recommend urgent multidisciplinary evaluation (oncology/thoracic surgery) and tissue diagnosis.  2. Enlarged mediastinal lymph nodes, including high paratracheal and right hilar nodes.  3. Subcentimeter thyroid  nodules and a 15 mm peripherally calcified thyroid  isthmus nodule.  4. Indeterminate sclerosis at the lateral left fifth rib  - Explained etiologies of lymphoma, staging, and treatment options  - His tumor was EBV negative  - His lymphoma would be consider stage 3 based off involvement, but a PET  scan would assist in determination.  - Treatments would likely be targeted therapy, such as immunotherapy, but there may be a role for chemotherapy. Nivolumab  possibly, he does not have a history of neuropathy and on recent echo his EF was normal. (Nivo-AVD)  - Discussed port vs picx line, likely he'll need a picc line for some time due to mediastinal mass.  - Discussed possible side effects, such as neuropathy, hair loss, hair and nail changes.  - Encouraged adequate nutrition and hydration.  Will refer to nutritional therapy  Suggested multiple smaller meals throughout the day instead of fewer larger meals.  - Ordering Orders Placed This Encounter  Procedures   CBC with Differential (Cancer Center Only)   CMP (Cancer Center only)   Sedimentation rate   Hepatitis B surface antigen   Hepatitis B core antibody, total   Hepatitis C antibody   Magnesium    Phosphorus   TSH   FOLLOW-UP  Labs today Port a cath/PICC placed with IR ASAP PET/CT ASAP Chemo-education for Nivo-AVD ASAP Starting N-AVD treatment ASAP in 1-2 weeks (do not need to wait on PET/CT)   The total time spent in the appointment was 60 minutes* . All of the patient's questions were answered and the patient knows to call the clinic with any problems, questions, or concerns.  Emaline Saran MD MS AAHIVMS [facility] Hematology/Oncology Physician [facility]  *Total Encounter Time as defined by the Centers for Medicare and Medicaid Services includes, in addition to  the face-to-face time of a patient visit (documented in the note above) non-face-to-face time: obtaining and reviewing outside history, ordering and reviewing medications, tests or procedures, care coordination (communications with other health care professionals or caregivers) and documentation in the medical record.  I,Emily Lagle,acting as a neurosurgeon for Emaline Saran, MD.,have documented all relevant documentation on the behalf of Emaline Saran, MD,as  directed by  Emaline Saran, MD while in the presence of Emaline Saran, MD.  I have reviewed the above documentation for accuracy and completeness, and I agree with the above.  Larken Urias, MD

## 2024-06-14 ENCOUNTER — Other Ambulatory Visit: Payer: Self-pay

## 2024-06-14 LAB — HEPATITIS B CORE ANTIBODY, TOTAL: HEP B CORE AB: NEGATIVE

## 2024-06-17 ENCOUNTER — Inpatient Hospital Stay

## 2024-06-17 ENCOUNTER — Other Ambulatory Visit: Payer: Self-pay | Admitting: Hematology

## 2024-06-17 DIAGNOSIS — C8118 Nodular sclerosis classical Hodgkin lymphoma, lymph nodes of multiple sites: Secondary | ICD-10-CM

## 2024-06-19 ENCOUNTER — Other Ambulatory Visit: Payer: Self-pay

## 2024-06-19 ENCOUNTER — Inpatient Hospital Stay: Admitting: Licensed Clinical Social Worker

## 2024-06-19 ENCOUNTER — Other Ambulatory Visit (HOSPITAL_BASED_OUTPATIENT_CLINIC_OR_DEPARTMENT_OTHER): Payer: Self-pay

## 2024-06-19 ENCOUNTER — Encounter: Payer: Self-pay | Admitting: Hematology

## 2024-06-19 NOTE — Progress Notes (Signed)
 Pharmacist Chemotherapy Monitoring - Initial Assessment    Anticipated start date: 06/27/2024   The following has been reviewed per standard work regarding the patient's treatment regimen: The patient's diagnosis, treatment plan and drug doses, and organ/hematologic function Lab orders and baseline tests specific to treatment regimen  The treatment plan start date, drug sequencing, and pre-medications Prior authorization status  Patient's documented medication list, including drug-drug interaction screen and prescriptions for anti-emetics and supportive care specific to the treatment regimen The drug concentrations, fluid compatibility, administration routes, and timing of the medications to be used The patient's access for treatment and lifetime cumulative dose history, if applicable  The patient's medication allergies and previous infusion related reactions, if applicable   Changes made to treatment plan:  N/A  Follow up needed:  N/A   David [name], RPH, 06/19/2024  12:09 PM

## 2024-06-19 NOTE — Progress Notes (Signed)
 [facility] CSW Progress Note  Visual Merchandiser received a call from patient's daughter, Sharyne.  She inquired about Medicaid for her father who will begin receiving chemotherapy next week.  She would also like to receive caregiver counseling.    Interventions: It was determined during the conversation that patient will be receiving care at [facility].  CSW sent information to Devere Manna.  Encouraged daughter to contact [facility] Services for [employer] inquiry.      Macario CHRISTELLA Au, LCSW Clinical Social Worker [facility]

## 2024-06-20 ENCOUNTER — Encounter: Payer: Self-pay | Admitting: Hematology

## 2024-06-21 ENCOUNTER — Telehealth: Payer: Self-pay | Admitting: *Deleted

## 2024-06-21 ENCOUNTER — Encounter: Payer: Self-pay | Admitting: Hematology

## 2024-06-21 NOTE — Telephone Encounter (Signed)
 Completed David [name]'s daughters FMLA paperwork   Sent to provider to review, amend, sign and return to this nurse to return to claims benefit production designer, theatre/television/film.

## 2024-06-23 ENCOUNTER — Encounter (HOSPITAL_COMMUNITY): Payer: Self-pay | Admitting: Radiology

## 2024-06-23 ENCOUNTER — Other Ambulatory Visit: Payer: Self-pay | Admitting: Student

## 2024-06-24 ENCOUNTER — Encounter (HOSPITAL_COMMUNITY)
Admission: RE | Admit: 2024-06-24 | Discharge: 2024-06-24 | Disposition: A | Discharge status: Home / Self Care | Source: Ambulatory Visit | Attending: Hematology | Admitting: Hematology

## 2024-06-24 ENCOUNTER — Other Ambulatory Visit: Payer: Self-pay

## 2024-06-24 ENCOUNTER — Other Ambulatory Visit: Payer: Self-pay | Admitting: Student

## 2024-06-24 DIAGNOSIS — C8118 Nodular sclerosis classical Hodgkin lymphoma, lymph nodes of multiple sites: Secondary | ICD-10-CM | POA: Diagnosis present

## 2024-06-24 LAB — GLUCOSE, CAPILLARY: Glucose-Capillary: 110 mg/dL — ABNORMAL HIGH (ref 70–99)

## 2024-06-24 MED ORDER — FLUDEOXYGLUCOSE F - 18 (FDG) INJECTION
8.9000 | Freq: Once | INTRAVENOUS | Status: AC
  Administered 2024-06-24: 8.9 via INTRAVENOUS

## 2024-06-24 NOTE — H&P (Signed)
 Chief Complaint: Chemotherapy access. Request is for portacath placement  Referring Physician(s): Onesimo Emaline Brink  Supervising Physician: Vanice Revel  Patient Status: [facility] - Out-pt  History of Present Illness: David [name] is a 73 y.o. male 73 y.o. male. Outpatient. 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 Cytology resulted in [facility] lymphoma. Team is requesting a portacath for chemotherapy access.  Currently without any significant complaints. Patient alert and laying in bed,calm. Denies any fevers, headache, chest pain, SOB, cough, abdominal pain, nausea, vomiting or bleeding.   No lines per EPIC. CT chest dated 28.4.25 shows RIJ is accessible. Labs from 11.20.25 are within acceptable parameters. Patient is on eliquis . No pertinent allergies  Return precautions and treatment recommendations and follow-up discussed with the patient *** who is agreeable with the plan.    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   Chicken pox    History of skin cancer    Polyp of colon     Past Surgical History:  Procedure Laterality Date   APPENDECTOMY     CIRCUMCISION     at age 46 yrs old   COLONOSCOPY     FORAMINOTOMY, SPINE, CERVICAL, 1 LEVEL N/A 04/19/2024   Procedure: CERVICAL FIVE- SIX-SEVEN LAMINECTOMY FOR EPIDURAL ABSCESS;  Surgeon: Louis Shove, MD;  Location: [facility] OR;  Service: Neurosurgery;  Laterality: N/A;  CERVICAL FIVE- SIX-SEVEN LAMINECTOMY  FOR EPIDURAL ABSCESS   INTERCOSTAL NERVE BLOCK Left 05/30/2024   Procedure: BLOCK, NERVE, INTERCOSTAL;  Surgeon: Kerrin Elspeth BROCKS, MD;  Location: [facility] OR;  Service: Thoracic;  Laterality: Left;   penile biopsy  03/01/2022   VIDEO ASSISTED THORACOSCOPY (VATS)/ LOBECTOMY N/A 05/30/2024    Procedure: VIDEO ASSISTED THORACOSCOPY (VATS)/ LOBECTOMY;  Surgeon: Kerrin Elspeth BROCKS, MD;  Location: [facility] OR;  Service: Thoracic;  Laterality: N/A;  [place] OR VATS FOR BIOPSY OF MEDIASTINAL MASS    Allergies: Bee venom and Honey bee venom  Medications: Prior to Admission medications   Medication Sig Start Date End Date Taking? Authorizing Provider  acetaminophen  (TYLENOL ) 500 MG tablet Take 1,000 mg by mouth See admin instructions. Take 2 tablets (1000mg ) by mouth every evening (with tramadol ) if needed for pain.    [provider]  apixaban  (ELIQUIS ) 5 MG TABS tablet Take 1 tablet (5 mg total) by mouth 2 (two) times daily. 06/06/24   Diona Perkins, MD  Cholecalciferol (VITAMIN D -3 PO) Take 1 capsule by mouth daily.    [provider]  [facility] Liver Oil CAPS Take 1 capsule by mouth every evening.    [provider]  Coenzyme Q10 (COQ10 PO) Take 1 capsule by mouth daily.    [provider]  Cyanocobalamin (VITAMIN B-12 PO) Take 1 tablet by mouth daily.    [provider]  dexamethasone  (DECADRON ) 4 MG tablet Take 2 tablets (8 mg) by mouth daily for 3 days starting the day after chemotherapy. Take with food. 06/13/24   Kale, Gautam Kishore, MD  fluorouracil (EFUDEX) 5 % cream Apply 1 application  topically 2 (two) times daily as needed (skin spots).    [provider]  GARLIC PO Take 1 tablet by mouth every evening.    [provider]  Ginger, Zingiber officinalis, (GINGER ROOT PO) Take 1 tablet by mouth 2 (two)  times daily.    [provider]  L-GLUTAMINE PO Take 1 tablet by mouth 2 (two) times daily.    [provider]  lidocaine -prilocaine  (EMLA ) cream Apply to affected area once 06/13/24   Kale, Gautam Kishore, MD  metoprolol  succinate (TOPROL -XL) 100 MG 24 hr tablet Take 1 tablet (100 mg total) by mouth daily. Take with or immediately following a meal. 06/06/24 07/06/24  Diona Perkins, MD  mirtazapine  (REMERON )  15 MG tablet Take 1 tablet (15 mg total) by mouth at bedtime. 06/06/24   Diona Perkins, MD  Multiple Vitamins-Minerals (MENS 50+ MULTIVITAMIN) TABS Take 1 tablet by mouth daily.    [provider]  ondansetron  (ZOFRAN ) 8 MG tablet Take 1 tablet (8 mg total) by mouth every 8 (eight) hours as needed for nausea or vomiting. Start on the third day after chemotherapy. 06/13/24   Onesimo Emaline Brink, MD  pantoprazole  (PROTONIX ) 40 MG tablet Take 1 tablet (40 mg total) by mouth daily. 06/06/24   Diona Perkins, MD  polyethylene glycol powder (GLYCOLAX /MIRALAX ) 17 GM/SCOOP powder Take 17 g by mouth 2 (two) times daily. Dissolve 1 capful (17g) in 4-8 ounces of liquid and take by mouth daily. 06/06/24   Diona Perkins, MD  Probiotic Product (PROBIOTIC PO) Take 1 capsule by mouth daily.    [provider]  prochlorperazine  (COMPAZINE ) 10 MG tablet Take 1 tablet (10 mg total) by mouth every 6 (six) hours as needed for nausea or vomiting. 06/13/24   Onesimo Emaline Brink, MD  sennosides-docusate sodium  (SENOKOT-S) 8.6-50 MG tablet Take 1 tablet by mouth every evening.    [provider]  tamsulosin  (FLOMAX ) 0.4 MG CAPS capsule Take 1 capsule (0.4 mg total) by mouth daily. 06/06/24   Diona Perkins, MD  tiZANidine  (ZANAFLEX ) 4 MG capsule Take 4 mg by mouth See admin instructions. Take 1 tablet (4mg ) by mouth every evening if needed for pain, muscle spasms.    [provider]  traMADol  (ULTRAM ) 50 MG tablet Take 50 mg by mouth See admin instructions. Take 1 tablet (50mg ) by mouth every evening if needed for pain. 05/13/24   [provider]     Family History  Problem Relation Age of Onset   Arthritis Mother    Diabetes Mother    Heart attack Father    Heart attack Brother    Stroke Brother    Hypertension Brother    Hypertension Brother     Social History   Socioeconomic History   Marital status: Married    Spouse name: Not on file   Number of children: Not on file    Years of education: Not on file   Highest education level: Not on file  Occupational History   Not on file  Tobacco Use   Smoking status: Never   Smokeless tobacco: Never  Vaping Use   Vaping status: Never Used  Substance and Sexual Activity   Alcohol use: Yes    Comment: occasionally   Drug use: No   Sexual activity: Not on file  Other Topics Concern   Not on file  Social History Narrative   Not on file   Social Drivers of Health   Financial Resource Strain: Not on file  Food Insecurity: No Food Insecurity (06/13/2024)   Hunger Vital Sign    Worried About Running Out of Food in the Last Year: Never true    Ran Out of Food in the Last Year: Never true  Transportation Needs: No Transportation Needs (06/13/2024)  PRAPARE - Administrator, Civil Service (Medical): No    Lack of Transportation (Non-Medical): No  Physical Activity: Not on file  Stress: Not on file  Social Connections: Moderately Integrated (05/28/2024)   Social Connection and Isolation Panel    Frequency of Communication with Friends and Family: More than three times a week    Frequency of Social Gatherings with Friends and Family: More than three times a week    Attends Religious Services: More than 4 times per year    Active Member of [employer] or Organizations: Yes    Attends Engineer, Structural: More than 4 times per year    Marital Status: Divorced    ECOG Status: {CHL ONC ECOG ED:[id number]}  Review of Systems: A 12 point ROS discussed and pertinent positives are indicated in the HPI above.  All other systems are negative.  Review of Systems  Vital Signs: There were no vitals taken for this visit.  Advance Care Plan: {Advance Care Eojw:73180}    Physical Exam  Imaging: DG Chest 2 View Result Date: 06/11/2024 CLINICAL DATA:  Mediastinal mass. EXAM: CHEST - 2 VIEW COMPARISON:  Tray shin of dated 06/01/2024 and CT dated 05/28/2024. FINDINGS: Triangular opacity over the  posterior left lung base on the lateral view may be related to atelectasis secondary to eventration of the left hemidiaphragm. Pneumonia is not excluded. No pleural effusion or pneumothorax. The cardiac silhouette is within limits. Widening of the upper mediastinum in keeping with known mediastinal mass. No acute osseous pathology. IMPRESSION: 1. Left lung base atelectasis versus pneumonia. 2. Widening of the upper mediastinum in keeping with known mediastinal mass. Electronically Signed   By: Vanetta Chou M.D.   On: 06/11/2024 14:16   DG Chest Port 1 View Result Date: 06/01/2024 CLINICAL DATA:  Pneumothorax. EXAM: PORTABLE CHEST 1 VIEW COMPARISON:  Radiograph yesterday. FINDINGS: Small left pneumothorax is slightly decreased in size from yesterday's exam, just above the posterior third rib. The right upper extremity PICC is been removed. Background hyperinflation and coarse lung markings are unchanged. Stable heart size and mediastinal contours. Persistent blunting of both costophrenic angles. Remote right rib fractures. IMPRESSION: 1. Small left pneumothorax is slightly decreased in size from yesterday's exam. 2. Interval removal of right upper extremity PICC. Electronically Signed   By: Andrea Gasman M.D.   On: 06/01/2024 13:04   DG Chest 2 View Result Date: 05/31/2024 EXAM: 2 VIEW(S) XRAY OF THE CHEST 05/31/2024 11:58:00 AM COMPARISON: 05/31/2024 CLINICAL HISTORY: Pneumothorax FINDINGS: LINES, TUBES AND DEVICES: Right upper extremity PICC in place with tip overlying the superior cavoatrial junction. Left chest drain removed. LUNGS AND PLEURA: Small left apical pneumothorax, visceral pleural line 1.4 cm from chest wall. Small bilateral pleural effusions. Hyperinflation and interstitial coarsening are nonspecific in this never smoker. No focal pulmonary opacity. No pulmonary edema. HEART AND MEDIASTINUM: No acute abnormality of the cardiac and mediastinal silhouettes. BONES AND SOFT TISSUES: No acute  osseous abnormality. IMPRESSION: 1. 5 percent left apical pneumothorax. 2. Small bilateral pleural effusions. Electronically signed by: Rockey Kilts MD 05/31/2024 05:15 PM EST RP Workstation: [id number]   DG Chest Port 1 View Result Date: 05/31/2024 EXAM: 1 VIEW(S) XRAY OF THE CHEST 05/31/2024 06:20:00 AM COMPARISON: 05/30/2024 CLINICAL HISTORY: Pneumothorax FINDINGS: LINES, TUBES AND DEVICES: Left chest drain in place. Right upper extremity PICC in place with tip at superior cavoatrial junction. LUNGS AND PLEURA: No focal pulmonary opacity. No pulmonary edema. No pleural effusion. No significant pneumothorax. HEART AND MEDIASTINUM:  No acute abnormality of the cardiac and mediastinal silhouettes. BONES AND SOFT TISSUES: Chronic right rib fracture deformities. IMPRESSION: 1. No significant pneumothorax. 2. Left chest drain in place. Electronically signed by: Katheleen Faes MD 05/31/2024 11:00 AM EST RP Workstation: [id number]   DG Chest Port 1 View Result Date: 05/30/2024 CLINICAL DATA:  Pneumothorax. EXAM: PORTABLE CHEST 1 VIEW COMPARISON:  Chest radiograph dated 05/15/2024. FINDINGS: Right-sided PICC with tip at the cavoatrial junction. No focal consolidation or pleural effusion, pneumothorax. The cardiac silhouette is within limits. Widening of the mediastinum in keeping with known mass. No acute osseous pathology. IMPRESSION: Right-sided PICC with tip at the cavoatrial junction. No pneumothorax. Electronically Signed   By: Vanetta Chou M.D.   On: 05/30/2024 19:25   CT CHEST W CONTRAST Result Date: 05/28/2024 EXAM: CT CHEST WITH CONTRAST 05/28/2024 09:58:07 PM TECHNIQUE: CT of the chest was performed with the administration of 75 mL of iohexol  (OMNIPAQUE ) 350 MG/ML injection. Multiplanar reformatted images are provided for review. Automated exposure control, iterative reconstruction, and/or weight based adjustment of the mA/kV was utilized to reduce the radiation dose to as low as reasonably achievable.  COMPARISON: None available. CLINICAL HISTORY: Mediastinal mass. FINDINGS: MEDIASTINUM: Heart and pericardium are unremarkable. The central airways are clear. There is an anterior mediastinal mass from the level of the origin of the great vessels extending to the level of the aortic valve. This measures approximately 5.0 x 8.0 x 11.4 cm. This partially encompasses the proximal right brachiocephalic and left common carotid arteries. There is marked narrowing of the distal left brachiocephalic vein and there is also compression of the superior vena cava. Right-sided central venous catheter tip is in the distal SVC. Atherosclerotic calcifications of the aorta and iliac arteries. There is a small hiatal hernia. Appears soft tissue mass extending between the IVC and right greater saphenous artery measuring 3.5 x 2.6 cm. There are subcentimeter hypodense thyroid  nodules. There is a peripherally calcified nodule in the isthmus of the thyroid  measuring 15 mm. LYMPH NODES: Enlarged high right paratracheal lymph nodes measuring up to 10 mm short axis. Large right hilar lymph nodes measure up to 10 mm short axis. No axillary lymphadenopathy. LUNGS AND PLEURA: There is a small amount of atelectasis or scarring in both lung bases and the lingula. The lungs are otherwise clear. No pleural effusion or pneumothorax. SOFT TISSUES/BONES: Revealed right fourth and fifth rib fractures. Degenerative changes of the spine. There is some sclerosis in the lateral left fifth rib, indeterminate. No acute abnormality of the soft tissues. UPPER ABDOMEN: Limited images of the upper abdomen demonstrates gallstones are present. No other acute abnormality. IMPRESSION: 1. Large anterior mediastinal mass (approx. 5.0 x 8.0 x 11.4 cm) partially encasing the proximal right brachiocephalic and left common carotid arteries, with marked narrowing of the left brachiocephalic vein and compression of the superior vena cava; findings raise concern for SVC  involvement. Recommend urgent multidisciplinary evaluation (oncology/thoracic surgery) and tissue diagnosis. 2. Enlarged mediastinal lymph nodes, including high paratracheal and right hilar nodes. 3. Subcentimeter thyroid  nodules and a 15 mm peripherally calcified thyroid  isthmus nodule. Given presumed age 29, recommend non-emergent thyroid  ultrasound due to 1.5 cm nodule. 4. Indeterminate sclerosis at the lateral left fifth rib. Electronically signed by: Greig Pique MD 05/28/2024 10:14 PM EST RP Workstation: [id number]   CT ABDOMEN PELVIS W CONTRAST Result Date: 05/28/2024 EXAM: CT ABDOMEN AND PELVIS WITH CONTRAST 05/28/2024 07:56:00 PM TECHNIQUE: CT of the abdomen and pelvis was performed with the administration of 75 mL  of iohexol  (OMNIPAQUE ) 350 MG/ML injection. Multiplanar reformatted images are provided for review. Automated exposure control, iterative reconstruction, and/or weight-based adjustment of the mA/kV was utilized to reduce the radiation dose to as low as reasonably achievable. COMPARISON: None available. CLINICAL HISTORY: Metastatic disease evaluation. FINDINGS: LOWER CHEST: No acute abnormality. LIVER: The liver is unremarkable. GALLBLADDER AND BILE DUCTS: Gallbladder is unremarkable. No biliary ductal dilatation. SPLEEN: No acute abnormality. PANCREAS: No acute abnormality. ADRENAL GLANDS: No acute abnormality. KIDNEYS, URETERS AND BLADDER: There is a finding near the fundus measuring 2.5 cm. There is trabeculated bladder wall. No stones in the kidneys or ureters. No hydronephrosis. No perinephric or periureteral stranding. GI AND BOWEL: Appendix is not visualized. Stomach demonstrates no acute abnormality. There is no bowel obstruction. PERITONEUM AND RETROPERITONEUM: There is presacral edema. No ascites. No free air. VASCULATURE: Aorta is normal in caliber. LYMPH NODES: There are enlarged left periaortic lymph nodes measuring up to 15 mm short axis. There are enlarged left external iliac  chain lymph nodes measuring up to 17 mm short axis. REPRODUCTIVE ORGANS: Prostate gland is prominent in size. BONES AND SOFT TISSUES: Degenerative changes affecting spine. No acute osseous abnormality. No focal soft tissue abnormality. IMPRESSION: 1. Enlarged left periaortic and left external iliac chain lymph nodes most consistent with metastatic disease. 2. Trabeculated bladder. 3. Prostatomegaly. 4. Presacral edema. Electronically signed by: Greig Pique MD 05/28/2024 08:10 PM EST RP Workstation: [id number]   MR BRAIN WO CONTRAST Result Date: 05/28/2024 EXAM: MRI BRAIN WITHOUT CONTRAST 05/28/2024 04:33:54 PM TECHNIQUE: Multiplanar multisequence MRI of the head/brain was performed without the administration of intravenous contrast. COMPARISON: CTA head and neck 05/28/2024. CLINICAL HISTORY: Neuro deficit, acute, stroke suspected. Generalized weakness, aphasia, and headache. FINDINGS: BRAIN AND VENTRICLES: There is no evidence of an acute infarct, mass, midline shift, hydrocephalus, or extra-axial fluid collection. There is mild cerebral atrophy. A 7 mm focus of susceptibility in the left parietal lobe may reflect a chronic hemorrhage or small cavernoma. A punctate chronic microhemorrhage is noted in the right occipital lobe. Small T2 hyperintensities in the cerebral white matter bilaterally are nonspecific but compatible with minimal chronic small vessel ischemic disease, not atypical for age. Major intracranial vascular flow voids are preserved. ORBITS: No acute abnormality. SINUSES AND MASTOIDS: No acute abnormality. BONES AND SOFT TISSUES: Normal marrow signal. No acute soft tissue abnormality. IMPRESSION: 1. Largely unremarkable appearance of the brain for age. No acute intracranial abnormality. Electronically signed by: Dasie Hamburg MD 05/28/2024 04:50 PM EST RP Workstation: [id number]   CT ANGIO HEAD NECK W WO CM Result Date: 05/28/2024 CLINICAL DATA:  Neuro deficit, acute stroke suspected EXAM: CT  ANGIOGRAPHY HEAD AND NECK WITH AND WITHOUT CONTRAST TECHNIQUE: Multidetector CT imaging of the head and neck was performed using the standard protocol during bolus administration of intravenous contrast. Multiplanar CT image reconstructions and MIPs were obtained to evaluate the vascular anatomy. Carotid stenosis measurements (when applicable) are obtained utilizing NASCET criteria, using the distal internal carotid diameter as the denominator. RADIATION DOSE REDUCTION: This exam was performed according to the departmental dose-optimization program which includes automated exposure control, adjustment of the mA and/or kV according to patient size and/or use of iterative reconstruction technique. CONTRAST:  75mL OMNIPAQUE  IOHEXOL  350 MG/ML SOLN COMPARISON:  None Available. FINDINGS: CT HEAD: Attenuation in the brain parenchyma is normal. There is no hemorrhage. No acute ischemic changes. No mass lesion. The ventricles are normal. Skull/sinuses/orbits: No significant abnormality. CTA NECK: CTA NECK Aortic arch: No proximal vessel stenosis. Right carotid:  Normal Left carotid: Normal Right vertebral: Normal Left vertebral: Normal Soft tissues: No significant abnormality Other comments: There is a right arm PICC line in place. 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 The A1 segment of the anterior cerebral artery is absent. The middle cerebral artery is normal. Left anterior circulation: The internal carotid artery and middle cerebral arteries are normal. There is an azygos anterior cerebral artery. Posterior circulation: Both vertebral arteries are patent. There is no significant basilar stenosis. Both posterior cerebral arteries are patent without significant stenosis or proximal branch occlusion. No aneurysm. IMPRESSION: 1. No carotid artery  stenosis 2. No significant vertebrobasilar or intracranial disease 3. 4.7 x 9.4 x 11 cm anterior mediastinal mass Electronically Signed   By: Nancyann Burns M.D.   On: 05/28/2024 15:57    Labs:  CBC: Recent Labs    06/02/24 0326 06/03/24 0334 06/04/24 0802 06/13/24 1132  WBC 8.8 8.7 8.1 9.2  HGB 8.6* 9.0* 9.3* 9.4*  HCT 26.4* 27.1* 29.1* 28.7*  PLT 219 226 200 367    COAGS: Recent Labs    05/28/24 1350 05/29/24 1808  INR 1.2 1.3*  APTT 33  --     BMP: Recent Labs    06/04/24 0802 06/05/24 0211 06/06/24 1053 06/11/24 1523 06/13/24 1132  NA 133* 136 136 134 136  K 4.5 4.3 4.6 4.7 4.0  CL 102 104 105 95* 98  CO2 21* 23 21* 20 24  GLUCOSE 124* 101* 116* 100* 105*  BUN 10 10 10 14 12   CALCIUM 8.3* 8.2* 8.5* 9.0 9.7  CREATININE 0.72 0.76 0.84 0.79 0.68  GFRNONAA >60 >60 >60  --  >60    LIVER FUNCTION TESTS: Recent Labs    06/01/24 0314 06/02/24 0326 06/03/24 0334 06/13/24 1132  BILITOT 0.6 0.5 0.3 0.4  AST 14* 13* 12* 20  ALT 10 10 12 31   ALKPHOS 110 110 114 194*  PROT 6.0* 5.7* 5.8* 7.4  ALBUMIN 2.5* 2.4* 2.3* 3.7    TUMOR MARKERS: No results for input(s): AFPTM, CEA, CA199, CHROMGRNA in the last 8760 hours.  Assessment and Plan:  73 y.o. male. Outpatient. 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 Cytology resulted in [facility] lymphoma. Team is requesting a portacath for chemoptherapy access   PLAN: IR Image Guide Portacath Placement  Risks and benefits of image guided port-a-catheter placement was discussed with the patient including, but not limited to bleeding, infection, pneumothorax, or fibrin sheath development and need for additional procedures.  All of the patient's questions were answered, patient is agreeable to proceed. Consent signed and in  chart.   Thank you for this interesting consult.  I greatly enjoyed meeting Tildon Silveria and look forward to participating in their care.  A copy of this report was sent to the requesting provider on this date.  Electronically Signed: Delon JAYSON Beagle, NP 06/24/2024, 5:01 PM   I spent a total of  30 Minutes   in face to face in clinical consultation, greater than 50% of which was counseling/coordinating care for portacath placement

## 2024-06-24 NOTE — Telephone Encounter (Signed)
 Completed [employer] FMLA paperwork sent to provider to review, make any needed amendments, sign and return to form staff.   Signed paperwork returned to this nurse.   Successfully returned via [phone].   No request for medical records.  Copy to [facility] HIM bin designated for items to be scanned to EMR.   Prepared paperwork for [facility]-file folder near registrar no.: for Pick up on next scheduled visit.  . Process completed with no further instructions received, actions performed or required by this nurse.

## 2024-06-25 ENCOUNTER — Other Ambulatory Visit: Payer: Self-pay

## 2024-06-25 ENCOUNTER — Inpatient Hospital Stay (HOSPITAL_COMMUNITY)
Admission: RE | Admit: 2024-06-25 | Discharge: 2024-06-25 | Disposition: A | Source: Ambulatory Visit | Attending: Hematology

## 2024-06-25 DIAGNOSIS — C8118 Nodular sclerosis classical Hodgkin lymphoma, lymph nodes of multiple sites: Secondary | ICD-10-CM | POA: Insufficient documentation

## 2024-06-25 DIAGNOSIS — Z85828 Personal history of other malignant neoplasm of skin: Secondary | ICD-10-CM | POA: Insufficient documentation

## 2024-06-25 DIAGNOSIS — Z7901 Long term (current) use of anticoagulants: Secondary | ICD-10-CM | POA: Insufficient documentation

## 2024-06-25 DIAGNOSIS — I4891 Unspecified atrial fibrillation: Secondary | ICD-10-CM | POA: Insufficient documentation

## 2024-06-25 HISTORY — PX: IR IMAGING GUIDED PORT INSERTION: IMG5740

## 2024-06-25 MED ORDER — HEPARIN SOD (PORK) LOCK FLUSH 100 UNIT/ML IV SOLN
500.0000 [IU] | Freq: Once | INTRAVENOUS | Status: AC
  Administered 2024-06-25: 500 [IU] via INTRAVENOUS

## 2024-06-25 MED ORDER — MIDAZOLAM HCL (PF) 2 MG/2ML IJ SOLN
INTRAMUSCULAR | Status: AC | PRN
  Administered 2024-06-25 (×2): 1 mg via INTRAVENOUS

## 2024-06-25 MED ORDER — LIDOCAINE HCL 1 % IJ SOLN
10.0000 mL | Freq: Once | INTRAMUSCULAR | Status: AC
  Administered 2024-06-25: 10 mL via INTRADERMAL

## 2024-06-25 MED ORDER — FENTANYL CITRATE (PF) 100 MCG/2ML IJ SOLN
INTRAMUSCULAR | Status: AC
  Filled 2024-06-25: qty 2

## 2024-06-25 MED ORDER — MIDAZOLAM HCL 2 MG/2ML IJ SOLN
INTRAMUSCULAR | Status: AC
  Filled 2024-06-25: qty 2

## 2024-06-25 MED ORDER — HEPARIN SOD (PORK) LOCK FLUSH 100 UNIT/ML IV SOLN
INTRAVENOUS | Status: AC
  Filled 2024-06-25: qty 5

## 2024-06-25 MED ORDER — LIDOCAINE-EPINEPHRINE 1 %-1:100000 IJ SOLN
20.0000 mL | Freq: Once | INTRAMUSCULAR | Status: AC
  Administered 2024-06-25: 20 mL via INTRADERMAL

## 2024-06-25 MED ORDER — LIDOCAINE-EPINEPHRINE 1 %-1:100000 IJ SOLN
INTRAMUSCULAR | Status: AC
  Filled 2024-06-25: qty 1

## 2024-06-25 MED ORDER — FENTANYL CITRATE (PF) 100 MCG/2ML IJ SOLN
INTRAMUSCULAR | Status: AC | PRN
  Administered 2024-06-25 (×3): 50 ug via INTRAVENOUS

## 2024-06-25 MED ORDER — LIDOCAINE HCL 1 % IJ SOLN
INTRAMUSCULAR | Status: AC
  Filled 2024-06-25: qty 20

## 2024-06-25 MED ORDER — SODIUM CHLORIDE 0.9 % IV SOLN: INTRAVENOUS | Status: DC

## 2024-06-25 NOTE — Procedures (Signed)
Interventional Radiology Procedure:   Indications: Hodgkin lymphoma  Procedure: Port placement  Findings: Right jugular port, tip at SVC/RA junction  Complications: None     EBL: Minimal, less than 10 ml  Plan: Discharge in one hour.  Keep port site and incisions dry for at least 24 hours.     Nadya Hopwood R. Keely Drennan, MD  Pager: [phone]   

## 2024-06-25 NOTE — Progress Notes (Addendum)
 Orders verified with NP Delon, orders revised as needed for port producer.

## 2024-06-27 ENCOUNTER — Inpatient Hospital Stay

## 2024-06-27 ENCOUNTER — Telehealth: Payer: Self-pay

## 2024-06-27 ENCOUNTER — Encounter: Payer: Self-pay | Admitting: Hematology

## 2024-06-27 ENCOUNTER — Inpatient Hospital Stay: Attending: Hematology

## 2024-06-27 VITALS — BP 130/66 | HR 93 | Temp 98.5°F | Resp 16 | Wt 168.0 lb

## 2024-06-27 DIAGNOSIS — D701 Agranulocytosis secondary to cancer chemotherapy: Secondary | ICD-10-CM | POA: Insufficient documentation

## 2024-06-27 DIAGNOSIS — I4891 Unspecified atrial fibrillation: Secondary | ICD-10-CM | POA: Insufficient documentation

## 2024-06-27 DIAGNOSIS — Z8549 Personal history of malignant neoplasm of other male genital organs: Secondary | ICD-10-CM | POA: Diagnosis not present

## 2024-06-27 DIAGNOSIS — Z5112 Encounter for antineoplastic immunotherapy: Secondary | ICD-10-CM | POA: Diagnosis present

## 2024-06-27 DIAGNOSIS — Z79899 Other long term (current) drug therapy: Secondary | ICD-10-CM | POA: Diagnosis not present

## 2024-06-27 DIAGNOSIS — Z79620 Long term (current) use of immunosuppressive biologic: Secondary | ICD-10-CM | POA: Diagnosis not present

## 2024-06-27 DIAGNOSIS — T451X5A Adverse effect of antineoplastic and immunosuppressive drugs, initial encounter: Secondary | ICD-10-CM | POA: Insufficient documentation

## 2024-06-27 DIAGNOSIS — C8118 Nodular sclerosis classical Hodgkin lymphoma, lymph nodes of multiple sites: Secondary | ICD-10-CM | POA: Insufficient documentation

## 2024-06-27 DIAGNOSIS — Z7901 Long term (current) use of anticoagulants: Secondary | ICD-10-CM | POA: Diagnosis not present

## 2024-06-27 DIAGNOSIS — I341 Nonrheumatic mitral (valve) prolapse: Secondary | ICD-10-CM | POA: Diagnosis not present

## 2024-06-27 DIAGNOSIS — Z5111 Encounter for antineoplastic chemotherapy: Secondary | ICD-10-CM | POA: Insufficient documentation

## 2024-06-27 DIAGNOSIS — L089 Local infection of the skin and subcutaneous tissue, unspecified: Secondary | ICD-10-CM | POA: Insufficient documentation

## 2024-06-27 DIAGNOSIS — Z5189 Encounter for other specified aftercare: Secondary | ICD-10-CM | POA: Diagnosis not present

## 2024-06-27 LAB — CMP (CANCER CENTER ONLY)
ALT: 34 U/L (ref 0–44)
AST: 26 U/L (ref 15–41)
Albumin: 3.5 g/dL (ref 3.5–5.0)
Alkaline Phosphatase: 196 U/L — ABNORMAL HIGH (ref 38–126)
Anion gap: 9 (ref 5–15)
BUN: 11 mg/dL (ref 8–23)
CO2: 28 mmol/L (ref 22–32)
Calcium: 10.9 mg/dL — ABNORMAL HIGH (ref 8.9–10.3)
Chloride: 97 mmol/L — ABNORMAL LOW (ref 98–111)
Creatinine: 0.71 mg/dL (ref 0.61–1.24)
GFR, Estimated: >60 mL/min (ref >60)
Glucose, Bld: 111 mg/dL — ABNORMAL HIGH (ref 70–99)
Potassium: 4.1 mmol/L (ref 3.5–5.1)
Sodium: 134 mmol/L — ABNORMAL LOW (ref 135–145)
Total Bilirubin: 0.4 mg/dL (ref 0.0–1.2)
Total Protein: 7 g/dL (ref 6.5–8.1)

## 2024-06-27 LAB — CBC WITH DIFFERENTIAL (CANCER CENTER ONLY)
Abs Immature Granulocytes: 0.06 K/uL (ref 0.00–0.07)
Basophils Absolute: 0 K/uL (ref 0.0–0.1)
Basophils Relative: 0 %
Eosinophils Absolute: 0.1 K/uL (ref 0.0–0.5)
Eosinophils Relative: 1 %
HCT: 28.5 % — ABNORMAL LOW (ref 39.0–52.0)
Hemoglobin: 9.3 g/dL — ABNORMAL LOW (ref 13.0–17.0)
Immature Granulocytes: 1 %
Lymphocytes Relative: 25 %
Lymphs Abs: 2.5 K/uL (ref 0.7–4.0)
MCH: 27 pg (ref 26.0–34.0)
MCHC: 32.6 g/dL (ref 30.0–36.0)
MCV: 82.8 fL (ref 80.0–100.0)
Monocytes Absolute: 1 K/uL (ref 0.1–1.0)
Monocytes Relative: 10 %
Neutro Abs: 6.3 K/uL (ref 1.7–7.7)
Neutrophils Relative %: 63 %
Platelet Count: 459 K/uL — ABNORMAL HIGH (ref 150–400)
RBC: 3.44 MIL/uL — ABNORMAL LOW (ref 4.22–5.81)
RDW: 16.2 % — ABNORMAL HIGH (ref 11.5–15.5)
WBC Count: 9.9 K/uL (ref 4.0–10.5)
nRBC: 0 % (ref 0.0–0.2)

## 2024-06-27 LAB — TSH: TSH: 4.64 u[IU]/mL — ABNORMAL HIGH (ref 0.350–4.500)

## 2024-06-27 MED ORDER — SODIUM CHLORIDE 0.9 % IV SOLN: INTRAVENOUS | Status: DC

## 2024-06-27 MED ORDER — SODIUM CHLORIDE 0.9 % IV SOLN
240.0000 mg | Freq: Once | INTRAVENOUS | Status: AC
  Administered 2024-06-27: 240 mg via INTRAVENOUS
  Filled 2024-06-27: qty 24

## 2024-06-27 MED ORDER — PALONOSETRON HCL INJECTION 0.25 MG/5ML
0.2500 mg | Freq: Once | INTRAVENOUS | Status: AC
  Administered 2024-06-27: 0.25 mg via INTRAVENOUS
  Filled 2024-06-27: qty 5

## 2024-06-27 MED ORDER — SODIUM CHLORIDE 0.9% FLUSH: 10.0000 mL | INTRAVENOUS | Status: DC | PRN

## 2024-06-27 MED ORDER — DOXORUBICIN HCL CHEMO IV INJECTION 2 MG/ML
25.0000 mg/m2 | Freq: Once | INTRAVENOUS | Status: AC
  Administered 2024-06-27: 50 mg via INTRAVENOUS
  Filled 2024-06-27: qty 25

## 2024-06-27 MED ORDER — DEXAMETHASONE SOD PHOSPHATE PF 10 MG/ML IJ SOLN
10.0000 mg | Freq: Once | INTRAMUSCULAR | Status: AC
  Administered 2024-06-27: 10 mg via INTRAVENOUS

## 2024-06-27 MED ORDER — VINBLASTINE SULFATE CHEMO INJECTION 1 MG/ML
6.0000 mg/m2 | Freq: Once | INTRAVENOUS | Status: AC
  Administered 2024-06-27: 12.2 mg via INTRAVENOUS
  Filled 2024-06-27: qty 12.2

## 2024-06-27 MED ORDER — APREPITANT 130 MG/18ML IV EMUL
130.0000 mg | Freq: Once | INTRAVENOUS | Status: AC
  Administered 2024-06-27: 130 mg via INTRAVENOUS
  Filled 2024-06-27: qty 18

## 2024-06-27 MED ORDER — SODIUM CHLORIDE 0.9 % IV SOLN
375.0000 mg/m2 | Freq: Once | INTRAVENOUS | Status: AC
  Administered 2024-06-27: 800 mg via INTRAVENOUS
  Filled 2024-06-27: qty 80

## 2024-06-27 NOTE — Telephone Encounter (Signed)
 [facility] CSW Progress Note  Clinical Child Psychotherapist received message that patient is interested in caregiver counseling. CSW called on this date to discuss further. Patient unable to speak at this time, scheduled to speak on 12/10 to speak further  Lizbeth Sprague, LCSW Clinical Social Worker [facility]

## 2024-06-27 NOTE — Patient Instructions (Signed)
 [facility] - A DEPT OF [facility]  Discharge Instructions: Thank you for choosing [facility] to provide your oncology and hematology care.   If you have a lab appointment with the Cancer Center, please go directly to the Cancer Center and check in at the registration area.   Wear comfortable clothing and clothing appropriate for easy access to any Portacath or PICC line.   We strive to give you quality time with your provider. You may need to reschedule your appointment if you arrive late (15 or more minutes).  Arriving late affects you and other patients whose appointments are after yours.  Also, if you miss three or more appointments without notifying the office, you may be dismissed from the clinic at the provider's discretion.      For prescription refill requests, have your pharmacy contact our office and allow 72 hours for refills to be completed.    Today you received the following chemotherapy and/or immunotherapy agents doxorubicin, velban, DTIC, opdivo       To help prevent nausea and vomiting after your treatment, we encourage you to take your nausea medication as directed.  BELOW ARE SYMPTOMS THAT SHOULD BE REPORTED IMMEDIATELY: *FEVER GREATER THAN 100.4 F (38 C) OR HIGHER *CHILLS OR SWEATING *NAUSEA AND VOMITING THAT IS NOT CONTROLLED WITH YOUR NAUSEA MEDICATION *UNUSUAL SHORTNESS OF BREATH *UNUSUAL BRUISING OR BLEEDING *URINARY PROBLEMS (pain or burning when urinating, or frequent urination) *BOWEL PROBLEMS (unusual diarrhea, constipation, pain near the anus) TENDERNESS IN MOUTH AND THROAT WITH OR WITHOUT PRESENCE OF ULCERS (sore throat, sores in mouth, or a toothache) UNUSUAL RASH, SWELLING OR PAIN  UNUSUAL VAGINAL DISCHARGE OR ITCHING   Items with * indicate a potential emergency and should be followed up as soon as possible or go to the Emergency Department if any problems should occur.  Please show the CHEMOTHERAPY  ALERT CARD or IMMUNOTHERAPY ALERT CARD at check-in to the Emergency Department and triage nurse.  Should you have questions after your visit or need to cancel or reschedule your appointment, please contact [facility] - A DEPT OF [facility]  Dept: [phone]  and follow the prompts.  Office hours are 8:00 a.m. to 4:30 p.m. Monday - Friday. Please note that voicemails left after 4:00 p.m. may not be returned until the following business day.  We are closed weekends and major holidays. You have access to a nurse at all times for urgent questions. Please call the main number to the clinic Dept: [phone] and follow the prompts.   For any non-urgent questions, you may also contact your provider using MyChart. We now offer e-Visits for anyone 18 and older to request care online for non-urgent symptoms. For details visit [email or web address].   Also download the MyChart app! Go to the app store, search MyChart, open the app, select [facility], and log in with your MyChart username and password.

## 2024-06-28 ENCOUNTER — Other Ambulatory Visit: Payer: Self-pay | Admitting: Hematology

## 2024-06-28 ENCOUNTER — Telehealth: Payer: Self-pay

## 2024-06-28 ENCOUNTER — Encounter: Payer: Self-pay | Admitting: Hematology

## 2024-06-28 DIAGNOSIS — C8118 Nodular sclerosis classical Hodgkin lymphoma, lymph nodes of multiple sites: Secondary | ICD-10-CM

## 2024-06-28 LAB — T4: T4, Total: 7.1 ug/dL (ref 4.5–12.0)

## 2024-06-28 NOTE — Telephone Encounter (Signed)
LM for patient that this nurse was calling to see how they were doing after their treatment. Please call back to Dr. Kale's nurse at [phone] if they have any questions or concerns regarding the treatment. 

## 2024-06-28 NOTE — Telephone Encounter (Signed)
-----   Message from Nurse Abigail S sent at 06/27/2024  5:05 PM EST ----- Regarding: First time doxorubicin , dtic, opdivo , and velban  First time doxorubicin , dtic, opdivo , and velban   Dr. Onesimo pt Tolerated well

## 2024-07-01 ENCOUNTER — Other Ambulatory Visit (HOSPITAL_BASED_OUTPATIENT_CLINIC_OR_DEPARTMENT_OTHER): Payer: Self-pay

## 2024-07-02 ENCOUNTER — Other Ambulatory Visit: Payer: Self-pay

## 2024-07-03 ENCOUNTER — Inpatient Hospital Stay

## 2024-07-04 ENCOUNTER — Inpatient Hospital Stay

## 2024-07-05 ENCOUNTER — Telehealth: Payer: Self-pay | Admitting: *Deleted

## 2024-07-05 NOTE — Telephone Encounter (Signed)
 David [name]'s daughter David [name] [phone]) called .  FMLA performed by this nurse not accepted.  Question number 4c should match Question 7b.  The start date of the leave should be 05/28/2024.   Denied further questions or needs.  Previous form amended to refax.

## 2024-07-08 ENCOUNTER — Other Ambulatory Visit: Payer: Self-pay

## 2024-07-09 ENCOUNTER — Ambulatory Visit

## 2024-07-10 ENCOUNTER — Telehealth: Payer: Self-pay

## 2024-07-10 NOTE — Telephone Encounter (Signed)
 Return the pt daughter Terrilyn) phone call regarding her FMLA forms, needing correction. I was able to make that change as requested, my director Dwan Henry PEAK )is aware of the change.

## 2024-07-11 NOTE — Progress Notes (Addendum)
 "   HEMATOLOGY/ONCOLOGY CONSULTATION NOTE  Date of Service: 07/12/2024  Patient Care Team: Health, [facility] as PCP - General  CHIEF COMPLAINTS/PURPOSE OF CONSULTATION:  Hodgkin's Lymphoma  INTERVAL HISTORY:   David [name] is a wonderful 73 y.o. male presents for a follow up for Hodgkin's lymphoma due for cycle 1 day 15 of Nivo-AVD. Patient is accompanied by her daughter for this visit.   David [name] reports he tolerated his first treatment without any significant side effects.  He shares that he has noticed an improvement of his energy and appetite.  He denies nausea, vomiting or any bowel habit changes.  He recovered from the flu and strep throat last week.  He completed course of Tamiflu and doxycycline.  He has noticed that he is a little bit off balance when he ambulates.  He suspects this is due to pain more sedentary and is trying to walk more around the house.  He noticed after his chemotherapy a painful wound around his upper back below his cervical surgical site.  He denies any injury to that area.  He denies fevers, chills, night sweats, shortness of breath, chest pain or cough.  He has no other complaints.  10 point ROS as below.  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   Chicken pox    History of skin cancer    Polyp of colon     SURGICAL HISTORY: Past Surgical History:  Procedure Laterality Date   APPENDECTOMY     CIRCUMCISION     at age 73 yrs old   COLONOSCOPY     FORAMINOTOMY, SPINE, CERVICAL, 1 LEVEL N/A 04/19/2024   Procedure: CERVICAL FIVE- SIX-SEVEN LAMINECTOMY FOR EPIDURAL ABSCESS;  Surgeon: Louis Shove, MD;  Location: [facility] OR;  Service: Neurosurgery;  Laterality: N/A;  CERVICAL FIVE- SIX-SEVEN LAMINECTOMY  FOR EPIDURAL ABSCESS   INTERCOSTAL NERVE BLOCK Left 05/30/2024   Procedure: BLOCK, NERVE, INTERCOSTAL;  Surgeon: Kerrin Elspeth BROCKS, MD;  Location: [facility] OR;  Service: Thoracic;   Laterality: Left;   IR IMAGING GUIDED PORT INSERTION  06/25/2024   penile biopsy  03/01/2022   VIDEO ASSISTED THORACOSCOPY (VATS)/ LOBECTOMY N/A 05/30/2024   Procedure: VIDEO ASSISTED THORACOSCOPY (VATS)/ LOBECTOMY;  Surgeon: Kerrin Elspeth BROCKS, MD;  Location: [facility] OR;  Service: Thoracic;  Laterality: N/A;  [place] OR VATS FOR BIOPSY OF MEDIASTINAL MASS    SOCIAL HISTORY: Social History   Socioeconomic History   Marital status: Married    Spouse name: Not on file   Number of children: Not on file   Years of education: Not on file   Highest education level: Not on file  Occupational History   Not on file  Tobacco Use   Smoking status: Never   Smokeless tobacco: Never  Vaping Use   Vaping status: Never Used  Substance and Sexual Activity   Alcohol use: Yes    Comment: occasionally   Drug use: No   Sexual activity: Not on file  Other Topics Concern   Not on file  Social History Narrative   Not on file   Social Drivers of Health   Tobacco Use: Low Risk (06/12/2024)   Patient History    Smoking Tobacco Use: Never    Smokeless Tobacco Use: Never    Passive Exposure: Not on file  Financial Resource Strain: Not on file  Food Insecurity: No Food Insecurity (06/13/2024)   Epic    Worried About  Running Out of Food in the Last Year: Never true    Ran Out of Food in the Last Year: Never true  Transportation Needs: No Transportation Needs (06/13/2024)   Epic    Lack of Transportation (Medical): No    Lack of Transportation (Non-Medical): No  Physical Activity: Not on file  Stress: Not on file  Social Connections: Moderately Integrated (05/28/2024)   Social Connection and Isolation Panel    Frequency of Communication with Friends and Family: More than three times a week    Frequency of Social Gatherings with Friends and Family: More than three times a week    Attends Religious Services: More than 4 times per year    Active Member of [employer] or Organizations: Yes    Attends  Banker Meetings: More than 4 times per year    Marital Status: Divorced  Intimate Partner Violence: Not At Risk (05/28/2024)   Epic    Fear of Current or Ex-Partner: No    Emotionally Abused: No    Physically Abused: No    Sexually Abused: No  Depression (PHQ2-9): Not on file  Alcohol Screen: Not on file  Housing: Low Risk (06/13/2024)   Epic    Unable to Pay for Housing in the Last Year: No    Number of Times Moved in the Last Year: 0    Homeless in the Last Year: No  Utilities: Not At Risk (06/13/2024)   Epic    Threatened with loss of utilities: No  Health Literacy: Not on file   Social History   Social History Narrative   Not on file    FAMILY HISTORY: Family History  Problem Relation Age of Onset   Arthritis Mother    Diabetes Mother    Heart attack Father    Heart attack Brother    Stroke Brother    Hypertension Brother    Hypertension Brother     ALLERGIES:  is allergic to bee venom and honey bee venom.  MEDICATIONS:  Current Outpatient Medications  Medication Sig Dispense Refill   acetaminophen  (TYLENOL ) 500 MG tablet Take 1,000 mg by mouth See admin instructions. Take 2 tablets (1000mg ) by mouth every evening (with tramadol ) if needed for pain.     amoxicillin -clavulanate (AUGMENTIN ) 875-125 MG tablet Take 1 tablet by mouth 2 (two) times daily. 14 tablet 0   apixaban  (ELIQUIS ) 5 MG TABS tablet Take 1 tablet (5 mg total) by mouth 2 (two) times daily. 60 tablet 0   Cholecalciferol (VITAMIN D -3 PO) Take 1 capsule by mouth daily.     Cod Liver Oil CAPS Take 1 capsule by mouth every evening.     Coenzyme Q10 (COQ10 PO) Take 1 capsule by mouth daily.     Cyanocobalamin (VITAMIN B-12 PO) Take 1 tablet by mouth daily.     dexamethasone  (DECADRON ) 4 MG tablet Take 2 tablets (8 mg) by mouth daily for 3 days starting the day after chemotherapy. Take with food. 30 tablet 1   fluorouracil (EFUDEX) 5 % cream Apply 1 application  topically 2 (two) times daily  as needed (skin spots).     GARLIC PO Take 1 tablet by mouth every evening.     Ginger, Zingiber officinalis, (GINGER ROOT PO) Take 1 tablet by mouth 2 (two) times daily.     L-GLUTAMINE PO Take 1 tablet by mouth 2 (two) times daily.     lidocaine -prilocaine  (EMLA ) cream Apply to affected area once 30 g 3   linezolid  (ZYVOX )  600 MG tablet Take 1 tablet (600 mg total) by mouth 2 (two) times daily. 14 tablet 0   metoprolol  succinate (TOPROL -XL) 100 MG 24 hr tablet Take 1 tablet (100 mg total) by mouth daily. Take with or immediately following a meal. 30 tablet 0   mirtazapine  (REMERON ) 15 MG tablet Take 1 tablet (15 mg total) by mouth at bedtime. 30 tablet 0   Multiple Vitamins-Minerals (MENS 50+ MULTIVITAMIN) TABS Take 1 tablet by mouth daily.     ondansetron  (ZOFRAN ) 8 MG tablet Take 1 tablet (8 mg total) by mouth every 8 (eight) hours as needed for nausea or vomiting. Start on the third day after chemotherapy. 30 tablet 1   pantoprazole  (PROTONIX ) 40 MG tablet Take 1 tablet (40 mg total) by mouth daily. 30 tablet 0   polyethylene glycol powder (GLYCOLAX /MIRALAX ) 17 GM/SCOOP powder Take 17 g by mouth 2 (two) times daily. Dissolve 1 capful (17g) in 4-8 ounces of liquid and take by mouth daily. 238 g 0   Probiotic Product (PROBIOTIC PO) Take 1 capsule by mouth daily.     prochlorperazine  (COMPAZINE ) 10 MG tablet Take 1 tablet (10 mg total) by mouth every 6 (six) hours as needed for nausea or vomiting. 30 tablet 1   sennosides-docusate sodium  (SENOKOT-S) 8.6-50 MG tablet Take 1 tablet by mouth every evening.     tiZANidine  (ZANAFLEX ) 4 MG capsule Take 4 mg by mouth See admin instructions. Take 1 tablet (4mg ) by mouth every evening if needed for pain, muscle spasms.     traMADol  (ULTRAM ) 50 MG tablet Take 50 mg by mouth See admin instructions. Take 1 tablet (50mg ) by mouth every evening if needed for pain.     tamsulosin  (FLOMAX ) 0.4 MG CAPS capsule Take 1 capsule (0.4 mg total) by mouth daily. (Patient  not taking: Reported on 07/12/2024) 30 capsule 0   No current facility-administered medications for this visit.   Facility-Administered Medications Ordered in Other Visits  Medication Dose Route Frequency Provider Last Rate Last Admin   0.9 %  sodium chloride  infusion   Intravenous Continuous Kale, Gautam Kishore, MD   Stopped at 07/12/24 1358   sodium chloride  flush (NS) 0.9 % injection 10 mL  10 mL Intracatheter PRN Onesimo Emaline Brink, MD        REVIEW OF SYSTEMS:    10 Point review of Systems was done is negative except as noted above.  PHYSICAL EXAMINATION: ECOG PERFORMANCE STATUS: 1 - Symptomatic but completely ambulatory  Vitals:   07/12/24 1109  BP: 127/65  Pulse: 79  Resp: 16  Temp: (!) 97.2 F (36.2 C)  SpO2: 99%    Filed Weights   07/12/24 1109  Weight: 164 lb 9 oz (74.6 kg)    Body mass index is 22.32 kg/m.  GENERAL: alert, in no acute distress and comfortable SKIN: no acute rashes, no significant lesions EYES: conjunctiva are pink and non-injected, sclera anicteric OROPHARYNX: MMM, no exudates, no oropharyngeal erythema or ulceration NECK: supple, no JVD LUNGS: clear to auscultation b/l with normal respiratory effort HEART: regular rate & rhythm Extremity: no pedal edema, MSK: Scabbed lesion with surrounding erythema with some fluctuance  PSYCH: alert & oriented x 3 with fluent speech NEURO: no focal motor/sensory deficits  LABORATORY DATA:  I have reviewed the data as listed     Latest Ref Rng & Units 07/12/2024   10:27 AM 06/27/2024   11:57 AM 06/13/2024   11:32 AM  CBC  WBC 4.0 - 10.5 K/uL 4.2  9.9  9.2   Hemoglobin 13.0 - 17.0 g/dL 9.4  9.3  9.4   Hematocrit 39.0 - 52.0 % 29.5  28.5  28.7   Platelets 150 - 400 K/uL 300  459  367        Latest Ref Rng & Units 07/12/2024   10:27 AM 06/27/2024   11:57 AM 06/13/2024   11:32 AM  CMP  Glucose 70 - 99 mg/dL 876  888  894   BUN 8 - 23 mg/dL 11  11  12    Creatinine 0.61 - 1.24 mg/dL 9.42   9.28  9.31   Sodium 135 - 145 mmol/L 137  134  136   Potassium 3.5 - 5.1 mmol/L 4.1  4.1  4.0   Chloride 98 - 111 mmol/L 102  97  98   CO2 22 - 32 mmol/L 27  28  24    Calcium 8.9 - 10.3 mg/dL 9.4  89.0  9.7   Total Protein 6.5 - 8.1 g/dL 6.7  7.0  7.4   Total Bilirubin 0.0 - 1.2 mg/dL 0.2  0.4  0.4   Alkaline Phos 38 - 126 U/L 123  196  194   AST 15 - 41 U/L 19  26  20    ALT 0 - 44 U/L 21  34  31    SURGICAL PATHOLOGY CASE: [id number] PATIENT: David [name] Surgical Pathology Report     Clinical History: mediastinal mass (cm)     FINAL MICROSCOPIC DIAGNOSIS:  A. MEDIASTINAL MASS, ANTERIOR, EXCISION: -  Classical Hodgkin's lymphoma, nodular sclerosis subtype, see part B.  B. MEDIASTINAL MASS, ANTERIOR, EXCISION: -  Classical Hodgkin's lymphoma, nodular sclerosis subtype, see note.  Note: The specimen consists of abundant dense fibrosis/sclerosis with small nodules of lymphoid tissue that contains scattered large atypical cells with irregular nuclear contours and prominent nucleoli (i.e. Hodgkin's type cells), some bilobed/multinucleated forms (i.e. Reed-Sternberg type cells), as well as some apoptotic/mummified type cells.  The cells of interest are strongly positive for CD30 with Golgi zone staining.  They show loss of CD20 and CD45, and are positive for PAX5 (weakly) as well as MUM1.  BCL6 and CD15 are negative.  EBV ISH is negative.  CD3 highlights background small T cells.   INTRAOPERATIVE DIAGNOSIS:  A.  Mediastinal mass, anterior, excision: Sclerotic stroma with admixed lymphoepithelial nests.  Rule out thymoma. Intraoperative diagnosis rendered by Dr. Reed at 5:07 PM on 05/30/2024.  GROSS DESCRIPTION:  A.  Received fresh for frozen section is a 2.5 x 1.7 x 0.7 cm fragment of tan-yellow fibrofatty tissue.  A touch prep is made.  A representative section is submitted for frozen section as A1.  The remainder the specimen is entirely submitted in  A2-3.  B.  Received fresh are 2 fragments of tan-pink fibrofatty tissue measuring 2.8 x 2.5 x 2.2 cm in aggregate.  Sectioning reveals a tan-pink fibrous cut surface intermixed with yellow lobulated adipose. Representative sections are submitted in B1-3. (WC 05/31/2024)      RADIOGRAPHIC STUDIES: I have personally reviewed the radiological images as listed and agreed with the findings in the report. NM PET Image Initial (PI) Skull Base To Thigh Result Date: 06/28/2024 EXAM: PET AND CT SKULL BASE TO MID THIGH 06/24/2024 06:11:50 PM TECHNIQUE: RADIOPHARMACEUTICAL: 8.9 mCi F-18 FDG Uptake time 60 minutes. Glucose level 110 mg/dl. PET imaging was acquired from the base of the skull to the mid thighs. Non-contrast enhanced computed tomography was obtained for attenuation correction and anatomic localization. COMPARISON:  Chest, abdomen, and pelvic CTs of 05/28/2024. CLINICAL HISTORY: Hematologic malignancy, staging; Initial staging of newly diagnosed Hodgkin's lymphoma. FINDINGS: LIMITATIONS/ARTIFACTS: Motion degradation between the PET and CT images involves the neck and upper chest. HEAD AND NECK: Posterior element and soft tissue hypermetabolism within the lower cervical spine corresponds to sites of recent laminectomy and is likely postoperative. Low right cervical hypermetabolic nodes, example of 1.5 cm with an SUV of 10.4 on image 41/4. CHEST: Extensive thoracic nodal hypermetabolism. Example anterior mediastinal mass of 4.7 x 7.8 cm with an SUV of 14.7. Small, left greater than right pleural effusions are new since the prior diagnostic CT. Aortic and coronary artery atherosclerosis. ABDOMEN AND PELVIS: Hypermetabolic nodes in the porta hepatis. Left paraaortic node of 2.2 cm with an SUV of 13.7 on image 139/4. Left external iliac index node of 2.2 cm with an SUV of 20.6 on image 185/4. There is no hypermetabolic activity within the liver (liver SUV 2.5), adrenal glands, spleen or pancreas. Stone in the  gallbladder fundus of 2.5 cm. Normal adrenal glands. Mild prostatomegaly. Physiologic activity within the gastrointestinal and genitourinary systems. BONES AND SOFT TISSUE: Relatively diffuse marrow hypermetabolism, consistent with lymphomatous involvement. Example within a transitional S1 vertebral body with an SUV of 15.3. Old right rib fractures. General: Blood pool SUV 2.4. IMPRESSION: 1. Mild motion degradation involving the neck and upper chest. 2. Active lymphoma within the neck, chest, abdomen, pelvis, and marrow space, as detailed above. Deauville 5. 3. New small, left greater than right pleural effusions. 4. Incidental findings, including aortic atherosclerosis (ICD10 I70.0), coronary artery atherosclerosis, cholelithiasis, and prostatomegaly. Electronically signed by: Rockey Kilts MD 06/28/2024 03:15 PM EST RP Workstation: [id number]   IR IMAGING GUIDED PORT INSERTION Result Date: 06/25/2024 INDICATION: Port-A-Cath needed for treatment of Hodgkin's lymphoma. EXAM: FLUOROSCOPIC AND ULTRASOUND GUIDED PLACEMENT OF A SUBCUTANEOUS PORT MEDICATIONS: Moderate sedation ANESTHESIA/SEDATION: Moderate (conscious) sedation was employed during this procedure. A total of Versed  2 mg and fentanyl  150 mcg was administered intravenously at the order of the provider performing the procedure. Total intra-service moderate sedation time: 35 minutes. Patient's level of consciousness and vital signs were monitored continuously by radiology nurse throughout the procedure under the supervision of the provider performing the procedure. FLUOROSCOPY TIME:  Radiation Exposure Index (as provided by the fluoroscopic device): 1 mGy Kerma COMPLICATIONS: None immediate. PROCEDURE: The procedure, risks, benefits, and alternatives were explained to the patient. Questions regarding the procedure were encouraged and answered. The patient understands and consents to the procedure. Patient was placed supine on the interventional table.  Ultrasound confirmed a patent right internal jugular vein. Ultrasound image was saved for documentation. The right chest and neck were cleaned with a skin antiseptic and a sterile drape was placed. Maximal barrier sterile technique was utilized including caps, mask, sterile gowns, sterile gloves, sterile drape, hand hygiene and skin antiseptic. The right neck was anesthetized with 1% lidocaine . Small incision was made in the right neck with a blade. Micropuncture set was placed in the right internal jugular vein with ultrasound guidance. The micropuncture wire was used for measurement purposes. The right chest was anesthetized with 1% lidocaine  with epinephrine . #15 blade was used to make an incision and a subcutaneous port pocket was formed. 8 french Power Port was assembled. Subcutaneous tunnel was formed with a stiff tunneling device. The port catheter was brought through the subcutaneous tunnel. The port was placed in the subcutaneous pocket. The micropuncture set was exchanged for a peel-away sheath. The catheter was placed through the  peel-away sheath and the tip was positioned at the superior cavoatrial junction. Catheter placement was confirmed with fluoroscopy. Initially, the port catheter was kinked near the jugular access site. The catheter was slightly retracted into the chest pocket and the kink resolved. The port was accessed and flushed with heparinized saline. The port pocket was closed using two layers of absorbable sutures and Dermabond. The vein skin site was closed using a single layer of absorbable suture and Dermabond. Sterile dressings were applied. Patient tolerated the procedure well without an immediate complication. Ultrasound and fluoroscopic images were taken and saved for this procedure. IMPRESSION: Placement of a subcutaneous power-injectable port device. Catheter tip at the superior cavoatrial junction. Electronically Signed   By: Juliene Balder M.D.   On: 06/25/2024 15:53     ASSESSMENT & PLAN:  David [name] is a 73 y.o. 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 Enlarged mediastinal lymph nodes, including high paratracheal and right hilar nodes. Subcentimeter thyroid  nodules and a 15 mm peripherally calcified thyroid  isthmus nodule. Indeterminate sclerosis at the lateral left fifth rib. --Excision of anterior mediastinal mass confirmed classical hodgkin's lymphoma, nodular sclerosis subtype. --Staging PET scan from 06/24/2024 showed lymphoma with in the neck, chest, abdomen, pelvis and marrow space. Deauville 5.  --Recommended treatment includes Nivo-AVD for up to 6 cycles. Started Cycle 1, Day 1 on 06/27/2024 PLAN: --Due for Cycle 1, Day 15 of Nivo-AVD today --Labs from today were reviewed with patient and daughter.  WBC 4.2, hemoglobin 9.4, platelets 300K, ANC 600, creatinine and LFTs are adequate. --Due to concern for cellulitis and neutropenia, we will only proceed with nivolumab  therapy today and hold chemotherapy.  Patient will receive G-CSF injection today to improve neutropenia and with every treatment moving forward. --RTC in 2 weeks with labs and follow up prior to Cycle 2, Day 1.   #Concern for cellulitis involving the cervical spine: --He is uncertain the cause of the wound including trauma. --Discussed with infectious disease provider who recommended linezolid  600 mg BID PO and Augmentin  875/125 mg BID for 7 days  --Will order CT of the cervical and thoracic spine to evaluate depth of infection. --Will notify his neurosurgical team (Dr. Louis) for awareness and  any additional recommendations.  Orders Placed This Encounter  Procedures   CT CERVICAL SPINE W CONTRAST   CT THORACIC SPINE W CONTRAST      I have spent a total of 30 minutes minutes of face-to-face and non-face-to-face time, preparing to see the patient, obtaining and/or reviewing separately obtained history, performing a medically appropriate examination, counseling and educating the patient, ordering medications/tests/procedures, referring and communicating with other health care professionals, documenting clinical information in the electronic health record, independently interpreting results and communicating results to the patient, and care coordination.   Johnston Police PA-C Dept of Hematology and Oncology [facility] Phone: [phone]   ADDENDUM  .Patient was Personally and independently interviewed, examined and relevant elements of the history of present illness were reviewed in details and an assessment and plan was created. All elements of the patient's history of present illness , assessment and plan were discussed in details with  Johnston Police PA The above documentation reflects our combined findings assessment and plan.   Emaline Saran MD MS  "

## 2024-07-12 ENCOUNTER — Inpatient Hospital Stay

## 2024-07-12 ENCOUNTER — Inpatient Hospital Stay: Admitting: Physician Assistant

## 2024-07-12 VITALS — BP 127/65 | HR 79 | Temp 97.2°F | Resp 16 | Wt 164.6 lb

## 2024-07-12 DIAGNOSIS — C8118 Nodular sclerosis classical Hodgkin lymphoma, lymph nodes of multiple sites: Secondary | ICD-10-CM

## 2024-07-12 DIAGNOSIS — L089 Local infection of the skin and subcutaneous tissue, unspecified: Secondary | ICD-10-CM

## 2024-07-12 DIAGNOSIS — D702 Other drug-induced agranulocytosis: Secondary | ICD-10-CM | POA: Diagnosis not present

## 2024-07-12 DIAGNOSIS — D701 Agranulocytosis secondary to cancer chemotherapy: Secondary | ICD-10-CM | POA: Diagnosis not present

## 2024-07-12 DIAGNOSIS — Z5111 Encounter for antineoplastic chemotherapy: Secondary | ICD-10-CM | POA: Diagnosis not present

## 2024-07-12 DIAGNOSIS — Z5112 Encounter for antineoplastic immunotherapy: Secondary | ICD-10-CM | POA: Diagnosis not present

## 2024-07-12 DIAGNOSIS — T451X5A Adverse effect of antineoplastic and immunosuppressive drugs, initial encounter: Secondary | ICD-10-CM | POA: Diagnosis not present

## 2024-07-12 LAB — CBC WITH DIFFERENTIAL (CANCER CENTER ONLY)
Abs Immature Granulocytes: 0.1 K/uL — ABNORMAL HIGH (ref 0.00–0.07)
Basophils Absolute: 0 K/uL (ref 0.0–0.1)
Basophils Relative: 1 %
Eosinophils Absolute: 0.1 K/uL (ref 0.0–0.5)
Eosinophils Relative: 3 %
HCT: 29.5 % — ABNORMAL LOW (ref 39.0–52.0)
Hemoglobin: 9.4 g/dL — ABNORMAL LOW (ref 13.0–17.0)
Immature Granulocytes: 2 %
Lymphocytes Relative: 66 %
Lymphs Abs: 2.7 K/uL (ref 0.7–4.0)
MCH: 26.2 pg (ref 26.0–34.0)
MCHC: 31.9 g/dL (ref 30.0–36.0)
MCV: 82.2 fL (ref 80.0–100.0)
Monocytes Absolute: 0.6 K/uL (ref 0.1–1.0)
Monocytes Relative: 14 %
Neutro Abs: 0.6 K/uL — ABNORMAL LOW (ref 1.7–7.7)
Neutrophils Relative %: 14 %
Platelet Count: 300 K/uL (ref 150–400)
RBC: 3.59 MIL/uL — ABNORMAL LOW (ref 4.22–5.81)
RDW: 16.7 % — ABNORMAL HIGH (ref 11.5–15.5)
WBC Count: 4.2 K/uL (ref 4.0–10.5)
nRBC: 0 % (ref 0.0–0.2)

## 2024-07-12 LAB — CMP (CANCER CENTER ONLY)
ALT: 21 U/L (ref 0–44)
AST: 19 U/L (ref 15–41)
Albumin: 3.7 g/dL (ref 3.5–5.0)
Alkaline Phosphatase: 123 U/L (ref 38–126)
Anion gap: 9 (ref 5–15)
BUN: 11 mg/dL (ref 8–23)
CO2: 27 mmol/L (ref 22–32)
Calcium: 9.4 mg/dL (ref 8.9–10.3)
Chloride: 102 mmol/L (ref 98–111)
Creatinine: 0.57 mg/dL — ABNORMAL LOW (ref 0.61–1.24)
GFR, Estimated: >60 mL/min
Glucose, Bld: 123 mg/dL — ABNORMAL HIGH (ref 70–99)
Potassium: 4.1 mmol/L (ref 3.5–5.1)
Sodium: 137 mmol/L (ref 135–145)
Total Bilirubin: 0.2 mg/dL (ref 0.0–1.2)
Total Protein: 6.7 g/dL (ref 6.5–8.1)

## 2024-07-12 MED ORDER — PEGFILGRASTIM INF DEV 6 MG/0.6ML ~~LOC~~ SOSY
6.0000 mg | PREFILLED_SYRINGE | Freq: Once | SUBCUTANEOUS | Status: AC
  Administered 2024-07-12: 6 mg via SUBCUTANEOUS

## 2024-07-12 MED ORDER — LINEZOLID 600 MG PO TABS: 600.0000 mg | ORAL_TABLET | Freq: Two times a day (BID) | ORAL | 0 refills | Status: DC

## 2024-07-12 MED ORDER — AMOXICILLIN-POT CLAVULANATE 875-125 MG PO TABS: 1.0000 | ORAL_TABLET | Freq: Two times a day (BID) | ORAL | 0 refills | Status: DC

## 2024-07-12 MED ORDER — SODIUM CHLORIDE 0.9% FLUSH: 10.0000 mL | INTRAVENOUS | Status: DC | PRN

## 2024-07-12 MED ORDER — SODIUM CHLORIDE 0.9 % IV SOLN
240.0000 mg | Freq: Once | INTRAVENOUS | Status: AC
  Administered 2024-07-12: 240 mg via INTRAVENOUS
  Filled 2024-07-12: qty 24

## 2024-07-12 MED ORDER — SODIUM CHLORIDE 0.9 % IV SOLN: INTRAVENOUS | Status: DC

## 2024-07-12 NOTE — Patient Instructions (Signed)
 [facility] - A DEPT OF [facility]  Discharge Instructions: Thank you for choosing [facility] to provide your oncology and hematology care.   If you have a lab appointment with the Cancer Center, please go directly to the Cancer Center and check in at the registration area.   Wear comfortable clothing and clothing appropriate for easy access to any Portacath or PICC line.   We strive to give you quality time with your provider. You may need to reschedule your appointment if you arrive late (15 or more minutes).  Arriving late affects you and other patients whose appointments are after yours.  Also, if you miss three or more appointments without notifying the office, you may be dismissed from the clinic at the providers discretion.      For prescription refill requests, have your pharmacy contact our office and allow 72 hours for refills to be completed.    Today you received the following chemotherapy and/or immunotherapy agents opdivo  and neulasta onpro      To help prevent nausea and vomiting after your treatment, we encourage you to take your nausea medication as directed.  BELOW ARE SYMPTOMS THAT SHOULD BE REPORTED IMMEDIATELY: *FEVER GREATER THAN 100.4 F (38 C) OR HIGHER *CHILLS OR SWEATING *NAUSEA AND VOMITING THAT IS NOT CONTROLLED WITH YOUR NAUSEA MEDICATION *UNUSUAL SHORTNESS OF BREATH *UNUSUAL BRUISING OR BLEEDING *URINARY PROBLEMS (pain or burning when urinating, or frequent urination) *BOWEL PROBLEMS (unusual diarrhea, constipation, pain near the anus) TENDERNESS IN MOUTH AND THROAT WITH OR WITHOUT PRESENCE OF ULCERS (sore throat, sores in mouth, or a toothache) UNUSUAL RASH, SWELLING OR PAIN  UNUSUAL VAGINAL DISCHARGE OR ITCHING   Items with * indicate a potential emergency and should be followed up as soon as possible or go to the Emergency Department if any problems should occur.  Please show the CHEMOTHERAPY ALERT CARD or  IMMUNOTHERAPY ALERT CARD at check-in to the Emergency Department and triage nurse.  Should you have questions after your visit or need to cancel or reschedule your appointment, please contact [facility] - A DEPT OF [facility]  Dept: [phone]  and follow the prompts.  Office hours are 8:00 a.m. to 4:30 p.m. Monday - Friday. Please note that voicemails left after 4:00 p.m. may not be returned until the following business day.  We are closed weekends and major holidays. You have access to a nurse at all times for urgent questions. Please call the main number to the clinic Dept: [phone] and follow the prompts.   For any non-urgent questions, you may also contact your provider using MyChart. We now offer e-Visits for anyone 34 and older to request care online for non-urgent symptoms. For details visit [email or web address].   Also download the MyChart app! Go to the app store, search MyChart, open the app, select [facility], and log in with your MyChart username and password.

## 2024-07-16 ENCOUNTER — Encounter: Payer: Self-pay | Admitting: Internal Medicine

## 2024-07-16 ENCOUNTER — Other Ambulatory Visit: Payer: Self-pay

## 2024-07-16 ENCOUNTER — Ambulatory Visit: Payer: Self-pay | Admitting: Internal Medicine

## 2024-07-16 VITALS — BP 131/71 | HR 60 | Temp 97.6°F | Resp 16 | Wt 169.4 lb

## 2024-07-16 DIAGNOSIS — C8590 Non-Hodgkin lymphoma, unspecified, unspecified site: Secondary | ICD-10-CM

## 2024-07-16 NOTE — Progress Notes (Signed)
 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   Hypomagnesemia 06/03/2024   Hypophosphatemia 06/03/2024   Protein-calorie malnutrition, severe 06/01/2024   Chronic health problem 06/01/2024   Difficulty urinating 05/31/2024   Severe malnutrition 05/31/2024   Urinary retention 05/31/2024   Hypokalemia  Hypomagnesemia  Hypophosphatemia 05/30/2024   Mediastinal mass 05/28/2024   Transient Aphagia 05/28/2024   Paroxysmal atrial fibrillation (HCC) 05/28/2024   Stenosis of cervical spine with myelopathy (HCC) 04/18/2024   Actinic keratoses 08/09/2017   Neoplasm of uncertain behavior 08/09/2017   History of skin cancer    External hemorrhoid 06/07/2017   Neck pain 06/07/2017      Subjective:    Patient ID: Derrick Tiegs, male    DOB: 1951-01-14, 73 y.o.   MRN: [id number]  Chief Complaint  Patient presents with   Acute Visit    HPI:  Jaquavious Mercer is a 73 y.o. male here for concern of cellulitis at the neck   Had surgery there before for presumed spine infection  Since dx'ed with lymphoma  Chemo started 3 weeks ago; has a port  About 2 weeks ago noticed burning around the surgical site and then a scab  No f/c Appetite picking up  Oncology put on linezolid /augmentin  and sent to id. He is 4 days into abx   Allergies[1]    Outpatient Medications Prior to Visit  Medication Sig Dispense Refill   acetaminophen  (TYLENOL ) 500 MG tablet Take 1,000 mg by mouth See admin instructions. Take 2 tablets (1000mg ) by mouth every evening (with tramadol ) if needed for pain.     apixaban  (ELIQUIS ) 5 MG TABS tablet Take 1 tablet  (5 mg total) by mouth 2 (two) times daily. 60 tablet 0   Cholecalciferol (VITAMIN D -3 PO) Take 1 capsule by mouth daily.     Cod Liver Oil CAPS Take 1 capsule by mouth every evening.     Coenzyme Q10 (COQ10 PO) Take 1 capsule by mouth daily.     Cyanocobalamin (VITAMIN B-12 PO) Take 1 tablet by mouth daily.     dexamethasone  (DECADRON ) 4 MG tablet Take 2 tablets (8 mg) by mouth daily for 3 days starting the day after chemotherapy. Take with food. 30 tablet 1   fluorouracil (EFUDEX) 5 % cream Apply 1 application  topically 2 (two) times daily as needed (skin spots).     GARLIC PO Take 1 tablet by mouth every evening.     Ginger, Zingiber officinalis, (GINGER ROOT PO) Take 1 tablet by mouth 2 (two) times daily.     L-GLUTAMINE PO Take 1 tablet by mouth 2 (two) times daily.     lidocaine -prilocaine  (EMLA ) cream Apply to affected area once 30 g 3   metoprolol  succinate (TOPROL -XL) 100 MG 24 hr tablet Take 1 tablet (100 mg total) by mouth daily. Take with or immediately following a meal. 30 tablet 0   Multiple Vitamins-Minerals (MENS 50+ MULTIVITAMIN) TABS Take 1 tablet by mouth daily.     ondansetron  (ZOFRAN ) 8 MG tablet Take 1 tablet (8 mg total) by mouth every 8 (eight) hours as needed for nausea or vomiting. Start on the third day after chemotherapy.  30 tablet 1   pantoprazole  (PROTONIX ) 40 MG tablet Take 1 tablet (40 mg total) by mouth daily. 30 tablet 0   polyethylene glycol powder (GLYCOLAX /MIRALAX ) 17 GM/SCOOP powder Take 17 g by mouth 2 (two) times daily. Dissolve 1 capful (17g) in 4-8 ounces of liquid and take by mouth daily. 238 g 0   Probiotic Product (PROBIOTIC PO) Take 1 capsule by mouth daily.     prochlorperazine  (COMPAZINE ) 10 MG tablet Take 1 tablet (10 mg total) by mouth every 6 (six) hours as needed for nausea or vomiting. 30 tablet 1   sennosides-docusate sodium  (SENOKOT-S) 8.6-50 MG tablet Take 1 tablet by mouth every evening.     tiZANidine  (ZANAFLEX ) 4 MG capsule Take 4 mg by  mouth See admin instructions. Take 1 tablet (4mg ) by mouth every evening if needed for pain, muscle spasms.     traMADol  (ULTRAM ) 50 MG tablet Take 50 mg by mouth See admin instructions. Take 1 tablet (50mg ) by mouth every evening if needed for pain.     amoxicillin -clavulanate (AUGMENTIN ) 875-125 MG tablet Take 1 tablet by mouth 2 (two) times daily. 14 tablet 0   linezolid  (ZYVOX ) 600 MG tablet Take 1 tablet (600 mg total) by mouth 2 (two) times daily. 14 tablet 0   mirtazapine  (REMERON ) 15 MG tablet Take 1 tablet (15 mg total) by mouth at bedtime. (Patient not taking: Reported on 07/16/2024) 30 tablet 0   tamsulosin  (FLOMAX ) 0.4 MG CAPS capsule Take 1 capsule (0.4 mg total) by mouth daily. (Patient not taking: Reported on 07/16/2024) 30 capsule 0   No facility-administered medications prior to visit.     Social History   Socioeconomic History   Marital status: Married    Spouse name: Not on file   Number of children: Not on file   Years of education: Not on file   Highest education level: Not on file  Occupational History   Not on file  Tobacco Use   Smoking status: Never   Smokeless tobacco: Never  Vaping Use   Vaping status: Never Used  Substance and Sexual Activity   Alcohol use: Yes    Comment: occasionally   Drug use: No   Sexual activity: Not on file  Other Topics Concern   Not on file  Social History Narrative   Not on file   Social Drivers of Health   Tobacco Use: Low Risk (07/16/2024)   Patient History    Smoking Tobacco Use: Never    Smokeless Tobacco Use: Never    Passive Exposure: Not on file  Financial Resource Strain: Not on file  Food Insecurity: No Food Insecurity (06/13/2024)   Epic    Worried About Programme Researcher, Broadcasting/film/video in the Last Year: Never true    Ran Out of Food in the Last Year: Never true  Transportation Needs: No Transportation Needs (06/13/2024)   Epic    Lack of Transportation (Medical): No    Lack of Transportation (Non-Medical): No   Physical Activity: Not on file  Stress: Not on file  Social Connections: Moderately Integrated (05/28/2024)   Social Connection and Isolation Panel    Frequency of Communication with Friends and Family: More than three times a week    Frequency of Social Gatherings with Friends and Family: More than three times a week    Attends Religious Services: More than 4 times per year    Active Member of [employer] or Organizations: Yes    Attends Banker Meetings: More than  4 times per year    Marital Status: Divorced  Intimate Partner Violence: Not At Risk (05/28/2024)   Epic    Fear of Current or Ex-Partner: No    Emotionally Abused: No    Physically Abused: No    Sexually Abused: No  Depression (PHQ2-9): Not on file  Alcohol Screen: Not on file  Housing: Low Risk (06/13/2024)   Epic    Unable to Pay for Housing in the Last Year: No    Number of Times Moved in the Last Year: 0    Homeless in the Last Year: No  Utilities: Not At Risk (06/13/2024)   Epic    Threatened with loss of utilities: No  Health Literacy: Not on file      Review of Systems    All other ros negative  Objective:    BP 131/71   Pulse 60   Temp 97.6 F (36.4 C) (Oral)   Resp 16   Wt 169 lb 6.4 oz (76.8 kg)   SpO2 98%   BMI 22.97 kg/m  Nursing note and vital signs reviewed.  Physical Exam     General/constitutional: no distress, pleasant HEENT: Normocephalic, PER, Conj Clear, EOMI, Oropharynx clear Neck supple CV: rrr no mrg Lungs: clear to auscultation, normal respiratory effort Abd: Soft, Nontender Ext: no edema Skin: No Rash Neuro: nonfocal MSK: see pic; no tenderness   Labs: Lab Results  Component Value Date   WBC 4.2 07/12/2024   HGB 9.4 (L) 07/12/2024   HCT 29.5 (L) 07/12/2024   MCV 82.2 07/12/2024   PLT 300 07/12/2024   Last metabolic panel Lab Results  Component Value Date   GLUCOSE 123 (H) 07/12/2024   NA 137 07/12/2024   K 4.1 07/12/2024   CL 102 07/12/2024    CO2 27 07/12/2024   BUN 11 07/12/2024   CREATININE 0.57 (L) 07/12/2024   GFRNONAA >60 07/12/2024   CALCIUM 9.4 07/12/2024   PHOS 3.5 06/13/2024   PROT 6.7 07/12/2024   ALBUMIN 3.7 07/12/2024   BILITOT 0.2 07/12/2024   ALKPHOS 123 07/12/2024   AST 19 07/12/2024   ALT 21 07/12/2024   ANIONGAP 9 07/12/2024    Micro:  Serology:  Imaging:  Assessment & Plan:   Problem List Items Addressed This Visit   None     No orders of the defined types were placed in this encounter.    45 male near end of September 2025 with neck pain and thoracic/cervical spine presumed cancer s/p I&D with cx negative but on abx so presumed infection still, sp 6 weeks dapto/ceftriaxone  finished mid 05/2024, subsequently dx'ed with lymphoma  Started on chemo about 3 weeks ago  He has a scabbing and slight redness near the cervical surgical site without response to oncology started linezolid /augmentin    In hind site and I reviewed my 04/20/24 consult note when I suspected cancer --- this is likely cancer as well and he probably never had infection   We'll wait for ct scan --> if anything is there beneath the scabbing needs culture (aerobic/anaerobic/afb/fungal) and pathology   Can finish 3 more days abx (no change on abx)  Video visit in 2 weeks with me   Follow-up: Return in about 2 weeks (around 07/30/2024).      Constance ONEIDA Passer, MD [facility] 07/16/2024, 9:25 AM     [1]  Allergies Allergen Reactions   Bee Venom Anaphylaxis and Swelling   Honey Bee Venom Anaphylaxis  and Swelling   "

## 2024-07-16 NOTE — Patient Instructions (Signed)
 Finish 3 more days of your antibiotics   Please make a video visit in 2 weeks    If the redness spread quickly and there is swelling in that area that is persistent progressive along with worsening pain then let us  know   At this time, I am not sure if it's cellulitis. Underlying infection sometimes can also make the redness be persistent

## 2024-07-17 ENCOUNTER — Ambulatory Visit (HOSPITAL_COMMUNITY)
Admission: RE | Admit: 2024-07-17 | Discharge: 2024-07-17 | Disposition: A | Discharge status: Home / Self Care | Source: Ambulatory Visit | Attending: Physician Assistant | Admitting: Physician Assistant

## 2024-07-17 DIAGNOSIS — L089 Local infection of the skin and subcutaneous tissue, unspecified: Secondary | ICD-10-CM | POA: Insufficient documentation

## 2024-07-17 MED ORDER — IOHEXOL 300 MG/ML  SOLN
75.0000 mL | Freq: Once | INTRAMUSCULAR | Status: AC | PRN
  Administered 2024-07-17: 75 mL via INTRAVENOUS

## 2024-07-17 MED ORDER — HEPARIN SOD (PORK) LOCK FLUSH 100 UNIT/ML IV SOLN
500.0000 [IU] | Freq: Once | INTRAVENOUS | Status: AC
  Administered 2024-07-17: 500 [IU] via INTRAVENOUS

## 2024-07-17 MED ORDER — HEPARIN SOD (PORK) LOCK FLUSH 100 UNIT/ML IV SOLN
INTRAVENOUS | Status: AC
  Filled 2024-07-17: qty 5

## 2024-07-23 NOTE — Addendum Note (Signed)
 Addended by: ONESIMO KARST on: 07/23/2024 11:06 PM   Modules accepted: Level of Service

## 2024-07-24 ENCOUNTER — Encounter: Payer: Self-pay | Admitting: Hematology

## 2024-07-24 NOTE — Progress Notes (Signed)
 Contacted pt's daughter regarding MyChart message. Pt okay to come for tx on Friday. CT scan did not show any infection, no abscess or malignancy.  Informed pt's daughter CT scan results sent to Dr. Louis. Pt's daughter acknowledged information and verbalized understanding.

## 2024-07-26 ENCOUNTER — Inpatient Hospital Stay: Attending: Hematology

## 2024-07-26 ENCOUNTER — Other Ambulatory Visit: Payer: Self-pay | Admitting: Physician Assistant

## 2024-07-26 ENCOUNTER — Inpatient Hospital Stay

## 2024-07-26 VITALS — BP 123/55 | HR 73 | Temp 98.4°F | Resp 16 | Ht 72.0 in | Wt 170.8 lb

## 2024-07-26 DIAGNOSIS — Z5112 Encounter for antineoplastic immunotherapy: Secondary | ICD-10-CM | POA: Diagnosis present

## 2024-07-26 DIAGNOSIS — I341 Nonrheumatic mitral (valve) prolapse: Secondary | ICD-10-CM | POA: Diagnosis not present

## 2024-07-26 DIAGNOSIS — C8118 Nodular sclerosis classical Hodgkin lymphoma, lymph nodes of multiple sites: Secondary | ICD-10-CM

## 2024-07-26 DIAGNOSIS — Z79620 Long term (current) use of immunosuppressive biologic: Secondary | ICD-10-CM | POA: Insufficient documentation

## 2024-07-26 DIAGNOSIS — T451X5D Adverse effect of antineoplastic and immunosuppressive drugs, subsequent encounter: Secondary | ICD-10-CM | POA: Diagnosis not present

## 2024-07-26 DIAGNOSIS — Z5189 Encounter for other specified aftercare: Secondary | ICD-10-CM | POA: Insufficient documentation

## 2024-07-26 DIAGNOSIS — I4891 Unspecified atrial fibrillation: Secondary | ICD-10-CM | POA: Diagnosis not present

## 2024-07-26 DIAGNOSIS — Z79899 Other long term (current) drug therapy: Secondary | ICD-10-CM | POA: Diagnosis not present

## 2024-07-26 DIAGNOSIS — Z5111 Encounter for antineoplastic chemotherapy: Secondary | ICD-10-CM | POA: Insufficient documentation

## 2024-07-26 DIAGNOSIS — L089 Local infection of the skin and subcutaneous tissue, unspecified: Secondary | ICD-10-CM | POA: Insufficient documentation

## 2024-07-26 DIAGNOSIS — Z8549 Personal history of malignant neoplasm of other male genital organs: Secondary | ICD-10-CM | POA: Insufficient documentation

## 2024-07-26 DIAGNOSIS — Z7901 Long term (current) use of anticoagulants: Secondary | ICD-10-CM | POA: Diagnosis not present

## 2024-07-26 DIAGNOSIS — D701 Agranulocytosis secondary to cancer chemotherapy: Secondary | ICD-10-CM | POA: Diagnosis not present

## 2024-07-26 LAB — CMP (CANCER CENTER ONLY)
ALT: 23 U/L (ref 0–44)
AST: 21 U/L (ref 15–41)
Albumin: 4 g/dL (ref 3.5–5.0)
Alkaline Phosphatase: 136 U/L — ABNORMAL HIGH (ref 38–126)
Anion gap: 9 (ref 5–15)
BUN: 9 mg/dL (ref 8–23)
CO2: 26 mmol/L (ref 22–32)
Calcium: 8.9 mg/dL (ref 8.9–10.3)
Chloride: 105 mmol/L (ref 98–111)
Creatinine: 0.55 mg/dL — ABNORMAL LOW (ref 0.61–1.24)
GFR, Estimated: >60 mL/min
Glucose, Bld: 107 mg/dL — ABNORMAL HIGH (ref 70–99)
Potassium: 4.3 mmol/L (ref 3.5–5.1)
Sodium: 139 mmol/L (ref 135–145)
Total Bilirubin: 0.3 mg/dL (ref 0.0–1.2)
Total Protein: 6.7 g/dL (ref 6.5–8.1)

## 2024-07-26 LAB — CBC WITH DIFFERENTIAL (CANCER CENTER ONLY)
Abs Immature Granulocytes: 0.25 K/uL — ABNORMAL HIGH (ref 0.00–0.07)
Basophils Absolute: 0.1 K/uL (ref 0.0–0.1)
Basophils Relative: 0 %
Eosinophils Absolute: 0.1 K/uL (ref 0.0–0.5)
Eosinophils Relative: 1 %
HCT: 30.4 % — ABNORMAL LOW (ref 39.0–52.0)
Hemoglobin: 9.8 g/dL — ABNORMAL LOW (ref 13.0–17.0)
Immature Granulocytes: 1 %
Lymphocytes Relative: 20 %
Lymphs Abs: 3.4 K/uL (ref 0.7–4.0)
MCH: 27.6 pg (ref 26.0–34.0)
MCHC: 32.2 g/dL (ref 30.0–36.0)
MCV: 85.6 fL (ref 80.0–100.0)
Monocytes Absolute: 0.9 K/uL (ref 0.1–1.0)
Monocytes Relative: 5 %
Neutro Abs: 12.7 K/uL — ABNORMAL HIGH (ref 1.7–7.7)
Neutrophils Relative %: 73 %
Platelet Count: 189 K/uL (ref 150–400)
RBC: 3.55 MIL/uL — ABNORMAL LOW (ref 4.22–5.81)
RDW: 19.6 % — ABNORMAL HIGH (ref 11.5–15.5)
WBC Count: 17.4 K/uL — ABNORMAL HIGH (ref 4.0–10.5)
nRBC: 0.1 % (ref 0.0–0.2)

## 2024-07-26 MED ORDER — DOXORUBICIN HCL CHEMO IV INJECTION 2 MG/ML
25.0000 mg/m2 | Freq: Once | INTRAVENOUS | Status: AC
  Administered 2024-07-26: 50 mg via INTRAVENOUS
  Filled 2024-07-26: qty 25

## 2024-07-26 MED ORDER — PEGFILGRASTIM INF DEV 6 MG/0.6ML ~~LOC~~ SOSY
6.0000 mg | PREFILLED_SYRINGE | Freq: Once | SUBCUTANEOUS | Status: AC
  Administered 2024-07-26: 6 mg via SUBCUTANEOUS

## 2024-07-26 MED ORDER — PALONOSETRON HCL INJECTION 0.25 MG/5ML
0.2500 mg | Freq: Once | INTRAVENOUS | Status: AC
  Administered 2024-07-26: 0.25 mg via INTRAVENOUS
  Filled 2024-07-26: qty 5

## 2024-07-26 MED ORDER — SODIUM CHLORIDE 0.9 % IV SOLN: INTRAVENOUS | Status: DC

## 2024-07-26 MED ORDER — SODIUM CHLORIDE 0.9 % IV SOLN
375.0000 mg/m2 | Freq: Once | INTRAVENOUS | Status: AC
  Administered 2024-07-26: 800 mg via INTRAVENOUS
  Filled 2024-07-26: qty 80

## 2024-07-26 MED ORDER — APREPITANT 130 MG/18ML IV EMUL
130.0000 mg | Freq: Once | INTRAVENOUS | Status: AC
  Administered 2024-07-26: 130 mg via INTRAVENOUS
  Filled 2024-07-26: qty 18

## 2024-07-26 MED ORDER — DEXAMETHASONE SOD PHOSPHATE PF 10 MG/ML IJ SOLN
10.0000 mg | Freq: Once | INTRAMUSCULAR | Status: AC
  Administered 2024-07-26: 10 mg via INTRAVENOUS

## 2024-07-26 MED ORDER — VINBLASTINE SULFATE CHEMO INJECTION 1 MG/ML
6.0000 mg/m2 | Freq: Once | INTRAVENOUS | Status: AC
  Administered 2024-07-26: 12.2 mg via INTRAVENOUS
  Filled 2024-07-26: qty 12.2

## 2024-07-26 MED ORDER — SODIUM CHLORIDE 0.9 % IV SOLN
240.0000 mg | Freq: Once | INTRAVENOUS | Status: AC
  Administered 2024-07-26: 240 mg via INTRAVENOUS
  Filled 2024-07-26: qty 24

## 2024-07-26 NOTE — Patient Instructions (Signed)
 [facility] - A DEPT OF [facility]  Discharge Instructions: Thank you for choosing [facility] to provide your oncology and hematology care.   If you have a lab appointment with the Cancer Center, please go directly to the Cancer Center and check in at the registration area.   Wear comfortable clothing and clothing appropriate for easy access to any Portacath or PICC line.   We strive to give you quality time with your provider. You may need to reschedule your appointment if you arrive late (15 or more minutes).  Arriving late affects you and other patients whose appointments are after yours.  Also, if you miss three or more appointments without notifying the office, you may be dismissed from the clinic at the providers discretion.      For prescription refill requests, have your pharmacy contact our office and allow 72 hours for refills to be completed.    Today you received the following chemotherapy and/or immunotherapy agents doxorubicin , velban  (vinblastine ), dacarbazine (DTIC) opdivo  (nivolumab )      To help prevent nausea and vomiting after your treatment, we encourage you to take your nausea medication as directed.  BELOW ARE SYMPTOMS THAT SHOULD BE REPORTED IMMEDIATELY: *FEVER GREATER THAN 100.4 F (38 C) OR HIGHER *CHILLS OR SWEATING *NAUSEA AND VOMITING THAT IS NOT CONTROLLED WITH YOUR NAUSEA MEDICATION *UNUSUAL SHORTNESS OF BREATH *UNUSUAL BRUISING OR BLEEDING *URINARY PROBLEMS (pain or burning when urinating, or frequent urination) *BOWEL PROBLEMS (unusual diarrhea, constipation, pain near the anus) TENDERNESS IN MOUTH AND THROAT WITH OR WITHOUT PRESENCE OF ULCERS (sore throat, sores in mouth, or a toothache) UNUSUAL RASH, SWELLING OR PAIN  UNUSUAL VAGINAL DISCHARGE OR ITCHING   Items with * indicate a potential emergency and should be followed up as soon as possible or go to the Emergency Department if any problems should  occur.  Please show the CHEMOTHERAPY ALERT CARD or IMMUNOTHERAPY ALERT CARD at check-in to the Emergency Department and triage nurse.  Should you have questions after your visit or need to cancel or reschedule your appointment, please contact [facility] - A DEPT [facility]  Dept: [phone]  and follow the prompts.  Office hours are 8:00 a.m. to 4:30 p.m. Monday - Friday. Please note that voicemails left after 4:00 p.m. may not be returned until the following business day.  We are closed weekends and major holidays. You have access to a nurse at all times for urgent questions. Please call the main number to the clinic Dept: [phone] and follow the prompts.   For any non-urgent questions, you may also contact your provider using MyChart. We now offer e-Visits for anyone 75 and older to request care online for non-urgent symptoms. For details visit [email or web address].   Also download the MyChart app! Go to the app store, search MyChart, open the app, select , and log in with your MyChart username and password.

## 2024-07-26 NOTE — Progress Notes (Signed)
 Patients wound was assessed by [place] [employer] and was ok for treatnent

## 2024-08-01 ENCOUNTER — Telehealth (INDEPENDENT_AMBULATORY_CARE_PROVIDER_SITE_OTHER): Payer: Self-pay | Admitting: Infectious Diseases

## 2024-08-01 ENCOUNTER — Other Ambulatory Visit: Payer: Self-pay

## 2024-08-01 ENCOUNTER — Encounter: Payer: Self-pay | Admitting: Infectious Diseases

## 2024-08-01 DIAGNOSIS — L89100 Pressure ulcer of unspecified part of back, unstageable: Secondary | ICD-10-CM | POA: Diagnosis not present

## 2024-08-01 DIAGNOSIS — E440 Moderate protein-calorie malnutrition: Secondary | ICD-10-CM

## 2024-08-01 DIAGNOSIS — L03818 Cellulitis of other sites: Secondary | ICD-10-CM

## 2024-08-01 NOTE — Progress Notes (Signed)
 "     Patient: David [name]  DOB: 1951-02-12 MRN: [id number] PCP: [facility]  Referring Provider:   Reason for Visit: FU wound on back    I connected with David [name] on 08/01/2024 at  8:30 AM EST by VIDEO and verified that I am speaking with the correct person using two identifiers.   I discussed the limitations, risks, security and privacy concerns of performing an evaluation and management service by telephone and the availability of in person appointments. I also discussed with the patient that there may be a patient responsible charge related to this service. The patient expressed understanding and agreed to proceed.   Subjective   Subjective:   Chief Complaint  Patient presents with   Follow-up     Discussed the use of AI scribe software for clinical note transcription with the patient, who gave verbal consent to proceed.  History of Present Illness   David [name] is a 74 year old male with lymphoma who presents for a follow-up after completing oral antibiotics for a spot on his back concerning for cellulitis. He is accompanied by his daughter, David. He completed a course of oral antibiotics, linezolid  and Augmentin , for a spot on his back concerning for cellulitis. The spot developed three weeks after starting chemotherapy for lymphoma. The scab has come off, and the center of the wound appears more white. No drainage is present, but the area is painful. He has been sleeping in a recliner since his surgery and chemotherapy.  A CT scan of the spine was performed due to the proximity of the scab to a previous surgical incision from spinal decompression. 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 A PET scan on December 5th revealed active lymphoma in the neck, chest, abdomen, pelvis, and marrow space, with some reactivity in the lower cervical spine likely due to laminectomy and  post-operative changes.  He reports a return of appetite and is working on improving his nutrition. He reports that he has lost weight but is now eating better.  He mentions a change in his voice, which he has noticed since being home. He is not currently receiving radiation, only chemotherapy. He has not discussed this with his oncology team yet.         Review of Systems  Constitutional:  Positive for weight loss (appetitie has started to improve recently however). Negative for chills, diaphoresis, fever and malaise/fatigue.    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 Take 2 tablets (1000mg ) by mouth every evening (with tramadol ) if needed for pain.     apixaban  (ELIQUIS ) 5 MG TABS tablet Take 1 tablet (5 mg total) by mouth 2 (two) times daily. 60 tablet 0   Cholecalciferol (VITAMIN D -3 PO) Take 1 capsule by mouth daily.     Cod Liver Oil CAPS Take 1 capsule by mouth every evening.     Coenzyme Q10 (COQ10 PO) Take 1 capsule by mouth daily.     Cyanocobalamin (VITAMIN B-12 PO) Take 1 tablet by mouth daily.     dexamethasone  (DECADRON ) 4 MG tablet Take 2 tablets (  8 mg) by mouth daily for 3 days starting the day after chemotherapy. Take with food. 30 tablet 1   fluorouracil (EFUDEX) 5 % cream Apply 1 application  topically 2 (two) times daily as needed (skin spots).     GARLIC PO Take 1 tablet by mouth every evening.     Ginger, Zingiber officinalis, (GINGER ROOT PO) Take 1 tablet by mouth 2 (two) times daily.     L-GLUTAMINE PO Take 1 tablet by mouth 2 (two) times daily.     lidocaine -prilocaine  (EMLA ) cream Apply to affected area once 30 g 3   Multiple Vitamins-Minerals (MENS 50+ MULTIVITAMIN) TABS Take 1 tablet by  mouth daily.     ondansetron  (ZOFRAN ) 8 MG tablet Take 1 tablet (8 mg total) by mouth every 8 (eight) hours as needed for nausea or vomiting. Start on the third day after chemotherapy. 30 tablet 1   pantoprazole  (PROTONIX ) 40 MG tablet Take 1 tablet (40 mg total) by mouth daily. 30 tablet 0   Probiotic Product (PROBIOTIC PO) Take 1 capsule by mouth daily.     prochlorperazine  (COMPAZINE ) 10 MG tablet Take 1 tablet (10 mg total) by mouth every 6 (six) hours as needed for nausea or vomiting. 30 tablet 1   sennosides-docusate sodium  (SENOKOT-S) 8.6-50 MG tablet Take 1 tablet by mouth every evening.     tiZANidine  (ZANAFLEX ) 4 MG capsule Take 4 mg by mouth See admin instructions. Take 1 tablet (4mg ) by mouth every evening if needed for pain, muscle spasms.     traMADol  (ULTRAM ) 50 MG tablet Take 50 mg by mouth See admin instructions. Take 1 tablet (50mg ) by mouth every evening if needed for pain.     amoxicillin -clavulanate (AUGMENTIN ) 875-125 MG tablet Take 1 tablet by mouth 2 (two) times daily. 14 tablet 0   linezolid  (ZYVOX ) 600 MG tablet Take 1 tablet (600 mg total) by mouth 2 (two) times daily. 14 tablet 0   metoprolol  succinate (TOPROL -XL) 100 MG 24 hr tablet Take 1 tablet (100 mg total) by mouth daily. Take with or immediately following a meal. 30 tablet 0   mirtazapine  (REMERON ) 15 MG tablet Take 1 tablet (15 mg total) by mouth at bedtime. (Patient not taking: Reported on 07/16/2024) 30 tablet 0   polyethylene glycol powder (GLYCOLAX /MIRALAX ) 17 GM/SCOOP powder Take 17 g by mouth 2 (two) times daily. Dissolve 1 capful (17g) in 4-8 ounces of liquid and take by mouth daily. 238 g 0   tamsulosin  (FLOMAX ) 0.4 MG CAPS capsule Take 1 capsule (0.4 mg total) by mouth daily. (Patient not taking: Reported on 07/16/2024) 30 capsule 0   No facility-administered medications prior to visit.     Allergies  Allergen Reactions   Bee Venom Anaphylaxis and Swelling   Honey Bee Venom Anaphylaxis and Swelling     Social History   Tobacco Use   Smoking status: Never   Smokeless tobacco: Never  Vaping Use   Vaping status: Never Used  Substance Use Topics   Alcohol use: Yes    Comment: occasionally   Drug use: No      Objective   Objective:   There were no vitals filed for this visit.  There is no height or weight on file to calculate BMI.  Physical Exam Constitutional:      Appearance: Normal appearance. He is not ill-appearing.  HENT:     Head: Normocephalic.     Mouth/Throat:     Mouth: Mucous membranes are moist.  Pharynx: Oropharynx is clear.  Eyes:     General: No scleral icterus. Pulmonary:     Effort: Pulmonary effort is normal.  Musculoskeletal:        General: Normal range of motion.     Cervical back: Normal range of motion.  Skin:    Coloration: Skin is not jaundiced or pale.         Comments: Surgical incision well approximated and healed. Down further over the right scapula there is a shallow appearing ulceration noted with white/yellow appearing adherent slough from what I can tell with video quality. Redness is not significant around the area. It looks dry.   Neurological:     Mental Status: He is alert and oriented to person, place, and time.  Psychiatric:        Mood and Affect: Mood normal.        Judgment: Judgment normal.      Assessment & Plan:     Pressure ulcer of the back - The pressure ulcer on the back is likely due to prolonged pressure in the setting of malnutrtition a/w treatments and weightloss and immobility. The ulcer is not currently infected, as indicated by the absence of pain, worsening redness, or drainage.  On exam, the ulcer appears stable and slightly improved compared to previous images. There still appears to be adherent slough to the wound bed that would likely benefit from medihoney to debride. No exudate. The risk of infection remains due to the disruption of the skin barrier and changes to look out for were discussed.   - Apply Medihoney Gel to the ulcer to aid in healing and reduce slough. - Cover with silicone foam dressings to protect the ulcer from pressure and promote healing. - Referred to a wound clinic for specialized wound care management. - Encouraged nutritional support with protein supplements like Ensure or Boost to aid in tissue repair. - Advised minimizing pressure on the ulcer by adjusting seating positions and using pillows for support. - Instructed to monitor for signs of infection and report any changes via MyChart or to the oncology team.  Nutrition -  Discussed importance of essential proteins, adequate zinc /vitamin C - sounds like his diet and appetite have improved and returned after a period of significant rapid weight loss a/w chemotherapy     No orders of the defined types were placed in this encounter.   No orders of the defined types were placed in this encounter.   FU PRN   David Fireman, MSN, NP-C [facility]  [email or web address] Pager: [phone] Office: [phone] [facility] Main Line: [phone] *Secure Chat Communication Welcome   I personally spent a total of 27 minutes in the care of the patient today including 17 min face to face video visit, preparing to see the patient, reviewing notes from Dr. Overton and last ID visit, subsequent oncology visits and placing recommendations into mychart portal and coordinating referrals.   "

## 2024-08-06 NOTE — Progress Notes (Signed)
 [facility] CSW Progress Note  Patient's daughter scheduled for caregiver counseling on 1/16. Clinical Social Worker contacted patient's daughter to inform her of CSW's upcoming transition to [facility] [facility]. CSW offered to have caregiver transition to [facility] Clinical Intern or Community based OPT. Caregiver opted to cancel appointment due to her not being able to attend. Caregiver did not specify need for additional support. CSW informed caregiver of supports available and how to reach support services if needed. No additional needs.     Lizbeth Sprague, LCSW Clinical Social Worker [facility]

## 2024-08-09 ENCOUNTER — Inpatient Hospital Stay

## 2024-08-09 ENCOUNTER — Encounter: Payer: Self-pay | Admitting: Hematology

## 2024-08-09 ENCOUNTER — Inpatient Hospital Stay: Admitting: Hematology

## 2024-08-09 VITALS — BP 130/58 | HR 66 | Temp 97.3°F | Resp 17 | Wt 177.5 lb

## 2024-08-09 VITALS — BP 128/54 | HR 83 | Resp 16

## 2024-08-09 DIAGNOSIS — C8118 Nodular sclerosis classical Hodgkin lymphoma, lymph nodes of multiple sites: Secondary | ICD-10-CM

## 2024-08-09 DIAGNOSIS — Z5111 Encounter for antineoplastic chemotherapy: Secondary | ICD-10-CM

## 2024-08-09 DIAGNOSIS — Z5112 Encounter for antineoplastic immunotherapy: Secondary | ICD-10-CM | POA: Diagnosis not present

## 2024-08-09 LAB — CMP (CANCER CENTER ONLY)
ALT: 20 U/L (ref 0–44)
AST: 17 U/L (ref 15–41)
Albumin: 4 g/dL (ref 3.5–5.0)
Alkaline Phosphatase: 125 U/L (ref 38–126)
Anion gap: 10 (ref 5–15)
BUN: 10 mg/dL (ref 8–23)
CO2: 26 mmol/L (ref 22–32)
Calcium: 9 mg/dL (ref 8.9–10.3)
Chloride: 105 mmol/L (ref 98–111)
Creatinine: 0.58 mg/dL — ABNORMAL LOW (ref 0.61–1.24)
GFR, Estimated: >60 mL/min
Glucose, Bld: 121 mg/dL — ABNORMAL HIGH (ref 70–99)
Potassium: 4 mmol/L (ref 3.5–5.1)
Sodium: 140 mmol/L (ref 135–145)
Total Bilirubin: <0.2 mg/dL (ref 0.0–1.2)
Total Protein: 6.5 g/dL (ref 6.5–8.1)

## 2024-08-09 LAB — CBC WITH DIFFERENTIAL (CANCER CENTER ONLY)
Abs Immature Granulocytes: 0.18 K/uL — ABNORMAL HIGH (ref 0.00–0.07)
Basophils Absolute: 0 K/uL (ref 0.0–0.1)
Basophils Relative: 0 %
Eosinophils Absolute: 0.2 K/uL (ref 0.0–0.5)
Eosinophils Relative: 2 %
HCT: 29.2 % — ABNORMAL LOW (ref 39.0–52.0)
Hemoglobin: 9.5 g/dL — ABNORMAL LOW (ref 13.0–17.0)
Immature Granulocytes: 1 %
Lymphocytes Relative: 18 %
Lymphs Abs: 2.3 K/uL (ref 0.7–4.0)
MCH: 28.5 pg (ref 26.0–34.0)
MCHC: 32.5 g/dL (ref 30.0–36.0)
MCV: 87.7 fL (ref 80.0–100.0)
Monocytes Absolute: 0.6 K/uL (ref 0.1–1.0)
Monocytes Relative: 5 %
Neutro Abs: 9.9 K/uL — ABNORMAL HIGH (ref 1.7–7.7)
Neutrophils Relative %: 74 %
Platelet Count: 229 K/uL (ref 150–400)
RBC: 3.33 MIL/uL — ABNORMAL LOW (ref 4.22–5.81)
RDW: 21.4 % — ABNORMAL HIGH (ref 11.5–15.5)
WBC Count: 13.3 K/uL — ABNORMAL HIGH (ref 4.0–10.5)
nRBC: 0 % (ref 0.0–0.2)

## 2024-08-09 MED ORDER — SODIUM CHLORIDE 0.9 % IV SOLN: INTRAVENOUS | Status: DC

## 2024-08-09 MED ORDER — PEGFILGRASTIM (INF DEV) 6 MG/0.6ML ~~LOC~~ SOSY
6.0000 mg | PREFILLED_SYRINGE | Freq: Once | SUBCUTANEOUS | Status: AC
  Administered 2024-08-09: 6 mg via SUBCUTANEOUS

## 2024-08-09 MED ORDER — PALONOSETRON HCL INJECTION 0.25 MG/5ML
0.2500 mg | Freq: Once | INTRAVENOUS | Status: AC
  Administered 2024-08-09: 0.25 mg via INTRAVENOUS
  Filled 2024-08-09: qty 5

## 2024-08-09 MED ORDER — VINBLASTINE SULFATE CHEMO INJECTION 1 MG/ML
6.0000 mg/m2 | Freq: Once | INTRAVENOUS | Status: AC
  Administered 2024-08-09: 12.2 mg via INTRAVENOUS
  Filled 2024-08-09: qty 12.2

## 2024-08-09 MED ORDER — APREPITANT 130 MG/18ML IV EMUL
130.0000 mg | Freq: Once | INTRAVENOUS | Status: AC
  Administered 2024-08-09: 130 mg via INTRAVENOUS
  Filled 2024-08-09: qty 18

## 2024-08-09 MED ORDER — DEXAMETHASONE SOD PHOSPHATE PF 10 MG/ML IJ SOLN
10.0000 mg | Freq: Once | INTRAMUSCULAR | Status: AC
  Administered 2024-08-09: 10 mg via INTRAVENOUS
  Filled 2024-08-09: qty 1

## 2024-08-09 MED ORDER — SODIUM CHLORIDE 0.9 % IV SOLN
375.0000 mg/m2 | Freq: Once | INTRAVENOUS | Status: AC
  Administered 2024-08-09: 800 mg via INTRAVENOUS
  Filled 2024-08-09: qty 80

## 2024-08-09 MED ORDER — SODIUM CHLORIDE 0.9 % IV SOLN
240.0000 mg | Freq: Once | INTRAVENOUS | Status: AC
  Administered 2024-08-09: 240 mg via INTRAVENOUS
  Filled 2024-08-09: qty 24

## 2024-08-09 MED ORDER — DOXORUBICIN HCL CHEMO IV INJECTION 2 MG/ML
25.0000 mg/m2 | Freq: Once | INTRAVENOUS | Status: AC
  Administered 2024-08-09: 50 mg via INTRAVENOUS
  Filled 2024-08-09: qty 25

## 2024-08-09 NOTE — Progress Notes (Signed)
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 absorbable sutures and Dermabond. The vein skin site was closed using a single layer of absorbable suture and Dermabond. Sterile dressings were applied. Patient tolerated the procedure well without an immediate complication. Ultrasound and fluoroscopic images were taken and saved for this procedure. IMPRESSION: Placement of a subcutaneous power-injectable port device. Catheter tip at the superior cavoatrial junction. Electronically Signed   By: Juliene Balder M.D.   On: 06/25/2024 15:53   DG Chest 2 View Result Date: 06/11/2024 CLINICAL DATA:  Mediastinal mass. EXAM: CHEST - 2 VIEW COMPARISON:  Tray shin of dated 06/01/2024 and CT dated 05/28/2024. FINDINGS: Triangular opacity over the posterior left lung base on the lateral view may be related to atelectasis secondary to eventration of the left hemidiaphragm. Pneumonia is not excluded. No pleural effusion or pneumothorax. The cardiac silhouette is within limits. Widening of the upper mediastinum in keeping with known mediastinal mass. No acute  osseous pathology. IMPRESSION: 1. Left lung base atelectasis versus pneumonia. 2. Widening of the upper mediastinum in keeping with  known mediastinal mass. Electronically Signed   By: Vanetta Chou M.D.   On: 06/11/2024 14:16   DG Chest Port 1 View Result Date: 06/01/2024 CLINICAL DATA:  Pneumothorax. EXAM: PORTABLE CHEST 1 VIEW COMPARISON:  Radiograph yesterday. FINDINGS: Small left pneumothorax is slightly decreased in size from yesterday's exam, just above the posterior third rib. The right upper extremity PICC is been removed. Background hyperinflation and coarse lung markings are unchanged. Stable heart size and mediastinal contours. Persistent blunting of both costophrenic angles. Remote right rib fractures. IMPRESSION: 1. Small left pneumothorax is slightly decreased in size from yesterday's exam. 2. Interval removal of right upper extremity PICC. Electronically Signed   By: Andrea Gasman M.D.   On: 06/01/2024 13:04   DG Chest 2 View Result Date: 05/31/2024 EXAM: 2 VIEW(S) XRAY OF THE CHEST 05/31/2024 11:58:00 AM COMPARISON: 05/31/2024 CLINICAL HISTORY: Pneumothorax FINDINGS: LINES, TUBES AND DEVICES: Right upper extremity PICC in place with tip overlying the superior cavoatrial junction. Left chest drain removed. LUNGS AND PLEURA: Small left apical pneumothorax, visceral pleural line 1.4 cm from chest wall. Small bilateral pleural effusions. Hyperinflation and interstitial coarsening are nonspecific in this never smoker. No focal pulmonary opacity. No pulmonary edema. HEART AND MEDIASTINUM: No acute abnormality of the cardiac and mediastinal silhouettes. BONES AND SOFT TISSUES: No acute osseous abnormality. IMPRESSION: 1. 5 percent left apical pneumothorax. 2. Small bilateral pleural effusions. Electronically signed by: Rockey Kilts MD 05/31/2024 05:15 PM EST RP Workstation: [id number]   DG Chest Port 1 View Result Date: 05/31/2024 EXAM: 1 VIEW(S) XRAY OF THE CHEST 05/31/2024 06:20:00 AM COMPARISON: 05/30/2024 CLINICAL HISTORY: Pneumothorax FINDINGS: LINES, TUBES AND DEVICES: Left chest drain in place. Right upper extremity  PICC in place with tip at superior cavoatrial junction. LUNGS AND PLEURA: No focal pulmonary opacity. No pulmonary edema. No pleural effusion. No significant pneumothorax. HEART AND MEDIASTINUM: No acute abnormality of the cardiac and mediastinal silhouettes. BONES AND SOFT TISSUES: Chronic right rib fracture deformities. IMPRESSION: 1. No significant pneumothorax. 2. Left chest drain in place. Electronically signed by: Katheleen Faes MD 05/31/2024 11:00 AM EST RP Workstation: [id number]   DG Chest Port 1 View Result Date: 05/30/2024 CLINICAL DATA:  Pneumothorax. EXAM: PORTABLE CHEST 1 VIEW COMPARISON:  Chest radiograph dated 05/15/2024. FINDINGS: Right-sided PICC with tip at the cavoatrial junction. No focal consolidation or pleural effusion, pneumothorax. The cardiac silhouette is within limits. Widening of the mediastinum in keeping with known mass. No acute osseous pathology. IMPRESSION: Right-sided PICC with tip at the cavoatrial junction. No pneumothorax. Electronically Signed   By: Vanetta Chou M.D.   On: 05/30/2024 19:25   CT CHEST W CONTRAST Result Date: 05/28/2024 EXAM: CT CHEST WITH CONTRAST 05/28/2024 09:58:07 PM TECHNIQUE: CT of the chest was performed with the administration of 75 mL of iohexol  (OMNIPAQUE ) 350 MG/ML injection. Multiplanar reformatted images are provided for review. Automated exposure control, iterative reconstruction, and/or weight based adjustment of the mA/kV was utilized to reduce the radiation dose to as low as reasonably achievable. COMPARISON: None available. CLINICAL HISTORY: Mediastinal mass. FINDINGS: MEDIASTINUM: Heart and pericardium are unremarkable. The central airways are clear. There is an anterior mediastinal mass from the level of the origin of the great vessels extending to the level of the aortic valve. This measures approximately 5.0 x 8.0 x 11.4 cm. This partially encompasses the proximal right brachiocephalic and left common carotid arteries. There is  marked narrowing of the distal left brachiocephalic vein and there is also compression of the superior vena cava. Right-sided central venous catheter tip is in the distal SVC. Atherosclerotic calcifications of the aorta and iliac arteries. There is a small hiatal hernia. Appears soft tissue mass extending between the IVC and right greater saphenous artery measuring 3.5 x 2.6 cm. There are subcentimeter hypodense thyroid  nodules. There is a peripherally calcified nodule in the isthmus of the thyroid  measuring 15 mm. LYMPH NODES: Enlarged high right paratracheal lymph nodes measuring up to 10 mm short axis. Large right hilar lymph nodes measure up to 10 mm short axis. No axillary lymphadenopathy. LUNGS AND PLEURA: There is a small amount of atelectasis or scarring in both lung bases and the lingula. The lungs are otherwise clear. No pleural effusion or pneumothorax. SOFT TISSUES/BONES: Revealed right fourth and fifth rib fractures. Degenerative changes of the spine. There is some sclerosis in the lateral left fifth rib, indeterminate. No acute abnormality of the soft tissues. UPPER ABDOMEN: Limited images of the upper abdomen demonstrates gallstones are present. No other acute abnormality. IMPRESSION: 1. 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 Recommend urgent multidisciplinary evaluation (oncology/thoracic surgery) and tissue diagnosis. 2. Enlarged mediastinal lymph nodes, including high paratracheal and right hilar nodes. 3. Subcentimeter thyroid  nodules and a 15 mm peripherally calcified thyroid  isthmus nodule. Given presumed age 46, recommend non-emergent thyroid  ultrasound due to 1.5 cm nodule. 4. Indeterminate sclerosis at the lateral left fifth rib.  Electronically signed by: Greig Pique MD 05/28/2024 10:14 PM EST RP Workstation: [id number]   CT ABDOMEN PELVIS W CONTRAST Result Date: 05/28/2024 EXAM: CT ABDOMEN AND PELVIS WITH CONTRAST 05/28/2024 07:56:00 PM TECHNIQUE: CT of the abdomen and pelvis was performed with the administration of 75 mL of iohexol  (OMNIPAQUE ) 350 MG/ML injection. Multiplanar reformatted images are provided for review. Automated exposure control, iterative reconstruction, and/or weight-based adjustment of the mA/kV was utilized to reduce the radiation dose to as low as reasonably achievable. COMPARISON: None available. CLINICAL HISTORY: Metastatic disease evaluation. FINDINGS: LOWER CHEST: No acute abnormality. LIVER: The liver is unremarkable. GALLBLADDER AND BILE DUCTS: Gallbladder is unremarkable. No biliary ductal dilatation. SPLEEN: No acute abnormality. PANCREAS: No acute abnormality. ADRENAL GLANDS: No acute abnormality. KIDNEYS, URETERS AND BLADDER: There is a finding near the fundus measuring 2.5 cm. There is trabeculated bladder wall. No stones in the kidneys or ureters. No hydronephrosis. No perinephric or periureteral stranding. GI AND BOWEL: Appendix is not visualized. Stomach demonstrates no acute abnormality. There is no bowel obstruction. PERITONEUM AND RETROPERITONEUM: There is presacral edema. No ascites. No free air. VASCULATURE: Aorta is normal in caliber. LYMPH NODES: There are enlarged left periaortic lymph nodes measuring up to 15 mm short axis. There are enlarged left external iliac chain lymph nodes measuring up to 17 mm short axis. REPRODUCTIVE ORGANS: Prostate gland is prominent in size. BONES AND SOFT TISSUES: Degenerative changes affecting spine. No acute osseous abnormality. No focal soft tissue abnormality. IMPRESSION: 1. Enlarged left periaortic and left external iliac chain lymph nodes most consistent with metastatic disease. 2. Trabeculated bladder. 3. Prostatomegaly. 4. Presacral edema. Electronically  signed by: Greig Pique MD 05/28/2024 08:10 PM EST RP Workstation: [id number]   MR BRAIN WO CONTRAST Result Date: 05/28/2024 EXAM: MRI BRAIN WITHOUT CONTRAST 05/28/2024 04:33:54 PM TECHNIQUE:  Multiplanar multisequence MRI of the head/brain was performed without the administration of intravenous contrast. COMPARISON: CTA head and neck 05/28/2024. CLINICAL HISTORY: Neuro deficit, acute, stroke suspected. Generalized weakness, aphasia, and headache. FINDINGS: BRAIN AND VENTRICLES: There is no evidence of an acute infarct, mass, midline shift, hydrocephalus, or extra-axial fluid collection. There is mild cerebral atrophy. A 7 mm focus of susceptibility in the left parietal lobe may reflect a chronic hemorrhage or small cavernoma. A punctate chronic microhemorrhage is noted in the right occipital lobe. Small T2 hyperintensities in the cerebral white matter bilaterally are nonspecific but compatible with minimal chronic small vessel ischemic disease, not atypical for age. Major intracranial vascular flow voids are preserved. ORBITS: No acute abnormality. SINUSES AND MASTOIDS: No acute abnormality. BONES AND SOFT TISSUES: Normal marrow signal. No acute soft tissue abnormality. IMPRESSION: 1. Largely unremarkable appearance of the brain for age. No acute intracranial abnormality. Electronically signed by: Dasie Hamburg MD 05/28/2024 04:50 PM EST RP Workstation: [id number]   CT ANGIO HEAD NECK W WO CM Result Date: 05/28/2024 CLINICAL DATA:  Neuro deficit, acute stroke suspected EXAM: CT ANGIOGRAPHY HEAD AND NECK WITH AND WITHOUT CONTRAST TECHNIQUE: Multidetector CT imaging of the head and neck was performed using the standard protocol during bolus administration of intravenous contrast. Multiplanar CT image reconstructions and MIPs were obtained to evaluate the vascular anatomy. Carotid stenosis measurements (when applicable) are obtained utilizing NASCET criteria, using the distal internal carotid diameter as the  denominator. RADIATION DOSE REDUCTION: This exam was performed according to the departmental dose-optimization program which includes automated exposure control, adjustment of the mA and/or kV according to patient size and/or use of iterative reconstruction technique. CONTRAST:  75mL OMNIPAQUE  IOHEXOL  350 MG/ML SOLN COMPARISON:  None Available. FINDINGS: CT HEAD: Attenuation in the brain parenchyma is normal. There is no hemorrhage. No acute ischemic changes. No mass lesion. The ventricles are normal. Skull/sinuses/orbits: No significant abnormality. CTA NECK: CTA NECK Aortic arch: No proximal vessel stenosis. Right carotid: Normal Left carotid: Normal Right vertebral: Normal Left vertebral: Normal Soft tissues: No significant abnormality Other comments: There is a right arm PICC line in place. 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 The A1 segment of the anterior cerebral artery is absent. The middle cerebral artery is normal. Left anterior circulation: The internal carotid artery and middle cerebral arteries are normal. There is an azygos anterior cerebral artery. Posterior circulation: Both vertebral arteries are patent. There is no significant basilar stenosis. Both posterior cerebral arteries are patent without significant stenosis or proximal branch occlusion. No aneurysm. IMPRESSION: 1. No carotid artery stenosis 2. No significant vertebrobasilar or intracranial disease 3. 4.7 x 9.4 x 11 cm anterior mediastinal mass Electronically Signed   By: Nancyann Burns M.D.   On: 05/28/2024 15:57   DG Chest Portable 1 View Result Date: 05/15/2024 CLINICAL DATA:  AFib. EXAM: PORTABLE CHEST 1 VIEW COMPARISON:  05/14/2024 FINDINGS: Right-sided PICC line unchanged. Lungs are adequately inflated. There is no acute airspace process or effusion.  Cardiomediastinal silhouette and remainder of the exam is unchanged for IMPRESSION: No acute cardiopulmonary disease. Electronically Signed   By: Toribio Agreste M.D.   On: 05/15/2024 12:32   DG Chest Portable 1 View Result Date: 05/14/2024 CLINICAL DATA:  Bilateral ankle swelling, recent surgery, r/o effusion EXAM: PORTABLE CHEST - 1 VIEW COMPARISON:  08/30/2017 FINDINGS: Right PICC terminating  in the mid SVC. Radiopaque metallic structure overlying the left upper lung zone, likely external to the patient. No focal airspace consolidation, pleural effusion, or pneumothorax. Mild cardiomegaly. Tortuous aorta with aortic atherosclerosis. Multilevel thoracic osteophytosis. IMPRESSION: 1. Mild cardiomegaly. No pneumonia or pulmonary edema. 2. Right PICC terminates in the mid SVC. Electronically Signed   By: Rogelia Myers M.D.   On: 05/14/2024 18:36    ASSESSMENT & PLAN:  74 y.o. 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 Onesimo.  The total time spent in the appointment was *** minutes* .  All of the patient's questions were answered and the patient knows to call the clinic with any problems, questions, or concerns.  Emaline Onesimo MD MS AAHIVMS [facility] Hematology/Oncology Physician [facility]  *Total Encounter Time as defined by the Centers for Medicare and Medicaid Services includes, in addition to the face-to-face time of a patient visit  (documented in the note above) non-face-to-face time: obtaining and reviewing outside history, ordering and reviewing medications, tests or procedures, care coordination (communications with other health care professionals or caregivers) and documentation in the medical record.  I, Alan Blowers, acting as a neurosurgeon for Emaline Onesimo, MD.,have documented all relevant documentation on the behalf of Emaline Onesimo, MD,as directed by  Emaline Onesimo, MD while in the presence of Emaline Onesimo, MD.  I have reviewed the above documentation for accuracy and completeness, and I agree with the above.  Emaline Onesimo, MD     [1]  Social History Tobacco Use   Smoking status: Never   Smokeless tobacco: Never  Vaping Use   Vaping status: Never Used  Substance Use Topics   Alcohol use: Yes    Comment: occasionally   Drug use: No   "

## 2024-08-09 NOTE — Patient Instructions (Signed)
 [facility] - A DEPT OF [facility]  Discharge Instructions: Thank you for choosing [facility] to provide your oncology and hematology care.   If you have a lab appointment with the Cancer Center, please go directly to the Cancer Center and check in at the registration area.   Wear comfortable clothing and clothing appropriate for easy access to any Portacath or PICC line.   We strive to give you quality time with your provider. You may need to reschedule your appointment if you arrive late (15 or more minutes).  Arriving late affects you and other patients whose appointments are after yours.  Also, if you miss three or more appointments without notifying the office, you may be dismissed from the clinic at the providers discretion.      For prescription refill requests, have your pharmacy contact our office and allow 72 hours for refills to be completed.    Today you received the following chemotherapy and/or immunotherapy agents doxorubicin , velban  (vinblastine ), dacarbazine (DTIC) opdivo  (nivolumab )      To help prevent nausea and vomiting after your treatment, we encourage you to take your nausea medication as directed.  BELOW ARE SYMPTOMS THAT SHOULD BE REPORTED IMMEDIATELY: *FEVER GREATER THAN 100.4 F (38 C) OR HIGHER *CHILLS OR SWEATING *NAUSEA AND VOMITING THAT IS NOT CONTROLLED WITH YOUR NAUSEA MEDICATION *UNUSUAL SHORTNESS OF BREATH *UNUSUAL BRUISING OR BLEEDING *URINARY PROBLEMS (pain or burning when urinating, or frequent urination) *BOWEL PROBLEMS (unusual diarrhea, constipation, pain near the anus) TENDERNESS IN MOUTH AND THROAT WITH OR WITHOUT PRESENCE OF ULCERS (sore throat, sores in mouth, or a toothache) UNUSUAL RASH, SWELLING OR PAIN  UNUSUAL VAGINAL DISCHARGE OR ITCHING   Items with * indicate a potential emergency and should be followed up as soon as possible or go to the Emergency Department if any problems should  occur.  Please show the CHEMOTHERAPY ALERT CARD or IMMUNOTHERAPY ALERT CARD at check-in to the Emergency Department and triage nurse.  Should you have questions after your visit or need to cancel or reschedule your appointment, please contact [facility] - A DEPT [facility]  Dept: [phone]  and follow the prompts.  Office hours are 8:00 a.m. to 4:30 p.m. Monday - Friday. Please note that voicemails left after 4:00 p.m. may not be returned until the following business day.  We are closed weekends and major holidays. You have access to a nurse at all times for urgent questions. Please call the main number to the clinic Dept: [phone] and follow the prompts.   For any non-urgent questions, you may also contact your provider using MyChart. We now offer e-Visits for anyone 75 and older to request care online for non-urgent symptoms. For details visit [email or web address].   Also download the MyChart app! Go to the app store, search MyChart, open the app, select , and log in with your MyChart username and password.

## 2024-08-13 ENCOUNTER — Other Ambulatory Visit: Payer: Self-pay

## 2024-08-15 ENCOUNTER — Encounter: Payer: Self-pay | Admitting: Hematology

## 2024-08-19 ENCOUNTER — Encounter: Payer: Self-pay | Admitting: Hematology

## 2024-08-22 ENCOUNTER — Inpatient Hospital Stay

## 2024-08-22 ENCOUNTER — Inpatient Hospital Stay: Admitting: Hematology

## 2024-08-22 VITALS — BP 127/55 | HR 60 | Temp 97.2°F | Resp 20 | Wt 182.4 lb

## 2024-08-22 VITALS — BP 129/53 | HR 79 | Temp 97.8°F | Resp 18

## 2024-08-22 DIAGNOSIS — Z5111 Encounter for antineoplastic chemotherapy: Secondary | ICD-10-CM

## 2024-08-22 DIAGNOSIS — C8118 Nodular sclerosis classical Hodgkin lymphoma, lymph nodes of multiple sites: Secondary | ICD-10-CM

## 2024-08-22 DIAGNOSIS — R29898 Other symptoms and signs involving the musculoskeletal system: Secondary | ICD-10-CM | POA: Diagnosis not present

## 2024-08-22 DIAGNOSIS — Z5112 Encounter for antineoplastic immunotherapy: Secondary | ICD-10-CM | POA: Diagnosis not present

## 2024-08-22 LAB — CMP (CANCER CENTER ONLY)
ALT: 22 U/L (ref 0–44)
AST: 19 U/L (ref 15–41)
Albumin: 4.1 g/dL (ref 3.5–5.0)
Alkaline Phosphatase: 120 U/L (ref 38–126)
Anion gap: 9 (ref 5–15)
BUN: 8 mg/dL (ref 8–23)
CO2: 27 mmol/L (ref 22–32)
Calcium: 9.2 mg/dL (ref 8.9–10.3)
Chloride: 104 mmol/L (ref 98–111)
Creatinine: 0.57 mg/dL — ABNORMAL LOW (ref 0.61–1.24)
GFR, Estimated: >60 mL/min
Glucose, Bld: 114 mg/dL — ABNORMAL HIGH (ref 70–99)
Potassium: 4 mmol/L (ref 3.5–5.1)
Sodium: 140 mmol/L (ref 135–145)
Total Bilirubin: 0.2 mg/dL (ref 0.0–1.2)
Total Protein: 6.4 g/dL — ABNORMAL LOW (ref 6.5–8.1)

## 2024-08-22 LAB — CBC WITH DIFFERENTIAL (CANCER CENTER ONLY)
Abs Immature Granulocytes: 0.22 10*3/uL — ABNORMAL HIGH (ref 0.00–0.07)
Basophils Absolute: 0.1 10*3/uL (ref 0.0–0.1)
Basophils Relative: 1 %
Eosinophils Absolute: 0.1 10*3/uL (ref 0.0–0.5)
Eosinophils Relative: 1 %
HCT: 29.8 % — ABNORMAL LOW (ref 39.0–52.0)
Hemoglobin: 9.7 g/dL — ABNORMAL LOW (ref 13.0–17.0)
Immature Granulocytes: 2 %
Lymphocytes Relative: 20 %
Lymphs Abs: 2.4 10*3/uL (ref 0.7–4.0)
MCH: 28.5 pg (ref 26.0–34.0)
MCHC: 32.6 g/dL (ref 30.0–36.0)
MCV: 87.6 fL (ref 80.0–100.0)
Monocytes Absolute: 0.8 10*3/uL (ref 0.1–1.0)
Monocytes Relative: 7 %
Neutro Abs: 8.4 10*3/uL — ABNORMAL HIGH (ref 1.7–7.7)
Neutrophils Relative %: 69 %
Platelet Count: 224 10*3/uL (ref 150–400)
RBC: 3.4 MIL/uL — ABNORMAL LOW (ref 4.22–5.81)
RDW: 21.2 % — ABNORMAL HIGH (ref 11.5–15.5)
WBC Count: 12 10*3/uL — ABNORMAL HIGH (ref 4.0–10.5)
nRBC: 0 % (ref 0.0–0.2)

## 2024-08-22 LAB — TSH: TSH: 4.07 u[IU]/mL (ref 0.350–4.500)

## 2024-08-22 MED ORDER — PALONOSETRON HCL INJECTION 0.25 MG/5ML
0.2500 mg | Freq: Once | INTRAVENOUS | Status: AC
  Administered 2024-08-22: 0.25 mg via INTRAVENOUS
  Filled 2024-08-22: qty 5

## 2024-08-22 MED ORDER — SODIUM CHLORIDE 0.9 % IV SOLN
375.0000 mg/m2 | Freq: Once | INTRAVENOUS | Status: AC
  Administered 2024-08-22: 800 mg via INTRAVENOUS
  Filled 2024-08-22: qty 80

## 2024-08-22 MED ORDER — SODIUM CHLORIDE 0.9 % IV SOLN: INTRAVENOUS | Status: DC

## 2024-08-22 MED ORDER — VINBLASTINE SULFATE CHEMO INJECTION 1 MG/ML
6.0000 mg/m2 | Freq: Once | INTRAVENOUS | Status: AC
  Administered 2024-08-22: 12.2 mg via INTRAVENOUS
  Filled 2024-08-22: qty 12.2

## 2024-08-22 MED ORDER — APREPITANT 130 MG/18ML IV EMUL
130.0000 mg | Freq: Once | INTRAVENOUS | Status: AC
  Administered 2024-08-22: 130 mg via INTRAVENOUS
  Filled 2024-08-22: qty 18

## 2024-08-22 MED ORDER — SODIUM CHLORIDE 0.9 % IV SOLN
240.0000 mg | Freq: Once | INTRAVENOUS | Status: AC
  Administered 2024-08-22: 240 mg via INTRAVENOUS
  Filled 2024-08-22: qty 24

## 2024-08-22 MED ORDER — DEXAMETHASONE SOD PHOSPHATE PF 10 MG/ML IJ SOLN
10.0000 mg | Freq: Once | INTRAMUSCULAR | Status: AC
  Administered 2024-08-22: 10 mg via INTRAVENOUS
  Filled 2024-08-22: qty 1

## 2024-08-22 MED ORDER — SODIUM CHLORIDE 0.9% FLUSH: 10.0000 mL | INTRAVENOUS | Status: DC | PRN

## 2024-08-22 MED ORDER — PEGFILGRASTIM (INF DEV) 6 MG/0.6ML ~~LOC~~ SOSY
6.0000 mg | PREFILLED_SYRINGE | Freq: Once | SUBCUTANEOUS | Status: AC
  Administered 2024-08-22: 6 mg via SUBCUTANEOUS
  Filled 2024-08-22: qty 0.6

## 2024-08-22 MED ORDER — DOXORUBICIN HCL CHEMO IV INJECTION 2 MG/ML
25.0000 mg/m2 | Freq: Once | INTRAVENOUS | Status: AC
  Administered 2024-08-22: 50 mg via INTRAVENOUS
  Filled 2024-08-22: qty 25

## 2024-08-22 NOTE — Progress Notes (Signed)
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 eventration of the left hemidiaphragm. Pneumonia is not excluded. No pleural effusion or pneumothorax. The cardiac silhouette is within limits. Widening of the upper mediastinum in keeping with known mediastinal mass. No acute osseous pathology. IMPRESSION: 1. Left lung base atelectasis versus pneumonia. 2. Widening of the upper mediastinum in keeping with known mediastinal mass. Electronically Signed   By: Vanetta Chou M.D.   On: 06/11/2024 14:16   DG Chest Port 1 View Result Date: 06/01/2024 CLINICAL DATA:  Pneumothorax. EXAM: PORTABLE CHEST 1 VIEW COMPARISON:  Radiograph yesterday. FINDINGS: Small left pneumothorax is slightly decreased in size from yesterday's exam, just above the posterior third rib. The right upper extremity PICC is been removed. Background hyperinflation and coarse lung markings are unchanged. Stable heart size and mediastinal contours. Persistent blunting of both costophrenic angles. Remote right rib fractures. IMPRESSION: 1. Small left pneumothorax is slightly decreased in size from yesterday's exam. 2.  Interval removal of right upper extremity PICC. Electronically Signed   By:  Andrea Gasman M.D.   On: 06/01/2024 13:04   DG Chest 2 View Result Date: 05/31/2024 EXAM: 2 VIEW(S) XRAY OF THE CHEST 05/31/2024 11:58:00 AM COMPARISON: 05/31/2024 CLINICAL HISTORY: Pneumothorax FINDINGS: LINES, TUBES AND DEVICES: Right upper extremity PICC in place with tip overlying the superior cavoatrial junction. Left chest drain removed. LUNGS AND PLEURA: Small left apical pneumothorax, visceral pleural line 1.4 cm from chest wall. Small bilateral pleural effusions. Hyperinflation and interstitial coarsening are nonspecific in this never smoker. No focal pulmonary opacity. No pulmonary edema. HEART AND MEDIASTINUM: No acute abnormality of the cardiac and mediastinal silhouettes. BONES AND SOFT TISSUES: No acute osseous abnormality. IMPRESSION: 1. 5 percent left apical pneumothorax. 2. Small bilateral pleural effusions. Electronically signed by: Rockey Kilts MD 05/31/2024 05:15 PM EST RP Workstation: [id number]   DG Chest Port 1 View Result Date: 05/31/2024 EXAM: 1 VIEW(S) XRAY OF THE CHEST 05/31/2024 06:20:00 AM COMPARISON: 05/30/2024 CLINICAL HISTORY: Pneumothorax FINDINGS: LINES, TUBES AND DEVICES: Left chest drain in place. Right upper extremity PICC in place with tip at superior cavoatrial junction. LUNGS AND PLEURA: No focal pulmonary opacity. No pulmonary edema. No pleural effusion. No significant pneumothorax. HEART AND MEDIASTINUM: No acute abnormality of the cardiac and mediastinal silhouettes. BONES AND SOFT TISSUES: Chronic right rib fracture deformities. IMPRESSION: 1. No significant pneumothorax. 2. Left chest drain in place. Electronically signed by: Katheleen Faes MD 05/31/2024 11:00 AM EST RP Workstation: [id number]   DG Chest Port 1 View Result Date: 05/30/2024 CLINICAL DATA:  Pneumothorax. EXAM: PORTABLE CHEST 1 VIEW COMPARISON:  Chest radiograph dated 05/15/2024. FINDINGS: Right-sided PICC with tip at the cavoatrial junction. No focal consolidation or pleural effusion,  pneumothorax. The cardiac silhouette is within limits. Widening of the mediastinum in keeping with known mass. No acute osseous pathology. IMPRESSION: Right-sided PICC with tip at the cavoatrial junction. No pneumothorax. Electronically Signed   By: Vanetta Chou M.D.   On: 05/30/2024 19:25   CT CHEST W CONTRAST Result Date: 05/28/2024 EXAM: CT CHEST WITH CONTRAST 05/28/2024 09:58:07 PM TECHNIQUE: CT of the chest was performed with the administration of 75 mL of iohexol  (OMNIPAQUE ) 350 MG/ML injection. Multiplanar reformatted images are provided for review. Automated exposure control, iterative reconstruction, and/or weight based adjustment of the mA/kV was utilized to reduce the radiation dose to as low as reasonably achievable. COMPARISON: None available. CLINICAL HISTORY: Mediastinal mass. FINDINGS: MEDIASTINUM: Heart and pericardium are unremarkable. The central airways are clear. There is an anterior mediastinal mass from the level of the origin of the great vessels extending to the level of the aortic valve. This measures approximately 5.0 x 8.0 x 11.4 cm. This partially encompasses the proximal right brachiocephalic and left common carotid arteries. There is marked narrowing of the distal left brachiocephalic vein and there is also compression of the superior vena cava. Right-sided central venous catheter tip is in the distal SVC. Atherosclerotic calcifications of the aorta and iliac arteries. There is a small hiatal hernia. Appears soft tissue mass extending between the IVC and right greater saphenous artery measuring 3.5 x 2.6 cm. There are subcentimeter hypodense thyroid  nodules. There is a peripherally calcified nodule in the isthmus of the thyroid  measuring 15 mm. LYMPH NODES: Enlarged high right paratracheal lymph nodes measuring up to 10 mm short axis. Large right hilar lymph nodes measure up to 10 mm short axis. No axillary lymphadenopathy. LUNGS AND PLEURA: There is a small amount of  atelectasis or  scarring in both lung bases and the lingula. The lungs are otherwise clear. No pleural effusion or pneumothorax. SOFT TISSUES/BONES: Revealed right fourth and fifth rib fractures. Degenerative changes of the spine. There is some sclerosis in the lateral left fifth rib, indeterminate. No acute abnormality of the soft tissues. UPPER ABDOMEN: Limited images of the upper abdomen demonstrates gallstones are present. No other acute abnormality. IMPRESSION: 1. 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 Recommend urgent multidisciplinary evaluation (oncology/thoracic surgery) and tissue diagnosis. 2. Enlarged mediastinal lymph nodes, including high paratracheal and right hilar nodes. 3. Subcentimeter thyroid  nodules and a 15 mm peripherally calcified thyroid  isthmus nodule. Given presumed age 83, recommend non-emergent thyroid  ultrasound due to 1.5 cm nodule. 4. Indeterminate sclerosis at the lateral left fifth rib. Electronically signed by: Greig Pique MD 05/28/2024 10:14 PM EST RP Workstation: [id number]   CT ABDOMEN PELVIS W CONTRAST Result Date: 05/28/2024 EXAM: CT ABDOMEN AND PELVIS WITH CONTRAST 05/28/2024 07:56:00 PM TECHNIQUE: CT of the abdomen and pelvis was performed with the administration of 75 mL of iohexol  (OMNIPAQUE ) 350 MG/ML injection. Multiplanar reformatted images are provided for review. Automated exposure control, iterative reconstruction, and/or weight-based adjustment of the mA/kV was utilized to reduce the radiation dose to as low as reasonably achievable. COMPARISON: None available. CLINICAL HISTORY: Metastatic disease evaluation. FINDINGS: LOWER CHEST: No acute abnormality. LIVER: The liver is unremarkable. GALLBLADDER AND BILE  DUCTS: Gallbladder is unremarkable. No biliary ductal dilatation. SPLEEN: No acute abnormality. PANCREAS: No acute abnormality. ADRENAL GLANDS: No acute abnormality. KIDNEYS, URETERS AND BLADDER: There is a finding near the fundus measuring 2.5 cm. There is trabeculated bladder wall. No stones in the kidneys or ureters. No hydronephrosis. No perinephric or periureteral stranding. GI AND BOWEL: Appendix is not visualized. Stomach demonstrates no acute abnormality. There is no bowel obstruction. PERITONEUM AND RETROPERITONEUM: There is presacral edema. No ascites. No free air. VASCULATURE: Aorta is normal in caliber. LYMPH NODES: There are enlarged left periaortic lymph nodes measuring up to 15 mm short axis. There are enlarged left external iliac chain lymph nodes measuring up to 17 mm short axis. REPRODUCTIVE ORGANS: Prostate gland is prominent in size. BONES AND SOFT TISSUES: Degenerative changes affecting spine. No acute osseous abnormality. No focal soft tissue abnormality. IMPRESSION: 1. Enlarged left periaortic and left external iliac chain lymph nodes most consistent with metastatic disease. 2. Trabeculated bladder. 3. Prostatomegaly. 4. Presacral edema. Electronically signed by: Greig Pique MD 05/28/2024 08:10 PM EST RP Workstation: [id number]   MR BRAIN WO CONTRAST Result Date: 05/28/2024 EXAM: MRI BRAIN WITHOUT CONTRAST 05/28/2024 04:33:54 PM TECHNIQUE: Multiplanar multisequence MRI of the head/brain was performed without the administration of intravenous contrast. COMPARISON: CTA head and neck 05/28/2024. CLINICAL HISTORY: Neuro deficit, acute, stroke suspected. Generalized weakness, aphasia, and headache. FINDINGS: BRAIN AND VENTRICLES: There is no evidence of an acute infarct, mass, midline shift, hydrocephalus, or extra-axial fluid collection. There is mild cerebral atrophy. A 7 mm focus of susceptibility in the left parietal lobe may reflect a chronic hemorrhage or small cavernoma. A punctate  chronic microhemorrhage is noted in the right occipital lobe. Small T2 hyperintensities in the cerebral white matter bilaterally are nonspecific but compatible with minimal chronic small vessel ischemic disease, not atypical for age. Major intracranial vascular flow voids are preserved. ORBITS: No acute abnormality.  SINUSES AND MASTOIDS: No acute abnormality. BONES AND SOFT TISSUES: Normal marrow signal. No acute soft tissue abnormality. IMPRESSION: 1. Largely unremarkable appearance of the brain for age. No acute intracranial abnormality. Electronically signed by: Dasie Hamburg MD 05/28/2024 04:50 PM EST RP Workstation: [id number]   CT ANGIO HEAD NECK W WO CM Result Date: 05/28/2024 CLINICAL DATA:  Neuro deficit, acute stroke suspected EXAM: CT ANGIOGRAPHY HEAD AND NECK WITH AND WITHOUT CONTRAST TECHNIQUE: Multidetector CT imaging of the head and neck was performed using the standard protocol during bolus administration of intravenous contrast. Multiplanar CT image reconstructions and MIPs were obtained to evaluate the vascular anatomy. Carotid stenosis measurements (when applicable) are obtained utilizing NASCET criteria, using the distal internal carotid diameter as the denominator. RADIATION DOSE REDUCTION: This exam was performed according to the departmental dose-optimization program which includes automated exposure control, adjustment of the mA and/or kV according to patient size and/or use of iterative reconstruction technique. CONTRAST:  75mL OMNIPAQUE  IOHEXOL  350 MG/ML SOLN COMPARISON:  None Available. FINDINGS: CT HEAD: Attenuation in the brain parenchyma is normal. There is no hemorrhage. No acute ischemic changes. No mass lesion. The ventricles are normal. Skull/sinuses/orbits: No significant abnormality. CTA NECK: CTA NECK Aortic arch: No proximal vessel stenosis. Right carotid: Normal Left carotid: Normal Right vertebral: Normal Left vertebral: Normal Soft tissues: No significant abnormality Other  comments: There is a right arm PICC line in place. 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 The A1 segment of the anterior cerebral artery is absent. The middle cerebral artery is normal. Left anterior circulation: The internal carotid artery and middle cerebral arteries are normal. There is an azygos anterior cerebral artery. Posterior circulation: Both vertebral arteries are patent. There is no significant basilar stenosis. Both posterior cerebral arteries are patent without significant stenosis or proximal branch occlusion. No aneurysm. IMPRESSION: 1. No carotid artery stenosis 2. No significant vertebrobasilar or intracranial disease 3. 4.7 x 9.4 x 11 cm anterior mediastinal mass Electronically Signed   By: Nancyann Burns M.D.   On: 05/28/2024 15:57    ASSESSMENT & PLAN:   74 y.o. 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 -patient has had no new notable treatment related toxicities. -no evidence of disease progression at this time. -PET scan in 3 weeks  -MD visit in 4 weeks  Neurology referral to Dr Buckley for LUE weakness ? Neurapraxia vs radiculopathy  FOLLOW-UP in 4 weeks for labs and follow-up with Dr. Onesimo.  The total time spent in the appointment was 30 minutes* .  All of the patient's questions were answered and the patient knows to call the clinic with any problems, questions, or concerns.  Emaline Onesimo MD MS AAHIVMS [facility] Hematology/Oncology Physician [facility]  *Total Encounter Time as  defined by the Centers for Medicare and Medicaid Services includes, in addition to the face-to-face time of a patient visit (documented in the note above) non-face-to-face time: obtaining and reviewing outside history, ordering and reviewing medications, tests or procedures, care coordination (communications with other health care professionals or caregivers) and  documentation in the medical record.  I, Alan Blowers, acting as a neurosurgeon for Emaline Saran, MD.,have documented all relevant documentation on the behalf of Emaline Saran, MD,as directed by  Emaline Saran, MD while in the presence of Emaline Saran, MD.  I have reviewed the above documentation for accuracy and completeness, and I agree with the above.  Emaline Saran, MD     [1]  Social History Tobacco Use   Smoking status: Never   Smokeless tobacco: Never  Vaping Use   Vaping status: Never Used  Substance Use Topics   Alcohol use: Yes    Comment: occasionally   Drug use: No   "

## 2024-08-22 NOTE — Patient Instructions (Signed)
 [facility] - A DEPT OF [facility]  Discharge Instructions: Thank you for choosing [facility] to provide your oncology and hematology care.   If you have a lab appointment with the Cancer Center, please go directly to the Cancer Center and check in at the registration area.   Wear comfortable clothing and clothing appropriate for easy access to any Portacath or PICC line.   We strive to give you quality time with your provider. You may need to reschedule your appointment if you arrive late (15 or more minutes).  Arriving late affects you and other patients whose appointments are after yours.  Also, if you miss three or more appointments without notifying the office, you may be dismissed from the clinic at the providers discretion.      For prescription refill requests, have your pharmacy contact our office and allow 72 hours for refills to be completed.    Today you received the following chemotherapy and/or immunotherapy agents: Doxorubicin , Vinblastine , DTIC, Nivolumab       To help prevent nausea and vomiting after your treatment, we encourage you to take your nausea medication as directed.  BELOW ARE SYMPTOMS THAT SHOULD BE REPORTED IMMEDIATELY: *FEVER GREATER THAN 100.4 F (38 C) OR HIGHER *CHILLS OR SWEATING *NAUSEA AND VOMITING THAT IS NOT CONTROLLED WITH YOUR NAUSEA MEDICATION *UNUSUAL SHORTNESS OF BREATH *UNUSUAL BRUISING OR BLEEDING *URINARY PROBLEMS (pain or burning when urinating, or frequent urination) *BOWEL PROBLEMS (unusual diarrhea, constipation, pain near the anus) TENDERNESS IN MOUTH AND THROAT WITH OR WITHOUT PRESENCE OF ULCERS (sore throat, sores in mouth, or a toothache) UNUSUAL RASH, SWELLING OR PAIN  UNUSUAL VAGINAL DISCHARGE OR ITCHING   Items with * indicate a potential emergency and should be followed up as soon as possible or go to the Emergency Department if any problems should occur.  Please show the  CHEMOTHERAPY ALERT CARD or IMMUNOTHERAPY ALERT CARD at check-in to the Emergency Department and triage nurse.  Should you have questions after your visit or need to cancel or reschedule your appointment, please contact [facility] - A DE[facility]  Dept: [phone]  and follow the prompts.  Office hours are 8:00 a.m. to 4:30 p.m. Monday - Friday. Please note that voicemails left after 4:00 p.m. may not be returned until the following business day.  We are closed weekends and major holidays. You have access to a nurse at all times for urgent questions. Please call the main number to the clinic Dept: [phone] and follow the prompts.   For any non-urgent questions, you may also contact your provider using MyChart. We now offer e-Visits for anyone 85 and older to request care online for non-urgent symptoms. For details visit [email or web address].   Also download the MyChart app! Go to the app store, search MyChart, open the app, select [place], and log in with your MyChart username and password.

## 2024-08-23 LAB — T4: T4, Total: 7.8 ug/dL (ref 4.5–12.0)

## 2024-08-27 ENCOUNTER — Encounter: Payer: Self-pay | Admitting: Hematology

## 2024-08-28 ENCOUNTER — Other Ambulatory Visit: Payer: Self-pay

## 2024-09-03 ENCOUNTER — Inpatient Hospital Stay: Attending: Hematology | Admitting: Internal Medicine

## 2024-09-05 ENCOUNTER — Inpatient Hospital Stay

## 2024-09-10 ENCOUNTER — Other Ambulatory Visit (HOSPITAL_COMMUNITY)
